# Patient Record
Sex: Female | Born: 1940
Health system: Southern US, Community
[De-identification: ages and names within clinical notes are randomized; demographics above are authoritative.]

## PROBLEM LIST (undated history)

## (undated) DIAGNOSIS — E7800 Pure hypercholesterolemia, unspecified: Secondary | ICD-10-CM

## (undated) DIAGNOSIS — IMO0002 Reserved for concepts with insufficient information to code with codable children: Secondary | ICD-10-CM

## (undated) DIAGNOSIS — C50919 Malignant neoplasm of unspecified site of unspecified female breast: Secondary | ICD-10-CM

## (undated) DIAGNOSIS — R011 Cardiac murmur, unspecified: Secondary | ICD-10-CM

## (undated) DIAGNOSIS — C50419 Malignant neoplasm of upper-outer quadrant of unspecified female breast: Secondary | ICD-10-CM

## (undated) DIAGNOSIS — N63 Unspecified lump in unspecified breast: Secondary | ICD-10-CM

## (undated) DIAGNOSIS — M810 Age-related osteoporosis without current pathological fracture: Secondary | ICD-10-CM

## (undated) HISTORY — DX: Malignant neoplasm of upper-outer quadrant of unspecified female breast: C50.419

## (undated) HISTORY — DX: Unspecified lump in unspecified breast: N63.0

## (undated) HISTORY — DX: Reserved for concepts with insufficient information to code with codable children: IMO0002

## (undated) HISTORY — DX: Cardiac murmur, unspecified: R01.1

---

## 1972-10-27 DIAGNOSIS — IMO0002 Reserved for concepts with insufficient information to code with codable children: Secondary | ICD-10-CM

## 1972-10-27 HISTORY — DX: Reserved for concepts with insufficient information to code with codable children: IMO0002

## 1988-10-27 DIAGNOSIS — R011 Cardiac murmur, unspecified: Secondary | ICD-10-CM

## 1988-10-27 HISTORY — DX: Cardiac murmur, unspecified: R01.1

## 2005-12-12 ENCOUNTER — Emergency Department: Payer: Self-pay | Admitting: Emergency Medicine

## 2006-02-10 ENCOUNTER — Ambulatory Visit: Payer: Self-pay | Admitting: Family Medicine

## 2006-02-17 ENCOUNTER — Ambulatory Visit: Payer: Self-pay | Admitting: Family Medicine

## 2008-03-29 ENCOUNTER — Ambulatory Visit: Payer: Self-pay | Admitting: Family Medicine

## 2008-04-04 ENCOUNTER — Ambulatory Visit: Payer: Self-pay | Admitting: Family Medicine

## 2009-10-27 HISTORY — PX: FRACTURE SURGERY: SHX138

## 2010-07-14 ENCOUNTER — Emergency Department: Payer: Self-pay | Admitting: Emergency Medicine

## 2010-07-15 ENCOUNTER — Ambulatory Visit: Payer: Self-pay | Admitting: Unknown Physician Specialty

## 2010-07-16 ENCOUNTER — Ambulatory Visit: Payer: Self-pay | Admitting: Unknown Physician Specialty

## 2010-10-02 ENCOUNTER — Ambulatory Visit: Payer: Self-pay | Admitting: Family Medicine

## 2010-12-20 ENCOUNTER — Emergency Department: Payer: Self-pay | Admitting: Emergency Medicine

## 2011-10-28 DIAGNOSIS — C50919 Malignant neoplasm of unspecified site of unspecified female breast: Secondary | ICD-10-CM

## 2011-10-28 DIAGNOSIS — C50419 Malignant neoplasm of upper-outer quadrant of unspecified female breast: Secondary | ICD-10-CM

## 2011-10-28 HISTORY — DX: Malignant neoplasm of unspecified site of unspecified female breast: C50.919

## 2011-10-28 HISTORY — PX: BREAST SURGERY: SHX581

## 2011-10-28 HISTORY — DX: Malignant neoplasm of upper-outer quadrant of unspecified female breast: C50.419

## 2011-10-28 HISTORY — PX: MASTECTOMY: SHX3

## 2011-10-28 HISTORY — PX: OTHER SURGICAL HISTORY: SHX169

## 2012-04-15 ENCOUNTER — Ambulatory Visit: Payer: Self-pay | Admitting: Family Medicine

## 2012-04-27 ENCOUNTER — Ambulatory Visit: Payer: Self-pay | Admitting: Oncology

## 2012-05-14 DIAGNOSIS — Z170 Estrogen receptor positive status [ER+]: Secondary | ICD-10-CM | POA: Insufficient documentation

## 2012-05-27 ENCOUNTER — Ambulatory Visit: Payer: Self-pay | Admitting: General Surgery

## 2012-05-27 DIAGNOSIS — C50911 Malignant neoplasm of unspecified site of right female breast: Secondary | ICD-10-CM | POA: Insufficient documentation

## 2012-05-31 LAB — PATHOLOGY REPORT

## 2012-07-07 ENCOUNTER — Ambulatory Visit: Payer: Self-pay | Admitting: Oncology

## 2012-07-27 ENCOUNTER — Ambulatory Visit: Payer: Self-pay | Admitting: Oncology

## 2012-08-27 ENCOUNTER — Ambulatory Visit: Payer: Self-pay | Admitting: Oncology

## 2013-01-25 ENCOUNTER — Ambulatory Visit: Payer: Self-pay | Admitting: Oncology

## 2013-02-02 LAB — COMPREHENSIVE METABOLIC PANEL
Albumin: 4 g/dL (ref 3.4–5.0)
BUN: 14 mg/dL (ref 7–18)
Calcium, Total: 8.6 mg/dL (ref 8.5–10.1)
Chloride: 102 mmol/L (ref 98–107)
Creatinine: 0.9 mg/dL (ref 0.60–1.30)
EGFR (African American): >60
EGFR (Non-African Amer.): >60
Glucose: 95 mg/dL (ref 65–99)
Osmolality: 278 (ref 275–301)
SGOT(AST): 15 U/L (ref 15–37)
Total Protein: 7.3 g/dL (ref 6.4–8.2)

## 2013-02-02 LAB — CBC CANCER CENTER
Basophil %: 0.8 %
Eosinophil %: 1 %
HCT: 40.3 % (ref 35.0–47.0)
Lymphocyte %: 17.7 %
MCHC: 33.7 g/dL (ref 32.0–36.0)
MCV: 92 fL (ref 80–100)
Monocyte #: 0.6 x10 3/mm (ref 0.2–0.9)
Monocyte %: 8.8 %
Neutrophil #: 4.9 x10 3/mm (ref 1.4–6.5)
Neutrophil %: 71.7 %
Platelet: 220 x10 3/mm (ref 150–440)
RBC: 4.37 10*6/uL (ref 3.80–5.20)
RDW: 13.1 % (ref 11.5–14.5)
WBC: 6.9 x10 3/mm (ref 3.6–11.0)

## 2013-02-03 LAB — CANCER ANTIGEN 27.29: CA 27.29: 14.3 U/mL (ref 0.0–38.6)

## 2013-02-24 ENCOUNTER — Ambulatory Visit: Payer: Self-pay | Admitting: Oncology

## 2013-04-15 ENCOUNTER — Encounter: Payer: Self-pay | Admitting: General Surgery

## 2013-04-18 ENCOUNTER — Ambulatory Visit: Payer: Self-pay | Admitting: General Surgery

## 2013-04-19 ENCOUNTER — Encounter: Payer: Self-pay | Admitting: General Surgery

## 2013-04-26 ENCOUNTER — Ambulatory Visit: Payer: Self-pay | Admitting: General Surgery

## 2013-05-12 ENCOUNTER — Ambulatory Visit: Payer: Self-pay | Admitting: General Surgery

## 2013-05-18 ENCOUNTER — Encounter: Payer: Self-pay | Admitting: General Surgery

## 2013-05-18 ENCOUNTER — Ambulatory Visit (INDEPENDENT_AMBULATORY_CARE_PROVIDER_SITE_OTHER): Payer: Medicare Other | Admitting: General Surgery

## 2013-05-18 VITALS — BP 110/70 | HR 64 | Resp 12 | Ht 62.0 in | Wt 127.0 lb

## 2013-05-18 DIAGNOSIS — Z853 Personal history of malignant neoplasm of breast: Secondary | ICD-10-CM

## 2013-05-18 NOTE — Progress Notes (Addendum)
Patient ID: Rebecca Faulkner, female   DOB: 09-28-1941, 72 y.o.   MRN: [id number]  Chief Complaint  Patient presents with  . Other    mammogram    HPI Rebecca Faulkner is a 72 y.o. female. who presents for a breast evaluation. She is 25yr post right mastectomy, currently on Letrazole. The most recent mammogram was done on 04/19/13 cat 1 Patient does perform regular self breast checks and gets regular mammograms done.    HPI  Past Medical History  Diagnosis Date  . Ulcer 1974  . Heart murmur 1990  . Lump or mass in breast   . Malignant neoplasm of upper-outer quadrant of female breast 2013    R-breast    Past Surgical History  Procedure Laterality Date  . Right inguinal hernia repair  2013  . Mastectomy Right 2013  . Fracture surgery Left 2011    arm, plate and 12 screws  . Cesarean section  1969  . Breast surgery Right 2013    History reviewed. No pertinent family history.  Social History History  Substance Use Topics  . Smoking status: Never Smoker   . Smokeless tobacco: Never Used  . Alcohol Use: 25.2 oz/week    42 Cans of beer per week    Allergies  Allergen Reactions  . Codeine Nausea Only    Current Outpatient Prescriptions  Medication Sig Dispense Refill  . calcium carbonate (OS-CAL) 600 MG TABS Take 600 mg by mouth 2 (two) times daily with a meal.      . letrozole (FEMARA) 2.5 MG tablet Take 1 tablet by mouth daily.      Marland Kitchen lidocaine (LIDODERM) 5 % Place 1 patch onto the skin daily. Remove & Discard patch within 12 hours or as directed by MD      . timolol (BETIMOL) 0.5 % ophthalmic solution Place 1 drop into both eyes 2 (two) times daily.       No current facility-administered medications for this visit.    Review of Systems Review of Systems  Constitutional: Negative.   Respiratory: Negative.   Cardiovascular: Negative.     Blood pressure 110/70, pulse 64, resp. rate 12, height 5\' 2"  (1.575 m), weight 127 lb (57.607 kg).  Physical Exam Physical Exam   Constitutional: She is oriented to person, place, and time. She appears well-developed and well-nourished.  Eyes: No scleral icterus.  Neck: No tracheal deviation present. No mass and no thyromegaly present.  Cardiovascular: Normal rate, regular rhythm, intact distal pulses and normal pulses.   Pulses:      Dorsalis pedis pulses are 2+ on the right side, and 2+ on the left side.       Posterior tibial pulses are 2+ on the right side, and 2+ on the left side.  No edema   Pulmonary/Chest: Breath sounds normal. Left breast exhibits no inverted nipple, no mass, no nipple discharge, no skin change and no tenderness.  Right mastectomy site well healed, no sign of recurrent.  Lymphadenopathy:    She has no cervical adenopathy.       Right axillary: No pectoral and no lateral adenopathy present.       Left axillary: No pectoral and no lateral adenopathy present. Neurological: She is alert and oriented to person, place, and time.  Skin: Skin is dry.    Data Reviewed Mammogram reviewed  Assessment    Stable exam     Plan   Patient to return in one year with left breast mammogram.  SANKAR,SEEPLAPUTHUR G 05/18/2013, 6:59 PM

## 2013-05-18 NOTE — Patient Instructions (Addendum)
Patient to return in one year. Continue with monthly self breast exam.

## 2013-08-11 ENCOUNTER — Ambulatory Visit: Payer: Self-pay | Admitting: Oncology

## 2013-08-11 LAB — CBC CANCER CENTER
Basophil #: 0.1 x10 3/mm (ref 0.0–0.1)
Basophil %: 1.2 %
Eosinophil #: 0.1 x10 3/mm (ref 0.0–0.7)
Eosinophil %: 2.1 %
HCT: 41 % (ref 35.0–47.0)
HGB: 13.9 g/dL (ref 12.0–16.0)
Lymphocyte #: 1.2 x10 3/mm (ref 1.0–3.6)
Lymphocyte %: 24.2 %
MCHC: 34 g/dL (ref 32.0–36.0)
Monocyte #: 0.6 x10 3/mm (ref 0.2–0.9)
Neutrophil #: 3 x10 3/mm (ref 1.4–6.5)
Platelet: 228 x10 3/mm (ref 150–440)

## 2013-08-11 LAB — COMPREHENSIVE METABOLIC PANEL
Albumin: 3.9 g/dL (ref 3.4–5.0)
Alkaline Phosphatase: 75 U/L (ref 50–136)
BUN: 14 mg/dL (ref 7–18)
Bilirubin,Total: 0.5 mg/dL (ref 0.2–1.0)
Calcium, Total: 8.6 mg/dL (ref 8.5–10.1)
Chloride: 104 mmol/L (ref 98–107)
Co2: 28 mmol/L (ref 21–32)
Creatinine: 0.72 mg/dL (ref 0.60–1.30)
EGFR (African American): >60
EGFR (Non-African Amer.): >60
Glucose: 105 mg/dL — ABNORMAL HIGH (ref 65–99)
Osmolality: 282 (ref 275–301)
Potassium: 4.7 mmol/L (ref 3.5–5.1)
SGOT(AST): 16 U/L (ref 15–37)
Sodium: 141 mmol/L (ref 136–145)
Total Protein: 7.2 g/dL (ref 6.4–8.2)

## 2013-08-12 LAB — CANCER ANTIGEN 27.29: CA 27.29: 13.9 U/mL (ref 0.0–38.6)

## 2013-08-27 ENCOUNTER — Ambulatory Visit: Payer: Self-pay | Admitting: Oncology

## 2013-10-27 HISTORY — PX: VEIN SURGERY: SHX48

## 2013-11-10 ENCOUNTER — Emergency Department: Payer: Self-pay | Admitting: Emergency Medicine

## 2013-11-10 LAB — URINALYSIS, COMPLETE
BACTERIA: NONE SEEN
BILIRUBIN, UR: NEGATIVE
Blood: NEGATIVE
Glucose,UR: NEGATIVE mg/dL (ref 0–75)
Leukocyte Esterase: NEGATIVE
Nitrite: NEGATIVE
PH: 7 (ref 4.5–8.0)
Protein: NEGATIVE
RBC,UR: 4 /HPF (ref 0–5)
SPECIFIC GRAVITY: 1.014 (ref 1.003–1.030)

## 2013-11-10 LAB — CBC WITH DIFFERENTIAL/PLATELET
Basophil #: 0 10*3/uL (ref 0.0–0.1)
Basophil %: 0.9 %
Eosinophil #: 0 10*3/uL (ref 0.0–0.7)
Eosinophil %: 0.1 %
HCT: 39.6 % (ref 35.0–47.0)
HGB: 13.8 g/dL (ref 12.0–16.0)
LYMPHS ABS: 0.2 10*3/uL — AB (ref 1.0–3.6)
LYMPHS PCT: 4.1 %
MCH: 31.6 pg (ref 26.0–34.0)
MCHC: 34.9 g/dL (ref 32.0–36.0)
MCV: 91 fL (ref 80–100)
MONOS PCT: 10.1 %
Monocyte #: 0.5 x10 3/mm (ref 0.2–0.9)
Neutrophil #: 3.8 10*3/uL (ref 1.4–6.5)
Neutrophil %: 84.8 %
PLATELETS: 178 10*3/uL (ref 150–440)
RBC: 4.38 10*6/uL (ref 3.80–5.20)
RDW: 13.2 % (ref 11.5–14.5)
WBC: 4.4 10*3/uL (ref 3.6–11.0)

## 2013-11-10 LAB — COMPREHENSIVE METABOLIC PANEL
ALBUMIN: 4 g/dL (ref 3.4–5.0)
Alkaline Phosphatase: 61 U/L
Anion Gap: 8 (ref 7–16)
BUN: 9 mg/dL (ref 7–18)
Bilirubin,Total: 0.4 mg/dL (ref 0.2–1.0)
CALCIUM: 9.1 mg/dL (ref 8.5–10.1)
CHLORIDE: 105 mmol/L (ref 98–107)
CREATININE: 0.55 mg/dL — AB (ref 0.60–1.30)
Co2: 24 mmol/L (ref 21–32)
EGFR (African American): >60
Glucose: 115 mg/dL — ABNORMAL HIGH (ref 65–99)
Osmolality: 273 (ref 275–301)
POTASSIUM: 3.8 mmol/L (ref 3.5–5.1)
SGOT(AST): 13 U/L — ABNORMAL LOW (ref 15–37)
SGPT (ALT): 16 U/L (ref 12–78)
Sodium: 137 mmol/L (ref 136–145)
Total Protein: 7.3 g/dL (ref 6.4–8.2)

## 2013-11-10 LAB — LIPASE, BLOOD: Lipase: 149 U/L (ref 73–393)

## 2013-11-10 LAB — TROPONIN I

## 2014-02-08 ENCOUNTER — Ambulatory Visit: Payer: Self-pay | Admitting: Oncology

## 2014-02-20 LAB — COMPREHENSIVE METABOLIC PANEL
ALK PHOS: 74 U/L
ALT: 16 U/L (ref 12–78)
ANION GAP: 8 (ref 7–16)
Albumin: 4.1 g/dL (ref 3.4–5.0)
BILIRUBIN TOTAL: 0.3 mg/dL (ref 0.2–1.0)
BUN: 15 mg/dL (ref 7–18)
CHLORIDE: 102 mmol/L (ref 98–107)
CREATININE: 0.75 mg/dL (ref 0.60–1.30)
Calcium, Total: 10.2 mg/dL — ABNORMAL HIGH (ref 8.5–10.1)
Co2: 29 mmol/L (ref 21–32)
EGFR (African American): >60
GLUCOSE: 97 mg/dL (ref 65–99)
OSMOLALITY: 278 (ref 275–301)
POTASSIUM: 4.5 mmol/L (ref 3.5–5.1)
SGOT(AST): 13 U/L — ABNORMAL LOW (ref 15–37)
Sodium: 139 mmol/L (ref 136–145)
Total Protein: 7.5 g/dL (ref 6.4–8.2)

## 2014-02-20 LAB — CBC CANCER CENTER
Basophil #: 0.1 x10 3/mm (ref 0.0–0.1)
Basophil %: 0.9 %
Eosinophil #: 0.1 x10 3/mm (ref 0.0–0.7)
Eosinophil %: 0.9 %
HCT: 40.5 % (ref 35.0–47.0)
HGB: 13.5 g/dL (ref 12.0–16.0)
Lymphocyte #: 1.2 x10 3/mm (ref 1.0–3.6)
Lymphocyte %: 20.7 %
MCH: 30.7 pg (ref 26.0–34.0)
MCHC: 33.4 g/dL (ref 32.0–36.0)
MCV: 92 fL (ref 80–100)
MONO ABS: 0.5 x10 3/mm (ref 0.2–0.9)
Monocyte %: 8.4 %
Neutrophil #: 3.9 x10 3/mm (ref 1.4–6.5)
Neutrophil %: 69.1 %
PLATELETS: 257 x10 3/mm (ref 150–440)
RBC: 4.41 10*6/uL (ref 3.80–5.20)
RDW: 13.7 % (ref 11.5–14.5)
WBC: 5.7 x10 3/mm (ref 3.6–11.0)

## 2014-02-21 LAB — CANCER ANTIGEN 27.29: CA 27.29: 10.1 U/mL (ref 0.0–38.6)

## 2014-02-24 ENCOUNTER — Ambulatory Visit: Payer: Self-pay | Admitting: Oncology

## 2014-03-27 ENCOUNTER — Ambulatory Visit: Payer: Self-pay | Admitting: Family Medicine

## 2014-03-27 DIAGNOSIS — I517 Cardiomegaly: Secondary | ICD-10-CM

## 2014-03-28 ENCOUNTER — Encounter: Payer: Self-pay | Admitting: Cardiovascular Disease

## 2014-04-19 ENCOUNTER — Ambulatory Visit: Payer: Self-pay | Admitting: Oncology

## 2014-05-02 ENCOUNTER — Encounter: Payer: Self-pay | Admitting: General Surgery

## 2014-05-22 ENCOUNTER — Ambulatory Visit: Payer: Medicare Other | Admitting: General Surgery

## 2014-05-30 ENCOUNTER — Encounter: Payer: Self-pay | Admitting: General Surgery

## 2014-05-30 ENCOUNTER — Other Ambulatory Visit: Payer: Medicare Other

## 2014-05-30 ENCOUNTER — Ambulatory Visit (INDEPENDENT_AMBULATORY_CARE_PROVIDER_SITE_OTHER): Payer: Medicare Other | Admitting: General Surgery

## 2014-05-30 VITALS — BP 130/72 | HR 66 | Resp 12 | Ht 62.5 in | Wt 118.0 lb

## 2014-05-30 DIAGNOSIS — Z853 Personal history of malignant neoplasm of breast: Secondary | ICD-10-CM

## 2014-05-30 NOTE — Patient Instructions (Signed)
Patient to return in 3 months for follow up. Continue self breast exams. Call office for any new breast issues or concerns.  

## 2014-05-30 NOTE — Progress Notes (Signed)
Patient ID: Rebecca Faulkner, female   DOB: 1941/09/26, 73 y.o.   MRN: [id number]  Chief Complaint  Patient presents with  . Follow-up    1 year left mammogram    HPI Rebecca Faulkner is a 73 y.o. female who presents for breast cancer follow up. The most recent mammogram was done on 04/19/14. Patient does perform regular self breast checks and gets regular mammograms done. No complaints at this time.    HPI  Past Medical History  Diagnosis Date  . Ulcer 1974  . Heart murmur 1990  . Lump or mass in breast   . Malignant neoplasm of upper-outer quadrant of female breast 2013    R-breast T1, NO, MO, ER/PR pos., Her 2 neg.    Past Surgical History  Procedure Laterality Date  . Right inguinal hernia repair  2013  . Mastectomy Right 2013  . Fracture surgery Left 2011    arm, plate and 12 screws  . Cesarean section  1969  . Breast surgery Right 2013  . Vein surgery Left 2015    History reviewed. No pertinent family history.  Social History History  Substance Use Topics  . Smoking status: Never Smoker   . Smokeless tobacco: Never Used  . Alcohol Use: 25.2 oz/week    42 Cans of beer per week    Allergies  Allergen Reactions  . Codeine Nausea Only    Current Outpatient Prescriptions  Medication Sig Dispense Refill  . calcium carbonate (OS-CAL) 600 MG TABS Take 600 mg by mouth 2 (two) times daily with a meal.      . letrozole (FEMARA) 2.5 MG tablet Take 1 tablet by mouth daily.      Marland Kitchen lidocaine (LIDODERM) 5 % Place 1 patch onto the skin daily. Remove & Discard patch within 12 hours or as directed by MD      . timolol (BETIMOL) 0.5 % ophthalmic solution Place 1 drop into both eyes 2 (two) times daily.       No current facility-administered medications for this visit.    Review of Systems Review of Systems  Blood pressure 130/72, pulse 66, resp. rate 12, height 5' 2.5" (1.588 m), weight 118 lb (53.524 kg).  Physical Exam Physical Exam  Constitutional: She is oriented  to person, place, and time. She appears well-developed and well-nourished.  Neck: Neck supple. No thyromegaly present.  Cardiovascular: Normal rate, regular rhythm and normal heart sounds.   No murmur heard. Pulmonary/Chest: Effort normal and breath sounds normal. Left breast exhibits no inverted nipple, no mass, no nipple discharge, no skin change and no tenderness.  Right mastectomy site well healed no signs of reoccurrence.   Lymphadenopathy:    She has no cervical adenopathy.    She has axillary adenopathy.       Right axillary: Pectoral adenopathy present. No lateral adenopathy present.       Left axillary: No pectoral and no lateral adenopathy present. 1.5 cm mobile node in the right axillary.   Neurological: She is alert and oriented to person, place, and time.  Skin: Skin is warm and dry.    Data Reviewed Mammogram report-stable left mammogram  Assessment    2 yrs post right mastectomy/SN biopsy for T1,N0 CA, Er/PR pos, Her 2 neg. Now on Letrazole. New small palpable node in right axila. The ultrasound of the axilla shows a benign appearing lymph node.     Plan    Likely a benign finding in the right axilla. Given the  previous history of right breast cancer recommend 3 month follow up. Discussed fully with pt.       SANKAR,SEEPLAPUTHUR G 05/31/2014, 2:07 PM

## 2014-05-31 ENCOUNTER — Encounter: Payer: Self-pay | Admitting: General Surgery

## 2014-07-18 ENCOUNTER — Ambulatory Visit: Payer: Medicare Other | Admitting: General Surgery

## 2014-08-08 ENCOUNTER — Ambulatory Visit: Payer: Self-pay | Admitting: Family Medicine

## 2014-08-22 ENCOUNTER — Ambulatory Visit: Payer: Self-pay | Admitting: Oncology

## 2014-08-22 LAB — COMPREHENSIVE METABOLIC PANEL
ALBUMIN: 3.9 g/dL (ref 3.4–5.0)
AST: 24 U/L (ref 15–37)
Alkaline Phosphatase: 79 U/L
Anion Gap: 7 (ref 7–16)
BUN: 11 mg/dL (ref 7–18)
Bilirubin,Total: 0.3 mg/dL (ref 0.2–1.0)
CALCIUM: 9.1 mg/dL (ref 8.5–10.1)
Chloride: 102 mmol/L (ref 98–107)
Co2: 28 mmol/L (ref 21–32)
Creatinine: 0.57 mg/dL — ABNORMAL LOW (ref 0.60–1.30)
EGFR (Non-African Amer.): >60
Glucose: 99 mg/dL (ref 65–99)
OSMOLALITY: 273 (ref 275–301)
Potassium: 4 mmol/L (ref 3.5–5.1)
SGPT (ALT): 18 U/L
SODIUM: 137 mmol/L (ref 136–145)
TOTAL PROTEIN: 7.4 g/dL (ref 6.4–8.2)

## 2014-08-22 LAB — CBC CANCER CENTER
BASOS ABS: 0.1 x10 3/mm (ref 0.0–0.1)
Basophil %: 1 %
Eosinophil #: 0.1 x10 3/mm (ref 0.0–0.7)
Eosinophil %: 1.1 %
HCT: 41 % (ref 35.0–47.0)
HGB: 13.6 g/dL (ref 12.0–16.0)
LYMPHS PCT: 28.6 %
Lymphocyte #: 1.7 x10 3/mm (ref 1.0–3.6)
MCH: 30.9 pg (ref 26.0–34.0)
MCHC: 33.2 g/dL (ref 32.0–36.0)
MCV: 93 fL (ref 80–100)
MONO ABS: 0.7 x10 3/mm (ref 0.2–0.9)
MONOS PCT: 11.6 %
Neutrophil #: 3.4 x10 3/mm (ref 1.4–6.5)
Neutrophil %: 57.7 %
Platelet: 254 x10 3/mm (ref 150–440)
RBC: 4.4 10*6/uL (ref 3.80–5.20)
RDW: 13.3 % (ref 11.5–14.5)
WBC: 5.8 x10 3/mm (ref 3.6–11.0)

## 2014-08-23 LAB — CANCER ANTIGEN 27.29: CA 27.29: 14.3 U/mL (ref 0.0–38.6)

## 2014-08-27 ENCOUNTER — Ambulatory Visit: Payer: Self-pay | Admitting: Oncology

## 2014-08-28 ENCOUNTER — Encounter: Payer: Self-pay | Admitting: General Surgery

## 2014-08-29 ENCOUNTER — Ambulatory Visit (INDEPENDENT_AMBULATORY_CARE_PROVIDER_SITE_OTHER): Payer: Medicare Other | Admitting: General Surgery

## 2014-08-29 ENCOUNTER — Other Ambulatory Visit: Payer: Medicare Other

## 2014-08-29 ENCOUNTER — Encounter: Payer: Self-pay | Admitting: General Surgery

## 2014-08-29 VITALS — BP 124/82 | HR 70 | Resp 14 | Ht 61.0 in | Wt 122.0 lb

## 2014-08-29 DIAGNOSIS — R590 Localized enlarged lymph nodes: Secondary | ICD-10-CM

## 2014-08-29 DIAGNOSIS — C50411 Malignant neoplasm of upper-outer quadrant of right female breast: Secondary | ICD-10-CM

## 2014-08-29 NOTE — Patient Instructions (Signed)
Colonoscopy  A colonoscopy is an exam to look at the entire large intestine (colon). This exam can help find problems such as tumors, polyps, inflammation, and areas of bleeding. The exam takes about 1 hour.   LET YOUR HEALTH CARE PROVIDER KNOW ABOUT:   · Any allergies you have.  · All medicines you are taking, including vitamins, herbs, eye drops, creams, and over-the-counter medicines.  · Previous problems you or members of your family have had with the use of anesthetics.  · Any blood disorders you have.  · Previous surgeries you have had.  · Medical conditions you have.  RISKS AND COMPLICATIONS   Generally, this is a safe procedure. However, as with any procedure, complications can occur. Possible complications include:  · Bleeding.  · Tearing or rupture of the colon wall.  · Reaction to medicines given during the exam.  · Infection (rare).  BEFORE THE PROCEDURE   · Ask your health care provider about changing or stopping your regular medicines.  · You may be prescribed an oral bowel prep. This involves drinking a large amount of medicated liquid, starting the day before your procedure. The liquid will cause you to have multiple loose stools until your stool is almost clear or light green. This cleans out your colon in preparation for the procedure.  · Do not eat or drink anything else once you have started the bowel prep, unless your health care provider tells you it is safe to do so.  · Arrange for someone to drive you home after the procedure.  PROCEDURE   · You will be given medicine to help you relax (sedative).  · You will lie on your side with your knees bent.  · A long, flexible tube with a light and camera on the end (colonoscope) will be inserted through the rectum and into the colon. The camera sends video back to a computer screen as it moves through the colon. The colonoscope also releases carbon dioxide gas to inflate the colon. This helps your health care provider see the area better.  · During  the exam, your health care provider may take a small tissue sample (biopsy) to be examined under a microscope if any abnormalities are found.  · The exam is finished when the entire colon has been viewed.  AFTER THE PROCEDURE   · Do not drive for 24 hours after the exam.  · You may have a small amount of blood in your stool.  · You may pass moderate amounts of gas and have mild abdominal cramping or bloating. This is caused by the gas used to inflate your colon during the exam.  · Ask when your test results will be ready and how you will get your results. Make sure you get your test results.  Document Released: 10/10/2000 Document Revised: 08/03/2013 Document Reviewed: 06/20/2013  [employer]® Patient Information ©2015 [employer]. This information is not intended to replace advice given to you by your health care provider. Make sure you discuss any questions you have with your health care provider.

## 2014-08-29 NOTE — Progress Notes (Signed)
Patient ID: Rebecca Faulkner, female   DOB: [birth year], 73 y.o.   MRN: [id number]  Chief Complaint  Patient presents with  . Follow-up    HPI Rebecca Faulkner is a 73 y.o. female.  Follow up right axillary node and discuss having a colonoscopy. No new breast issues and she wants to wait and do the colonoscopy next year. No colonoscopy prior. Denies family history of colon cancer.   History of right breast mastectomy 2013.  HPI  Past Medical History  Diagnosis Date  . Ulcer 1974  . Heart murmur 1990  . Lump or mass in breast   . Malignant neoplasm of upper-outer quadrant of female breast 2013    R-breast T1, NO, MO, ER/PR pos., Her 2 neg.    Past Surgical History  Procedure Laterality Date  . Right inguinal hernia repair  2013  . Mastectomy Right 2013  . Fracture surgery Left 2011    arm, plate and 12 screws  . Cesarean section  1969  . Breast surgery Right 2013  . Vein surgery Left 2015    No family history on file.  Social History History  Substance Use Topics  . Smoking status: Never Smoker   . Smokeless tobacco: Never Used  . Alcohol Use: 25.2 oz/week    42 Cans of beer per week    Allergies  Allergen Reactions  . Codeine Nausea Only    Current Outpatient Prescriptions  Medication Sig Dispense Refill  . alendronate (FOSAMAX) 70 MG tablet Take 70 mg by mouth once a week.   0  . calcium carbonate (OS-CAL) 600 MG TABS Take 600 mg by mouth 2 (two) times daily with a meal.    . latanoprost (XALATAN) 0.005 % ophthalmic solution Place 1 drop into both eyes at bedtime.   2  . letrozole (FEMARA) 2.5 MG tablet Take 1 tablet by mouth daily.    Marland Kitchen lidocaine (LIDODERM) 5 % Place 1 patch onto the skin daily. Remove & Discard patch within 12 hours or as directed by MD    . timolol (BETIMOL) 0.5 % ophthalmic solution Place 1 drop into both eyes daily.      No current facility-administered medications for this visit.    Review of Systems Review of Systems  Constitutional:  Negative.   Respiratory: Negative.   Cardiovascular: Negative.     Blood pressure 124/82, pulse 70, resp. rate 14, height 5\' 1"  (1.549 m), weight 122 lb (55.339 kg).  Physical Exam Physical Exam  Constitutional: She is oriented to person, place, and time. She appears well-developed and well-nourished.  Eyes: Conjunctivae are normal. No scleral icterus.  Neck: Neck supple.  Pulmonary/Chest: Left breast exhibits no inverted nipple, no mass, no nipple discharge, no skin change and no tenderness.  Well healed right mastectomy site  Lymphadenopathy:    She has no cervical adenopathy.  1.5 cm size sub pectoral node on right, unchanged.  Neurological: She is alert and oriented to person, place, and time.  Skin: Skin is warm and dry.    Data Reviewed Prior US . Repeated today. The lymph node appears basically unchanged to slightly smaller  Assessment    Right  Breat cancer, now with a small right axillary node-appears benign and stable in size.     Plan     Follow up in August next year left diagnostic mammogram and office visit.        Merlyn Bollen G 08/29/2014, 7:58 PM

## 2015-01-02 ENCOUNTER — Inpatient Hospital Stay: Payer: Self-pay | Admitting: Internal Medicine

## 2015-01-02 HISTORY — PX: LEG SURGERY: SHX1003

## 2015-01-04 ENCOUNTER — Encounter
Admit: 2015-01-04 | Disposition: A | Discharge status: Home / Self Care | Payer: Self-pay | Attending: Internal Medicine | Admitting: Internal Medicine

## 2015-01-26 ENCOUNTER — Encounter
Admit: 2015-01-26 | Disposition: A | Discharge status: Home / Self Care | Payer: Self-pay | Attending: Internal Medicine | Admitting: Internal Medicine

## 2015-02-13 NOTE — Op Note (Signed)
PATIENT NAME:  Rebecca Faulkner, DAS MR#:  [id number] DATE OF BIRTH:  [birth year]  DATE OF PROCEDURE:  05/27/2012  PREOPERATIVE DIAGNOSIS: Carcinoma, right breast.   POSTOPERATIVE DIAGNOSIS: Carcinoma, right breast.   OPERATION: Right total mastectomy with sentinel node biopsy.   SURGEON: Mckinley Jewel, MD    ANESTHESIA: General.   COMPLICATIONS: None.   ESTIMATED BLOOD LOSS: Approximately 50 to 75 mL.   DRAINS: Jackson-Pratt drain.   PROCEDURE: The patient was put to sleep in the supine position on the operating table. The right breast and axilla were prepped and draped out as a sterile field. Preoperatively the patient had nuclear contrast injected for sentinel node identification. The gamma finder was used to locate a focal area of signal activity in the inferior portion of the axilla near the axillary tail of the right breast. An elliptical skin incision was then mapped out and the superior incision was then made and the skin elevated upward to expose the axillary contents first. Using the gamma finder, a single less than 1 cm size node was identified with intense signal activity. This was removed and sent for frozen section which did not reveal any gross metastatic disease. There were no other visible nodes and no signal activity in the axillary fat pad was identified after removal of the sentinel node. A superior flap was then elevated completed all the way to the infraclavicular region. Inferiorly incision was made and the inferior flap created to the upper rectus fascia and breast tissue along with the pectoral fascia was removed from the medial to the lateral aspect overlying the pectoral muscle. The lateral edges were then freed and removed. The lateral end of the breast was tagged for identification and orientation. The bleeding was controlled with cautery and ligatures of 3-0 Vicryl. Jackson-Pratt drain was positioned across the wound and brought out through a stab incision inferiorly and  fastened to the skin with a nylon stitch. The skin and subcutaneous tissue was approximated with two running sutures of 2-0 Vicryl. The skin was covered with Dermabond. Fluffs were then placed and held in position with the use of a surgical bra. The procedure was well tolerated. The patient subsequently was returned to the recovery room in stable condition.   ____________________________ S.Robinette Haines, MD sgs:drc D: 05/28/2012 12:12:34 ET T: 05/28/2012 12:45:44 ET JOB#: [id number]  cc: Synthia Innocent. Jamal Collin, MD, <Dictator> [facility] Robinette Haines MD ELECTRONICALLY SIGNED 05/28/2012 14:47

## 2015-02-25 NOTE — H&P (Signed)
PATIENT NAME:  Rebecca Faulkner, AGRESTI MR#:  [id number] DATE OF BIRTH:  13-Oct-1941  DATE OF ADMISSION:  01/02/2015  PRIMARY CARE PHYSICIAN:   Steele Sizer, MD   REFERRING PHYSICIAN:  Hinda Kehr, MD   CHIEF COMPLAINT: Fall today and right hip pain.   HISTORY OF PRESENT ILLNESS:  A 74 year old Caucasian female with a history of breast cancer who presented to the ED with the above chief complaint. The patient fell at the workplace and had right hip pain, but the patient denies any syncope or loss of consciousness, denies any headache or dizziness, or any other part injury. The patient's x-ray showed right hip fracture. The patient denies any other symptoms.   PAST MEDICAL HISTORY: Left-sided breast cancer, status post left mastectomy.   SOCIAL HISTORY: No smoking or drinking or illicit drugs.   PAST SURGICAL HISTORY: Hernia repair, left mastectomy, surgery of a left proximal humerus, C-section.   FAMILY HISTORY: Brother has diabetes, no hypertension, or heart attack in her family, no cancer in her family.   ALLERGIES:  CODEINE.   HOME MEDICATIONS:  Xalatan 0.005% ophthalmic solution 1 drop to each effected eye once a day in the evening, Femara 2.5 mg 1 tablet at bedtime, calcium 600+ D 1 tablet once a day at bedtime, alendronate 70 mg p.o. once a week.   REVIEW OF SYSTEMS:  CONSTITUTIONAL: The patient denies any fever or chills. No headache or dizziness weakness.  EYES: No double vision or blurred vision.  EARS, NOSE, AND THROAT: No postnasal drip, slurred speech or dysphagia.  CARDIOVASCULAR: No chest pain, palpitation. No orthopnea or nocturnal dyspnea. No leg edema.  PULMONARY: No cough, sputum, shortness of breath, or hemoptysis.  GASTROINTESTINAL: No abdominal pain, nausea, vomiting, or diarrhea. No melena or bloody stool.  GENITOURINARY: No dysuria, hematuria, or incontinence.   SKIN: No rash or jaundice.  NEUROLOGY: No syncope, loss of consciousness, or seizure.  ENDOCRINE: No  polyuria, polydipsia, heat or cold intolerance.  HEMATOLOGY:  No easy bleeding or bleeding.   PHYSICAL EXAMINATION:  VITAL SIGNS: Temperature 98.7, blood pressure 142/76, pulse 65, oxygen saturation 100% on room air.  GENERAL: The patient is alert, awake, oriented, in no acute distress.  HEENT: Pupils round, equal, reactive to light and accommodation. Moist oral mucosa. Clear oropharynx.  NECK: Supple. No JVD or carotid bruit. No lymphadenopathy. No thyromegaly.  CARDIOVASCULAR: S1, S2 regular rate and rhythm. No murmurs or gallops.  PULMONARY: Bilateral air entry. No wheezing or rales. No use of accessory muscle to breathe.  ABDOMEN: Soft. No distention or tenderness. No organomegaly. Bowel sounds present.  EXTREMITIES: No edema, clubbing or cyanosis. No calf tenderness. Bilateral pedal pulses present, right lower extremity, left side rotator deformity, unable to move at all.  SKIN: No rash or jaundice.  NEUROLOGIC: A and O x 3. No focal intracranial deficit. No focal deficit.   LABORATORY DATA: Glucose 133, BUN 14, creatinine 0.51, sodium 131, potassium 3.6, chloride 98, bicarbonate 23. CBC normal.  Right femur x-ray shows comminuted right intertrochanteric femur fracture.  There is varus impaction and a displaced butterfly fragment of lesser trochanter.    Chest x-ray:  No active disease.  PTT 24.9.  Lipase is 31, magnesium 1.8. INR 0.9, troponin less than 0.03.   IMPRESSION:  1.  Right hip fracture.  2.  Hyponatremia.   3.  History of left breast cancer status post mastectomy.   PLAN OF TREATMENT:   1.  The patient will be admitted to orthopedic surgical floor  and we will request Dr. Mack Guise consult for possible surgery.  2.  The patient has low to moderate risk for hip fracture surgery.  The patient needs deep vein thrombosis prophylaxis after surgery.    3.  I advised the patient to take deep breaths after surgery. 4.  For hyponatremia, I will start normal saline and follow up a  BMP.  5.  I discussed the patient's condition and plan of treatment with the patient.  6.  The patient wants FULL CODE.    TIME SPENT: About 53 minutes.     ____________________________ Demetrios Loll, MD qc:DT D: 01/02/2015 16:11:20 ET T: 01/02/2015 16:56:23 ET JOB#: [id number]  cc: Demetrios Loll, MD, <Dictator> Demetrios Loll MD ELECTRONICALLY SIGNED 01/03/2015 16:18

## 2015-02-25 NOTE — Discharge Summary (Signed)
PATIENT NAME:  Rebecca Faulkner, Rebecca Faulkner MR#:  [id number] DATE OF BIRTH:  02/10/1941  DATE OF ADMISSION:  01/02/2015 DATE OF DISCHARGE:  01/06/2015  DISCHARGE DIAGNOSES: Right hip fracture.   CONDITION: Satisfactory.  CODE STATUS: Full code.   HOME MEDICATIONS: Please refer to the medication reconciliation list.   DIET: Low-sodium diet.   ACTIVITY: As tolerated.   FOLLOWUP CARE: Follow with PCP also within 1-2 weeks.   REASON FOR ADMISSION: Fall and right hip pain.   HOSPITAL COURSE: The patient is a 74 year old Caucasian female with a history of breast cancer, presented in the ED with a fall and right hip pain. For detailed history and physical examination, please refer to the admission note dictated by me. Chest x-ray was negative for active disease. The patient's right hip x-ray showed right hip fracture. After admission, Dr. Mack Guise did an internal fixation of her right hip fracture. The patient started physical therapy after surgery. In addition, the patient had Lovenox for deep vein thrombosis prophylaxis. The patient has no complaints. Her vital signs are stable. She is being discharged to a skilled nursing facility today. I discussed the patient's discharge plan with the patient, nurse, and social worker.   TIME SPENT: About 36 minutes     ____________________________ Demetrios Loll, MD qc:mw D: 01/06/2015 10:35:30 ET T: 01/06/2015 10:42:30 ET JOB#: [id number]  cc: Demetrios Loll, MD, <Dictator> Demetrios Loll MD ELECTRONICALLY SIGNED 01/06/2015 16:50

## 2015-02-25 NOTE — Op Note (Signed)
PATIENT NAME:  Rebecca Faulkner, Rebecca Faulkner MR#:  [id number] DATE OF BIRTH:  [birth year]  DATE OF PROCEDURE:  01/03/2015  PREOPERATIVE DIAGNOSIS: Comminuted right intertrochanteric hip fracture.   POSTOPERATIVE DIAGNOSIS: Comminuted right intertrochanteric hip fracture.   PROCEDURE: Intramedullary fixation of right intertrochanteric trochanteric hip fracture.   SURGEON: Timoteo Gaul, MD.  ANESTHESIA: Spinal.  SPECIMENS: None.   ESTIMATED BLOOD LOSS: 50 mL.   COMPLICATIONS: None.  IMPLANTS: Short Biomet Affixus 11 x 180 mm, 130-degree intramedullary rod with a 90-mm lag screw and 36-mm distal interlocking screw.   INDICATION FOR THE PROCEDURE: The patient is a 74 year old female who sustained a mechanical fall. She had a comminuted fracture of the right hip by x-ray upon presentation to the [facility] Emergency Department. The patient is an independent community ambulator at baseline and has a high level of functioning. For this reason, I had recommended intramedullary fixation for a right hip fracture. I reviewed the risks and benefits of surgery with the patient, which included infection, bleeding, nerve or blood vessel injury, malunion, nonunion, change in lower extremity rotation or leg length discrepancy. The patient may also have persistent hip pain, may develop osteoarthritis, and may require further surgery. Medical risks include, but are not limited to DVT and pulmonary embolism, myocardial infarction, stroke, pneumonia, respiratory failure and death. The patient understood these risks and signed the consent form. She also signed consent for blood transfusion if necessary. She was cleared by the hospitalist service for surgery.   PROCEDURE: The patient had her right hip marked with the word "yes" and my initials, according to the hospital's correct site of surgery protocol. She brought to the operating room where she underwent a spinal anesthetic. She was placed supine on the fracture  table. Her right leg was placed in a leg holder where gentle traction and internal rotation was applied. The left leg was placed in a hemi-lithotomy position. All bony prominences were adequately padded. Her right arm was placed over her chest and adequately padded as well. The patient was prepped and draped in a sterile fashion. A timeout was performed to verify the patient's name, date of birth, medical record number, correct site of surgery and correct procedure to be performed. It was also used to verify the patient had received the antibiotics and all appropriate instruments, implants, and radiographic studies were available in the room. Once all in attendance were in agreement, the case began.   C-arm images in the AP and lateral planes were used to confirm fracture reduction. Once adequate reduction was achieved, an incision was made in line with the femur superior to the tip of the greater trochanter. The deep fascia was incised in line with the femur with a deep #10 blade. The abductor muscle was split in line with its fibers, which allowed for palpation of the tip of the greater trochanter. A threaded drill pin was then inserted to into the tip of the greater trochanter and advanced into the proximal femur to the level of the lesser trochanter. The drill pin position was confirmed on AP and lateral C-arm images. This was then over-drilled with a proximal femoral drill. A long ball-tip guidewire was then inserted into the femoral canal. Its position was confirmed on AP and lateral C-arm images. Sequential reamers were then sent down the femur until the femur was dilated to 13 mm. This allowed for placement of an 11-mm short Affixus nail. This was slid into position over the ball-tip guidewire. The guidewire was then removed.  Through the Affixus guide arm, the drill sleeve for the lag screw was inserted. A small stab incision was made along the lateral femur to allow for the drill guide to be placed along  the lateral cortex of the femur. A drill pin was then advanced through the lateral cortex of the femur, across the fracture site and into the femoral head. Its position again was confirmed on AP and lateral C-arm images. This was measured for a depth of 90 mm. The drill pin was then over-drilled with the lag screw drilled to a depth of 90 mm and then a 90-mm lag screw was advanced by hand into position. Its final position was confirmed on AP and lateral C-arm images. A tip-apex distance of less than 25 mm was achieved. The set screw was then tightened at the top of the intramedullary rod and loosened a quarter turn. A compression wheel had been used to compress the fracture just prior to setting the set screw as traction was gently removed from the fracture site. The fracture remained well reduced. Attention was then turned to the placement of the distal interlocking screw.   A drill guide for the distal interlocking screw was then placed alongside the lateral femur through a small stab incision. The drill sleeve was placed through the Affixus guide arm. The drill was then advanced bicortically. Its depth was measured off the drill bit. A 36-mm distal interlocking screw was then advanced into position by hand. The Affixus guide arm was then removed, and final images of the intramedullary construct were taken in both the AP and lateral planes. The hardware was in good position and the fracture remained well reduced. The wounds were then copiously irrigated. The proximal incision had the deep fascia closed with an interrupted 0 Vicryl. The subcutaneous tissue was closed with 2-0 Vicryl and the skin approximated with staples. A dry sterile dressing was applied, and the patient was then transferred to a hospital bed and brought to the PACU in stable condition.  I was scrubbed and present for the entire case, and all sharp and instrument counts were correct at the conclusion of the case.  I spoke with the patient's  boss at her request postoperatively to let her know the case had gone without complication. The patient was stable in the recovery room. The patient requested I not talk to anybody else, even family members, regarding her surgery.    ____________________________ Timoteo Gaul, MD klk:jh D: 01/08/2015 12:22:51 ET T: 01/08/2015 12:49:16 ET JOB#: [id number]  cc: Timoteo Gaul, MD, <Dictator> Timoteo Gaul MD ELECTRONICALLY SIGNED 01/16/2015 9:31

## 2015-02-25 NOTE — Consult Note (Signed)
PATIENT NAME:  Rebecca Faulkner, GILLINGHAM MR#:  [id number] DATE OF BIRTH:  1941-08-26  DATE OF CONSULTATION:  01/02/2015  CONSULTING PHYSICIAN:  Timoteo Gaul, MD  REASON FOR CONSULTATION: Right intertrochanteric hip fracture.   HISTORY OF PRESENT ILLNESS: Rebecca Faulkner is a 74 year old female who sustained a fall earlier today. She had pain in the right hip after the fall and was unable to stand or ambulate. The patient denies syncope or loss of consciousness or other injuries. She states that she hit the right side of her jaw during the fall, but did not lose consciousness.   PAST MEDICAL HISTORY: Includes left-sided breast cancer and she is status post left mastectomy.   SOCIAL HISTORY: The patient denies smoking, drinking, or drug use.   PAST SURGICAL HISTORY: Includes a left mastectomy, hernia repair, surgery for a fracture of the left proximal humerus and a C-section.   ALLERGIES: CODEINE.   HOME MEDICATIONS: Include Xalatan 0.005% ophthalmic solution 1 drop to each affected eye once daily in the evening, Femara 2.5 mg once daily at bedtime, calcium 600 +D 1 tablet daily at bedtime, and alendronate 70 mg p.o. weekly.   PHYSICAL EXAMINATION:  GENERAL: The patient is lying supine in her hospital bed.  EXTREMITIES: Her right leg is in the Buck's traction. She has shortening and external rotation of the right lower extremity. The skin overlying her right hip is intact and there is no erythema, ecchymosis, or swelling. Her thigh and lower leg compartments are soft and compressible. She has no calf tenderness or lower extremity edema. She has palpable pedal pulses, intact sensation throughout the right lower extremity, and she can flex and extend her toes and dorsiflex and plantarflex her right ankle.   RADIOLOGY: X-ray films of the right hip were reviewed by me today. This shows a comminuted right intertrochanteric hip fracture. The fracture has assumed a varus position and has shortened. The greater  trochanter is also fractured and the lesser trochanter has fractured and displaced.   ASSESSMENT: Right intertrochanteric hip fracture.   PLAN: I recommended to [name] that we proceed with surgical fixation of her right hip given her high level of functioning preoperatively. I recommended intramedullary fixation for her fracture given the comminution. I explained the details of the surgery, including the postoperative course. I reviewed the risks and benefits of surgery. The risks include, but are not limited to, infection, bleeding, nerve or blood vessel injury, leg length discrepancy, change in lower extremity rotation, hardware failure, malunion, nonunion, persistent hip pain, and the need for further surgery. Medical risks include, but are not limited to,. DVT and pulmonary embolism, myocardial infarction, stroke, pneumonia, respiratory failure, and death. The patient understood these risks and wished to proceed. I marked the right hip with the word "yes" and my initials, according to the hospital's correct site of surgery protocol. The nurse will have the patient sign the consent forms for surgery, and blood transfusions if necessary. I have reviewed all radiographic studies and laboratories in preparation for the surgery. She will be n.p.o. after midnight. She has been cleared by the hospitalist service for surgery.    ____________________________ Timoteo Gaul, MD klk:LT D: 01/02/2015 18:53:00 ET T: 01/02/2015 19:38:26 ET JOB#: [id number]  cc: Timoteo Gaul, MD, <Dictator> Timoteo Gaul MD ELECTRONICALLY SIGNED 01/08/2015 12:26

## 2015-04-12 ENCOUNTER — Ambulatory Visit: Payer: Self-pay | Admitting: Family Medicine

## 2015-04-12 ENCOUNTER — Other Ambulatory Visit: Payer: Self-pay

## 2015-04-12 DIAGNOSIS — Z1231 Encounter for screening mammogram for malignant neoplasm of breast: Secondary | ICD-10-CM

## 2015-04-19 ENCOUNTER — Other Ambulatory Visit: Payer: Self-pay

## 2015-04-19 ENCOUNTER — Ambulatory Visit: Payer: Self-pay | Admitting: Oncology

## 2015-05-03 ENCOUNTER — Ambulatory Visit: Payer: Self-pay

## 2015-05-08 ENCOUNTER — Ambulatory Visit
Admission: RE | Admit: 2015-05-08 | Discharge: 2015-05-08 | Disposition: A | Discharge status: Home / Self Care | Payer: Medicare Other | Source: Ambulatory Visit | Attending: General Surgery | Admitting: General Surgery

## 2015-05-08 DIAGNOSIS — Z1231 Encounter for screening mammogram for malignant neoplasm of breast: Secondary | ICD-10-CM

## 2015-05-08 HISTORY — DX: Malignant neoplasm of unspecified site of unspecified female breast: C50.919

## 2015-05-10 ENCOUNTER — Encounter: Payer: Self-pay | Admitting: Family Medicine

## 2015-05-10 ENCOUNTER — Ambulatory Visit: Payer: Self-pay | Admitting: General Surgery

## 2015-05-10 DIAGNOSIS — Z8781 Personal history of (healed) traumatic fracture: Secondary | ICD-10-CM | POA: Insufficient documentation

## 2015-05-14 ENCOUNTER — Ambulatory Visit (INDEPENDENT_AMBULATORY_CARE_PROVIDER_SITE_OTHER): Payer: Medicare Other | Admitting: General Surgery

## 2015-05-14 VITALS — BP 122/66 | HR 70 | Resp 12 | Ht 61.0 in | Wt 124.0 lb

## 2015-05-14 DIAGNOSIS — Z853 Personal history of malignant neoplasm of breast: Secondary | ICD-10-CM | POA: Diagnosis not present

## 2015-05-14 NOTE — Progress Notes (Signed)
Patient ID: Rebecca Faulkner, female   DOB: Nov 11, 1940, 74 y.o.   MRN: [id number]  Chief Complaint  Patient presents with  . Follow-up    Mammogram    HPI Rebecca Faulkner is a 74 y.o. female who presents for a breast cancer following upThe most recent left  mammogram was done on 05/08/15.  Patient does perform regular self breast checks and gets regular mammograms done.    HPI  Past Medical History  Diagnosis Date  . Ulcer 1974  . Heart murmur 1990  . Lump or mass in breast   . Malignant neoplasm of upper-outer quadrant of female breast 2013    R-breast T1, NO, MO, ER/PR pos., Her 2 neg.  . Breast cancer 2013    right breast    Past Surgical History  Procedure Laterality Date  . Right inguinal hernia repair  2013  . Fracture surgery Left 2011    arm, plate and 12 screws  . Cesarean section  1969  . Breast surgery Right 2013  . Vein surgery Left 2015  . Mastectomy Right 2013    No family history on file.  Social History History  Substance Use Topics  . Smoking status: Never Smoker   . Smokeless tobacco: Never Used  . Alcohol Use: 25.2 oz/week    42 Cans of beer per week    Allergies  Allergen Reactions  . Codeine Nausea Only    Current Outpatient Prescriptions  Medication Sig Dispense Refill  . alendronate (FOSAMAX) 70 MG tablet Take 70 mg by mouth once a week.   0  . calcium carbonate (OS-CAL) 600 MG TABS Take 600 mg by mouth 2 (two) times daily with a meal.    . latanoprost (XALATAN) 0.005 % ophthalmic solution Place 1 drop into both eyes at bedtime.   2  . letrozole (FEMARA) 2.5 MG tablet Take 1 tablet by mouth daily.    Marland Kitchen lidocaine (LIDODERM) 5 % Place 1 patch onto the skin daily. Remove & Discard patch within 12 hours or as directed by MD    . timolol (BETIMOL) 0.5 % ophthalmic solution Place 1 drop into both eyes daily.      No current facility-administered medications for this visit.    Review of Systems Review of Systems  Blood pressure 122/66,  pulse 70, resp. rate 12, height 5\' 1"  (1.549 m), weight 124 lb (56.246 kg).  Physical Exam Physical Exam  Constitutional: She is oriented to person, place, and time. She appears well-developed and well-nourished.  Eyes: Conjunctivae are normal. No scleral icterus.  Neck: Neck supple.  Cardiovascular: Normal rate, regular rhythm and normal heart sounds.   Pulmonary/Chest: Effort normal and breath sounds normal. Left breast exhibits no inverted nipple, no mass, no nipple discharge, no skin change and no tenderness.  Right mastectomy site well healed.   Abdominal: Soft. Bowel sounds are normal. There is no hepatomegaly. There is no tenderness.  Lymphadenopathy:    She has no cervical adenopathy.    She has no axillary adenopathy.  Previous  right subpectoral node is no longer palpated.   Neurological: She is alert and oriented to person, place, and time.  Skin: Skin is warm and dry.    Data Reviewed R Mammogram reviewed and stable   Assessment    3 years post right mastectomy/SN biopsy, Stable exam. Patient is on Letrozole and doing well.     Plan    The patient has been asked to return to the office in  one year with a right diagnostic mammogram.     PCP:  Terance Hart 05/14/2015, 10:34 AM

## 2015-05-14 NOTE — Patient Instructions (Addendum)
The patient has been asked to return to the office in one year with a right diagnostic mammogram.  Continue self breast exams. Call office for any new breast issues or concerns.

## 2015-05-18 ENCOUNTER — Other Ambulatory Visit: Payer: Self-pay | Admitting: *Deleted

## 2015-05-18 DIAGNOSIS — Z853 Personal history of malignant neoplasm of breast: Secondary | ICD-10-CM

## 2015-05-24 ENCOUNTER — Inpatient Hospital Stay (HOSPITAL_BASED_OUTPATIENT_CLINIC_OR_DEPARTMENT_OTHER): Payer: Medicare Other | Admitting: Oncology

## 2015-05-24 ENCOUNTER — Inpatient Hospital Stay: Payer: Medicare Other | Attending: Oncology

## 2015-05-24 VITALS — BP 133/83 | HR 73 | Temp 97.4°F | Wt 130.3 lb

## 2015-05-24 DIAGNOSIS — Z79811 Long term (current) use of aromatase inhibitors: Secondary | ICD-10-CM | POA: Insufficient documentation

## 2015-05-24 DIAGNOSIS — Z853 Personal history of malignant neoplasm of breast: Secondary | ICD-10-CM

## 2015-05-24 DIAGNOSIS — Z9011 Acquired absence of right breast and nipple: Secondary | ICD-10-CM | POA: Insufficient documentation

## 2015-05-24 DIAGNOSIS — Z79899 Other long term (current) drug therapy: Secondary | ICD-10-CM | POA: Diagnosis not present

## 2015-05-24 DIAGNOSIS — Z170 Estrogen receptor positive status [ER+]: Secondary | ICD-10-CM

## 2015-05-24 DIAGNOSIS — C50911 Malignant neoplasm of unspecified site of right female breast: Secondary | ICD-10-CM | POA: Insufficient documentation

## 2015-05-24 LAB — CBC WITH DIFFERENTIAL/PLATELET
BASOS ABS: 0.1 10*3/uL (ref 0–0.1)
Basophils Relative: 1 %
Eosinophils Absolute: 0 10*3/uL (ref 0–0.7)
Eosinophils Relative: 1 %
HEMATOCRIT: 38.4 % (ref 35.0–47.0)
HEMOGLOBIN: 12.7 g/dL (ref 12.0–16.0)
LYMPHS ABS: 1.2 10*3/uL (ref 1.0–3.6)
LYMPHS PCT: 22 %
MCH: 30.3 pg (ref 26.0–34.0)
MCHC: 33.1 g/dL (ref 32.0–36.0)
MCV: 91.5 fL (ref 80.0–100.0)
Monocytes Absolute: 0.6 10*3/uL (ref 0.2–0.9)
Monocytes Relative: 10 %
NEUTROS ABS: 3.8 10*3/uL (ref 1.4–6.5)
Neutrophils Relative %: 66 %
PLATELETS: 247 10*3/uL (ref 150–440)
RBC: 4.19 MIL/uL (ref 3.80–5.20)
RDW: 13.8 % (ref 11.5–14.5)
WBC: 5.7 10*3/uL (ref 3.6–11.0)

## 2015-05-24 LAB — COMPREHENSIVE METABOLIC PANEL
ALT: 11 U/L — ABNORMAL LOW (ref 14–54)
AST: 18 U/L (ref 15–41)
Albumin: 4.4 g/dL (ref 3.5–5.0)
Alkaline Phosphatase: 51 U/L (ref 38–126)
Anion gap: 5 (ref 5–15)
BILIRUBIN TOTAL: 0.6 mg/dL (ref 0.3–1.2)
BUN: 12 mg/dL (ref 6–20)
CHLORIDE: 105 mmol/L (ref 101–111)
CO2: 25 mmol/L (ref 22–32)
Calcium: 8.7 mg/dL — ABNORMAL LOW (ref 8.9–10.3)
Creatinine, Ser: 0.68 mg/dL (ref 0.44–1.00)
GFR calc Af Amer: >60 mL/min (ref >60)
GFR calc non Af Amer: >60 mL/min (ref >60)
Glucose, Bld: 105 mg/dL — ABNORMAL HIGH (ref 65–99)
Potassium: 4.8 mmol/L (ref 3.5–5.1)
SODIUM: 135 mmol/L (ref 135–145)
Total Protein: 7.2 g/dL (ref 6.5–8.1)

## 2015-05-24 NOTE — Progress Notes (Signed)
Patient does not have living will.  Never smoked. 

## 2015-05-27 ENCOUNTER — Encounter: Payer: Self-pay | Admitting: Oncology

## 2015-05-27 NOTE — Progress Notes (Signed)
[place] @ [facility] [phone]  Fax:([phone]) [place]: 1941/01/24  MR#: [id number]  ZSW#:[id number]  Patient Care Team: Steele Sizer, MD as PCP - General (Family Medicine) Seeplaputhur Robinette Haines, MD (General Surgery) Steele Sizer, MD as Attending Physician (Family Medicine)  CHIEF COMPLAINT:  Chief Complaint  Patient presents with  . Follow-up      1.  Carcinoma of breast status post right modified radical mastectomy with sentinel lymph node evaluation. T1cN0M0, Stage IC. ER positive.  PR positive.  HER-2 receptor negative by FISH. Diagnoses in August of 2013. 2.  Oncotype DX score is 10 (7% chance of recurrent disease or if I years with anti-hormonal treatment ) 3.  Femara started in October of 2013  INTERVAL HISTORY:  74 year old lady with history of carcinoma of breast came today further follow-up.  Taking Cl[place]ara calcium and vitamin D recently was admitted in the hospital with hip fracture.  No other bony pain.  Appetite has been stable.  No nausea.  No vomiting.  No diarrhea.  REVIEW OF SYSTEMS:   GENERAL:  Feels good.  Active.  No fevers, sweats or weight loss. PERFORMANCE STATUS (ECOG): 01 HEENT:  No visual changes, runny nose, sore throat, mouth sores or tenderness. Lungs: No shortness of breath or cough.  No hemoptysis. Cardiac:  No chest pain, palpitations, orthopnea, or PND. GI:  No nausea, vomiting, diarrhea, constipation, melena or hematochezia. GU:  No urgency, frequency, dysuria, or hematuria. Musculoskeletal:  No back pain.  No joint pain.  No muscle tenderness. Extremities:  No pain or swelling. Skin:  No rashes or skin changes. Neuro:  No headache, numbness or weakness, balance or coordination issues. Endocrine:  No diabetes, thyroid issues, hot flashes or night sweats. Psych:  No mood changes, depression or anxiety. Pain:  No focal pain. Review of systems:  All other systems reviewed and found to be negative. As per  HPI. Otherwise, a complete review of systems is negatve.  PAST MEDICAL HISTORY: Past Medical History  Diagnosis Date  . Ulcer 1974  . Heart murmur 1990  . Lump or mass in breast   . Malignant neoplasm of upper-outer quadrant of female breast 2013    R-breast T1, NO, MO, ER/PR pos., Her 2 neg.  . Breast cancer 2013    right breast    PAST SURGICAL HISTORY: Past Surgical History  Procedure Laterality Date  . Right inguinal hernia repair  2013  . Fracture surgery Left 2011    arm, plate and 12 screws  . Cesarean section  1969  . Breast surgery Right 2013  . Vein surgery Left 2015  . Mastectomy Right 2013    FAMILY HISTORY Family History: noncontributory  Social History: noncontributory, negative alcohol, negative tobacco  Comments: does not smoke.  Does not drink  Additional Past Medical and Surgical History: no significant past medical history   ADVANCED DIRECTIVES:  Patient does not have any living will or healthcare power of attorney.  Information was given .  Available resources had been discussed.  We will follow-up on subsequent appointments regarding this issue HEALTH MAINTENANCE: History  Substance Use Topics  . Smoking status: Never Smoker   . Smokeless tobacco: Never Used  . Alcohol Use: 25.2 oz/week    42 Cans of beer per week      Allergies  Allergen Reactions  . Codeine Nausea Only    Current Outpatient Prescriptions  Medication Sig Dispense Refill  . alendronate (FOSAMAX) 70 MG  tablet Take 70 mg by mouth once a week.   0  . calcium carbonate (OS-CAL) 600 MG TABS Take 600 mg by mouth 2 (two) times daily with a meal.    . latanoprost (XALATAN) 0.005 % ophthalmic solution Place 1 drop into both eyes at bedtime.   2  . letrozole (FEMARA) 2.5 MG tablet Take 1 tablet by mouth daily.    Marland Kitchen lidocaine (LIDODERM) 5 % Place 1 patch onto the skin daily. Remove & Discard patch within 12 hours or as directed by MD    . timolol (BETIMOL) 0.5 % ophthalmic solution  Place 1 drop into both eyes daily.      No current facility-administered medications for this visit.    OBJECTIVE:  Filed Vitals:   05/24/15 1532  BP: 133/83  Pulse: 73  Temp: 97.4 F (36.3 C)     Body mass index is 24.63 kg/(m^2).    ECOG FS:1 - Symptomatic but completely ambulatory  PHYSICAL EXAM: Goal status: Performance status is good.  Patient has not lost significant weight HEENT: No evidence of stomatitis. Sclera and conjunctivae :: No jaundice.   pale looking. Lungs: Air  entry equal on both sides.  No rhonchi.  No rales.  Cardiac: Heart sounds are normal.  No pericardial rub.  No murmur. Lymphatic system: Cervical, axillary, inguinal, lymph nodes not palpable GI: Abdomen is soft.  No ascites.  Liver spleen not palpable.  No tenderness.  Bowel sounds are within normal limit Lower extremity: No edema Neurological system: Higher functions, cranial nerves intact no evidence of peripheral neuropathy. Skin: No rash.  No ecchymosis.. Right chest wall area there is no evidence of recurrent disease left breast free of masses  LAB RESULTS:  Appointment on 05/24/2015  Component Date Value Ref Range Status  . WBC 05/24/2015 5.7  3.6 - 11.0 K/uL Final  . RBC 05/24/2015 4.19  3.80 - 5.20 MIL/uL Final  . Hemoglobin 05/24/2015 12.7  12.0 - 16.0 g/dL Final  . HCT 05/24/2015 38.4  35.0 - 47.0 % Final  . MCV 05/24/2015 91.5  80.0 - 100.0 fL Final  . MCH 05/24/2015 30.3  26.0 - 34.0 pg Final  . MCHC 05/24/2015 33.1  32.0 - 36.0 g/dL Final  . RDW 05/24/2015 13.8  11.5 - 14.5 % Final  . Platelets 05/24/2015 247  150 - 440 K/uL Final  . Neutrophils Relative % 05/24/2015 66   Final  . Neutro Abs 05/24/2015 3.8  1.4 - 6.5 K/uL Final  . Lymphocytes Relative 05/24/2015 22   Final  . Lymphs Abs 05/24/2015 1.2  1.0 - 3.6 K/uL Final  . Monocytes Relative 05/24/2015 10   Final  . Monocytes Absolute 05/24/2015 0.6  0.2 - 0.9 K/uL Final  . Eosinophils Relative 05/24/2015 1   Final  .  Eosinophils Absolute 05/24/2015 0.0  0 - 0.7 K/uL Final  . Basophils Relative 05/24/2015 1   Final  . Basophils Absolute 05/24/2015 0.1  0 - 0.1 K/uL Final  . Sodium 05/24/2015 135  135 - 145 mmol/L Final  . Potassium 05/24/2015 4.8  3.5 - 5.1 mmol/L Final  . Chloride 05/24/2015 105  101 - 111 mmol/L Final  . CO2 05/24/2015 25  22 - 32 mmol/L Final  . Glucose, Bld 05/24/2015 105* 65 - 99 mg/dL Final  . BUN 05/24/2015 12  6 - 20 mg/dL Final  . Creatinine, Ser 05/24/2015 0.68  0.44 - 1.00 mg/dL Final  . Calcium 05/24/2015 8.7* 8.9 - 10.3 mg/dL  Final  . Total Protein 05/24/2015 7.2  6.5 - 8.1 g/dL Final  . Albumin 05/24/2015 4.4  3.5 - 5.0 g/dL Final  . AST 05/24/2015 18  15 - 41 U/L Final  . ALT 05/24/2015 11* 14 - 54 U/L Final  . Alkaline Phosphatase 05/24/2015 51  38 - 126 U/L Final  . Total Bilirubin 05/24/2015 0.6  0.3 - 1.2 mg/dL Final  . GFR calc non Af Amer 05/24/2015 >60  >60 mL/min Final  . GFR calc Af Amer 05/24/2015 >60  >60 mL/min Final   Comment: (NOTE) The eGFR has been calculated using the CKD EPI equation. This calculation has not been validated in all clinical situations. eGFR's persistently <60 mL/min signify possible Chronic Kidney Disease.   . Anion gap 05/24/2015 5  5 - 15 Final      STUDIES: Mm Digital Screening Unilat L  05/08/2015   CLINICAL DATA:  Screening.  EXAM: DIGITAL SCREENING UNILATERAL LEFT MAMMOGRAM WITH CAD  COMPARISON:  Previous exam(s).  ACR Breast Density Category c: The breast tissue is heterogeneously dense, which may obscure small masses.  FINDINGS: There are no findings suspicious for malignancy. Images were processed with CAD.  IMPRESSION: No mammographic evidence of malignancy. A result letter of this screening mammogram will be mailed directly to the patient.  RECOMMENDATION: Screening mammogram in one year. (Code:SM-B-01Y)  BI-RADS CATEGORY  1: Negative.   Electronically Signed   By: Everlean Alstrom M.D.   On: 05/08/2015 09:19     ASSESSMENT: Carcinoma of right breast status post modified radical mastectomy there is no evidence of recurrent disease mammogram on July 22 has been reported to be negative. Continue Femara.  MEDICAL DECISION MAKING:  Lab data and mammogram results have been reviewed  Patient expressed understanding and was in agreement with this plan. She also understands that She can call clinic at any time with any questions, concerns, or complaints.    No matching staging information was found for the patient.  Forest Gleason, MD   05/27/2015 4:01 PM

## 2015-08-24 ENCOUNTER — Telehealth: Payer: Self-pay | Admitting: *Deleted

## 2015-08-24 MED ORDER — ALENDRONATE SODIUM 70 MG PO TABS: 70.0000 mg | ORAL_TABLET | ORAL | Status: DC

## 2015-08-24 NOTE — Telephone Encounter (Signed)
Escribed

## 2015-11-20 ENCOUNTER — Telehealth: Payer: Self-pay | Admitting: *Deleted

## 2015-11-20 MED ORDER — ALENDRONATE SODIUM 70 MG PO TABS: 70.0000 mg | ORAL_TABLET | ORAL | Status: DC

## 2015-11-20 NOTE — Telephone Encounter (Signed)
escribed

## 2015-11-22 ENCOUNTER — Inpatient Hospital Stay: Payer: Medicare Other

## 2015-11-22 ENCOUNTER — Inpatient Hospital Stay: Payer: Medicare Other | Admitting: Oncology

## 2015-12-04 ENCOUNTER — Inpatient Hospital Stay: Payer: Medicare Other | Admitting: Oncology

## 2015-12-04 ENCOUNTER — Inpatient Hospital Stay: Payer: Medicare Other

## 2015-12-25 ENCOUNTER — Inpatient Hospital Stay: Payer: Medicare Other

## 2015-12-25 ENCOUNTER — Inpatient Hospital Stay: Payer: Medicare Other | Attending: Oncology | Admitting: Oncology

## 2015-12-25 ENCOUNTER — Encounter: Payer: Self-pay | Admitting: Oncology

## 2015-12-25 VITALS — BP 150/87 | HR 74 | Temp 97.7°F | Resp 18 | Wt 130.7 lb

## 2015-12-25 DIAGNOSIS — M8580 Other specified disorders of bone density and structure, unspecified site: Secondary | ICD-10-CM | POA: Diagnosis not present

## 2015-12-25 DIAGNOSIS — Z853 Personal history of malignant neoplasm of breast: Secondary | ICD-10-CM

## 2015-12-25 DIAGNOSIS — Z170 Estrogen receptor positive status [ER+]: Secondary | ICD-10-CM | POA: Diagnosis not present

## 2015-12-25 DIAGNOSIS — Z79811 Long term (current) use of aromatase inhibitors: Secondary | ICD-10-CM | POA: Diagnosis not present

## 2015-12-25 DIAGNOSIS — Z9011 Acquired absence of right breast and nipple: Secondary | ICD-10-CM

## 2015-12-25 DIAGNOSIS — Z79899 Other long term (current) drug therapy: Secondary | ICD-10-CM | POA: Diagnosis not present

## 2015-12-25 DIAGNOSIS — Z8781 Personal history of (healed) traumatic fracture: Secondary | ICD-10-CM | POA: Diagnosis not present

## 2015-12-25 DIAGNOSIS — C50411 Malignant neoplasm of upper-outer quadrant of right female breast: Secondary | ICD-10-CM | POA: Diagnosis not present

## 2015-12-25 LAB — COMPREHENSIVE METABOLIC PANEL
ALK PHOS: 45 U/L (ref 38–126)
ALT: 14 U/L (ref 14–54)
AST: 19 U/L (ref 15–41)
Albumin: 4.6 g/dL (ref 3.5–5.0)
Anion gap: 6 (ref 5–15)
BILIRUBIN TOTAL: 0.7 mg/dL (ref 0.3–1.2)
BUN: 11 mg/dL (ref 6–20)
CALCIUM: 9.2 mg/dL (ref 8.9–10.3)
CHLORIDE: 104 mmol/L (ref 101–111)
CO2: 23 mmol/L (ref 22–32)
CREATININE: 0.61 mg/dL (ref 0.44–1.00)
GFR calc Af Amer: >60 mL/min (ref >60)
Glucose, Bld: 100 mg/dL — ABNORMAL HIGH (ref 65–99)
Potassium: 4 mmol/L (ref 3.5–5.1)
Sodium: 133 mmol/L — ABNORMAL LOW (ref 135–145)
Total Protein: 7.8 g/dL (ref 6.5–8.1)

## 2015-12-25 LAB — CBC WITH DIFFERENTIAL/PLATELET
BASOS ABS: 0.1 10*3/uL (ref 0–0.1)
Basophils Relative: 1 %
Eosinophils Absolute: 0 10*3/uL (ref 0–0.7)
Eosinophils Relative: 1 %
HEMATOCRIT: 41.6 % (ref 35.0–47.0)
HEMOGLOBIN: 14.3 g/dL (ref 12.0–16.0)
LYMPHS ABS: 1.6 10*3/uL (ref 1.0–3.6)
LYMPHS PCT: 24 %
MCH: 31.6 pg (ref 26.0–34.0)
MCHC: 34.4 g/dL (ref 32.0–36.0)
MCV: 91.8 fL (ref 80.0–100.0)
Monocytes Absolute: 0.7 10*3/uL (ref 0.2–0.9)
Monocytes Relative: 10 %
NEUTROS ABS: 4.1 10*3/uL (ref 1.4–6.5)
NEUTROS PCT: 64 %
PLATELETS: 248 10*3/uL (ref 150–440)
RBC: 4.52 MIL/uL (ref 3.80–5.20)
RDW: 12.9 % (ref 11.5–14.5)
WBC: 6.4 10*3/uL (ref 3.6–11.0)

## 2015-12-25 NOTE — Progress Notes (Signed)
[place] [facility] Telephone:(336) [phone]  Fax:(336) [place]: 1941/10/02  MR#: [id number]  UTM#:[id number]  Patient Care Team: Steele Sizer, MD as PCP - General (Family Medicine) Seeplaputhur Robinette Haines, MD (General Surgery) Steele Sizer, MD as Attending Physician (Family Medicine)  CHIEF COMPLAINT:  Chief Complaint  Patient presents with  . Breast Cancer      1.  Carcinoma of breast status post right modified radical mastectomy with sentinel lymph node evaluation. T1cN0M0, Stage IC. ER positive.  PR positive.  HER-2 receptor negative by FISH. Diagnoses in August of 2013. 2.  Oncotype DX score is 10 (7% chance of recurrent disease or if I years with anti-hormonal treatment ) 3.  Femara started in October of 2013  INTERVAL HISTORY:  75 year old lady with history of carcinoma of breast came today further follow-up.  Taking Cl[place]ara calcium and vitamin D recently was admitted in the hospital with hip fracture.  No other bony pain.  Appetite has been stable.  No nausea.  No vomiting.  No diarrhea. Patient is here for ongoing evaluation and treatment consideration.  Tolerating letrozole very well without any significant bony pain or side effect. And osteopenia getting regular bone density which will be repeated in July.  Patient is taking Fosamax once a week. Patient had a mammogram in July of 2016 reported to be negative  REVIEW OF SYSTEMS:   GENERAL:  Feels good.  Active.  No fevers, sweats or weight loss. PERFORMANCE STATUS (ECOG): 01 HEENT:  No visual changes, runny nose, sore throat, mouth sores or tenderness. Lungs: No shortness of breath or cough.  No hemoptysis. Cardiac:  No chest pain, palpitations, orthopnea, or PND. GI:  No nausea, vomiting, diarrhea, constipation, melena or hematochezia. GU:  No urgency, frequency, dysuria, or hematuria. Musculoskeletal:  No back pain.  No joint pain.  No muscle tenderness. Extremities:  No pain or  swelling. Skin:  No rashes or skin changes. Neuro:  No headache, numbness or weakness, balance or coordination issues. Endocrine:  No diabetes, thyroid issues, hot flashes or night sweats. Psych:  No mood changes, depression or anxiety. Pain:  No focal pain. Review of systems:  All other systems reviewed and found to be negative. As per HPI. Otherwise, a complete review of systems is negatve.  PAST MEDICAL HISTORY: Past Medical History  Diagnosis Date  . Ulcer 1974  . Heart murmur 1990  . Lump or mass in breast   . Malignant neoplasm of upper-outer quadrant of female breast ([place]) 2013    R-breast T1, NO, MO, ER/PR pos., Her 2 neg.  . Breast cancer [facility]) 2013    right breast    PAST SURGICAL HISTORY: Past Surgical History  Procedure Laterality Date  . Right inguinal hernia repair  2013  . Fracture surgery Left 2011    arm, plate and 12 screws  . Cesarean section  1969  . Breast surgery Right 2013  . Vein surgery Left 2015  . Mastectomy Right 2013    FAMILY HISTORY Family History: noncontributory  Social History: noncontributory, negative alcohol, negative tobacco  Comments: does not smoke.  Does not drink  Additional Past Medical and Surgical History: no significant past medical history   ADVANCED DIRECTIVES:  Patient does not have any living will or healthcare power of attorney.  Information was given .  Available resources had been discussed.  We will follow-up on subsequent appointments regarding this issue HEALTH MAINTENANCE: Social History  Substance Use Topics  .  Smoking status: Never Smoker   . Smokeless tobacco: Never Used  . Alcohol Use: 25.2 oz/week    42 Cans of beer per week      Allergies  Allergen Reactions  . Codeine Nausea Only    Current Outpatient Prescriptions  Medication Sig Dispense Refill  . alendronate (FOSAMAX) 70 MG tablet Take 1 tablet (70 mg total) by mouth once a week. 12 tablet 2  . calcium carbonate (OS-CAL) 600 MG TABS Take 600  mg by mouth 2 (two) times daily with a meal.    . latanoprost (XALATAN) 0.005 % ophthalmic solution Place 1 drop into both eyes at bedtime.   2  . letrozole (FEMARA) 2.5 MG tablet Take 1 tablet by mouth daily.    Marland Kitchen lidocaine (LIDODERM) 5 % Place 1 patch onto the skin daily. Remove & Discard patch within 12 hours or as directed by MD    . timolol (BETIMOL) 0.5 % ophthalmic solution Place 1 drop into both eyes daily.      No current facility-administered medications for this visit.    OBJECTIVE:  Filed Vitals:   12/25/15 1426  BP: 150/87  Pulse: 74  Temp: 97.7 F (36.5 C)  Resp: 18     Body mass index is 24.71 kg/(m^2).    ECOG FS:1 - Symptomatic but completely ambulatory  PHYSICAL EXAM: Goal status: Performance status is good.  Patient has not lost significant weight HEENT: No evidence of stomatitis. Sclera and conjunctivae :: No jaundice.   pale looking. Lungs: Air  entry equal on both sides.  No rhonchi.  No rales.  Cardiac: Heart sounds are normal.  No pericardial rub.  No murmur. Lymphatic system: Cervical, axillary, inguinal, lymph nodes not palpable GI: Abdomen is soft.  No ascites.  Liver spleen not palpable.  No tenderness.  Bowel sounds are within normal limit Lower extremity: No edema Neurological system: Higher functions, cranial nerves intact no evidence of peripheral neuropathy. Skin: No rash.  No ecchymosis.. Right chest wall area there is no evidence of recurrent disease left breast free of masses  LAB RESULTS:  Appointment on 12/25/2015  Component Date Value Ref Range Status  . WBC 12/25/2015 6.4  3.6 - 11.0 K/uL Final  . RBC 12/25/2015 4.52  3.80 - 5.20 MIL/uL Final  . Hemoglobin 12/25/2015 14.3  12.0 - 16.0 g/dL Final  . HCT 12/25/2015 41.6  35.0 - 47.0 % Final  . MCV 12/25/2015 91.8  80.0 - 100.0 fL Final  . MCH 12/25/2015 31.6  26.0 - 34.0 pg Final  . MCHC 12/25/2015 34.4  32.0 - 36.0 g/dL Final  . RDW 12/25/2015 12.9  11.5 - 14.5 % Final  . Platelets  12/25/2015 248  150 - 440 K/uL Final  . Neutrophils Relative % 12/25/2015 64   Final  . Neutro Abs 12/25/2015 4.1  1.4 - 6.5 K/uL Final  . Lymphocytes Relative 12/25/2015 24   Final  . Lymphs Abs 12/25/2015 1.6  1.0 - 3.6 K/uL Final  . Monocytes Relative 12/25/2015 10   Final  . Monocytes Absolute 12/25/2015 0.7  0.2 - 0.9 K/uL Final  . Eosinophils Relative 12/25/2015 1   Final  . Eosinophils Absolute 12/25/2015 0.0  0 - 0.7 K/uL Final  . Basophils Relative 12/25/2015 1   Final  . Basophils Absolute 12/25/2015 0.1  0 - 0.1 K/uL Final  . Sodium 12/25/2015 133* 135 - 145 mmol/L Final  . Potassium 12/25/2015 4.0  3.5 - 5.1 mmol/L Final  . Chloride  12/25/2015 104  101 - 111 mmol/L Final  . CO2 12/25/2015 23  22 - 32 mmol/L Final  . Glucose, Bld 12/25/2015 100* 65 - 99 mg/dL Final  . BUN 12/25/2015 11  6 - 20 mg/dL Final  . Creatinine, Ser 12/25/2015 0.61  0.44 - 1.00 mg/dL Final  . Calcium 12/25/2015 9.2  8.9 - 10.3 mg/dL Final  . Total Protein 12/25/2015 7.8  6.5 - 8.1 g/dL Final  . Albumin 12/25/2015 4.6  3.5 - 5.0 g/dL Final  . AST 12/25/2015 19  15 - 41 U/L Final  . ALT 12/25/2015 14  14 - 54 U/L Final  . Alkaline Phosphatase 12/25/2015 45  38 - 126 U/L Final  . Total Bilirubin 12/25/2015 0.7  0.3 - 1.2 mg/dL Final  . GFR calc non Af Amer 12/25/2015 >60  >60 mL/min Final  . GFR calc Af Amer 12/25/2015 >60  >60 mL/min Final   Comment: (NOTE) The eGFR has been calculated using the CKD EPI equation. This calculation has not been validated in all clinical situations. eGFR's persistently <60 mL/min signify possible Chronic Kidney Disease.   . Anion gap 12/25/2015 6  5 - 15 Final      STUDIES: No results found.  ASSESSMENT: Carcinoma of right breast status post modified radical mastectomy there is no evidence of recurrent disease mammogram on July 22 has been reported to be negative. Continue Femara. No clinical evidence of recurrent disease. Repeat mammogram in  July.2017 Discussed possibility of extended adjuvant therapy after checking breast cancer index but patient is supposed to take Femara until 2018 I discussed that possibility of mean not being here in next 6 month because of my planned retirement and patient will be seen by other doctors offered choice.  She will receive a letter regarding that.  On density also would be repeated in July of 2017 MEDICAL DECISION MAKING:  Lab data and mammogram results have been reviewed  Patient expressed understanding and was in agreement with this plan. She also understands that She can call clinic at any time with any questions, concerns, or complaints.    No matching staging information was found for the patient.  Forest Gleason, MD   12/25/2015 2:37 PM

## 2016-03-24 ENCOUNTER — Emergency Department
Admission: EM | Admit: 2016-03-24 | Discharge: 2016-03-24 | Disposition: A | Discharge status: Home / Self Care | Payer: Medicare Other | Attending: Emergency Medicine | Admitting: Emergency Medicine

## 2016-03-24 DIAGNOSIS — R05 Cough: Secondary | ICD-10-CM | POA: Diagnosis present

## 2016-03-24 DIAGNOSIS — S80862A Insect bite (nonvenomous), left lower leg, initial encounter: Secondary | ICD-10-CM | POA: Diagnosis not present

## 2016-03-24 DIAGNOSIS — Y999 Unspecified external cause status: Secondary | ICD-10-CM | POA: Diagnosis not present

## 2016-03-24 DIAGNOSIS — W57XXXA Bitten or stung by nonvenomous insect and other nonvenomous arthropods, initial encounter: Secondary | ICD-10-CM | POA: Diagnosis not present

## 2016-03-24 DIAGNOSIS — Y929 Unspecified place or not applicable: Secondary | ICD-10-CM | POA: Diagnosis not present

## 2016-03-24 DIAGNOSIS — Z79899 Other long term (current) drug therapy: Secondary | ICD-10-CM | POA: Diagnosis not present

## 2016-03-24 DIAGNOSIS — Y939 Activity, unspecified: Secondary | ICD-10-CM | POA: Diagnosis not present

## 2016-03-24 DIAGNOSIS — J069 Acute upper respiratory infection, unspecified: Secondary | ICD-10-CM | POA: Diagnosis not present

## 2016-03-24 DIAGNOSIS — Z853 Personal history of malignant neoplasm of breast: Secondary | ICD-10-CM | POA: Insufficient documentation

## 2016-03-24 MED ORDER — PSEUDOEPH-BROMPHEN-DM 30-2-10 MG/5ML PO SYRP: 5.0000 mL | ORAL_SOLUTION | Freq: Four times a day (QID) | ORAL | Status: DC | PRN

## 2016-03-24 MED ORDER — DOXYCYCLINE MONOHYDRATE 100 MG PO CAPS: 100.0000 mg | ORAL_CAPSULE | Freq: Two times a day (BID) | ORAL | Status: DC

## 2016-03-24 NOTE — Discharge Instructions (Signed)

## 2016-03-24 NOTE — ED Notes (Signed)
Pt alert and oriented X4, active, cooperative, pt in NAD. RR even and unlabored, color WNL.  Pt informed to return if any life threatening symptoms occur.   

## 2016-03-24 NOTE — ED Notes (Addendum)
Pt to ED via POV c/o "dry hacking cough" X 3 days and nasal congestion. Pt states she had same sx X 2 weeks ago, got better and now back "from the recent rainstorms". Pt alert and oriented X4, active, cooperative, pt in NAD. RR even and unlabored, color WNL. Pt ambulatory, drove herself here.

## 2016-03-24 NOTE — ED Notes (Signed)
See triage note  Dry cough and sinus pressure for about 2 weeks  Positive fever and chills

## 2016-03-24 NOTE — ED Provider Notes (Signed)
[facility] Emergency Department Provider Note   ____________________________________________  Time seen: Approximately 9:53 AM  I have reviewed the triage vital signs and the nursing notes.   HISTORY  Chief Complaint Nasal Congestion and Cough    HPI Rebecca Faulkner is a 75 y.o. female patient complaining of a dry hacking cough for 3 days. Patient also stated this new congestion intermittently rhinorrhea. Patient states she had same symptoms 2 weeks ago which resolved file in the medical interventions. Patient states she was called and the recent rain stomach associated with it when he went to a restaurant and became chills secondary to the air conditioning. He states since then she's been having the nonproductive cough. Patient also concern about a tick bite to the left lower leg which occurred one week ago today. He states she removed a tick that had a white spot on it. Patient denies any abnormal joint pain or fever. Patient's myalgias is secondary to her age.   Past Medical History  Diagnosis Date  . Ulcer 1974  . Heart murmur 1990  . Lump or mass in breast   . Malignant neoplasm of upper-outer quadrant of female breast ([place]) 2013    R-breast T1, NO, MO, ER/PR pos., Her 2 neg.  . Breast cancer [facility]) 2013    right breast    Patient Active Problem List   Diagnosis Date Noted  . History of hip fracture 05/10/2015  . History of breast cancer 05/18/2013    Past Surgical History  Procedure Laterality Date  . Right inguinal hernia repair  2013  . Fracture surgery Left 2011    arm, plate and 12 screws  . Cesarean section  1969  . Breast surgery Right 2013  . Vein surgery Left 2015  . Mastectomy Right 2013    Current Outpatient Rx  Name  Route  Sig  Dispense  Refill  . alendronate (FOSAMAX) 70 MG tablet   Oral   Take 1 tablet (70 mg total) by mouth once a week.   12 tablet   2   . brompheniramine-pseudoephedrine-DM 30-2-10 MG/5ML syrup   Oral   Take 5 mLs by mouth 4 (four) times daily as needed.   120 mL   0   . calcium carbonate (OS-CAL) 600 MG TABS   Oral   Take 600 mg by mouth 2 (two) times daily with a meal.         . doxycycline (MONODOX) 100 MG capsule   Oral   Take 1 capsule (100 mg total) by mouth 2 (two) times daily.   20 capsule   0   . latanoprost (XALATAN) 0.005 % ophthalmic solution   Both Eyes   Place 1 drop into both eyes at bedtime.       2   . letrozole (FEMARA) 2.5 MG tablet   Oral   Take 1 tablet by mouth daily.         Marland Kitchen lidocaine (LIDODERM) 5 %   Transdermal   Place 1 patch onto the skin daily. Remove & Discard patch within 12 hours or as directed by MD         . timolol (BETIMOL) 0.5 % ophthalmic solution   Both Eyes   Place 1 drop into both eyes daily.            Allergies Codeine  No family history on file.  Social History Social History  Substance Use Topics  . Smoking status: Never Smoker   .  Smokeless tobacco: Never Used  . Alcohol Use: 25.2 oz/week    42 Cans of beer per week    Review of Systems Constitutional: No fever/chills Eyes: No visual changes. ENT: No sore throat.Nasal congestion and runny nose Cardiovascular: Denies chest pain. Respiratory: Denies shortness of breath.Nonproductive cough Gastrointestinal: No abdominal pain.  No nausea, no vomiting.  No diarrhea.  No constipation. Genitourinary: Negative for dysuria. Musculoskeletal: Negative for back pain. Skin: Negative for rash. Tick bite left lower leg Neurological: Negative for headaches, focal weakness or numbness. 10-point ROS otherwise negative.  ____________________________________________   PHYSICAL EXAM:  VITAL SIGNS: ED Triage Vitals  Enc Vitals Group     BP 03/24/16 0914 154/90 mmHg     Pulse Rate 03/24/16 0914 102     Resp 03/24/16 0914 18     Temp 03/24/16 0914 98.1 F (36.7 C)     Temp Source 03/24/16 0914 Oral     SpO2 03/24/16 0914 98 %     Weight 03/24/16 0914  130 lb (58.968 kg)     Height 03/24/16 0914 5\' 2"  (1.575 m)     Head Cir --      Peak Flow --      Pain Score --      Pain Loc --      Pain Edu? --      Excl. in [place]? --     Constitutional: Alert and oriented. Well appearing and in no acute distress. Eyes: Conjunctivae are normal. PERRL. EOMI. Head: Atraumatic. Nose: No maxillary guarding. Edematous nasal turbinates. Clear rhinorrhea.  Mouth/Throat: Mucous membranes are moist.  Oropharynx non-erythematous. Neck: No stridor.  No cervical spine tenderness to palpation. Hematological/Lymphatic/Immunilogical: No cervical lymphadenopathy. Cardiovascular: Normal rate, regular rhythm. Grossly normal heart sounds.  Good peripheral circulation. Respiratory: Normal respiratory effort.  No retractions. Lungs CTAB. Nonproductive cough Gastrointestinal: Soft and nontender. No distention. No abdominal bruits. No CVA tenderness. Musculoskeletal: No lower extremity tenderness nor edema.  No joint effusions. Neurologic:  Normal speech and language. No gross focal neurologic deficits are appreciated. No gait instability. Skin:  Skin is warm, dry and intact. No rash noted. Psychiatric: Mood and affect are normal. Speech and behavior are normal.  ____________________________________________   LABS (all labs ordered are listed, but only abnormal results are displayed)  Labs Reviewed - No data to display ____________________________________________  EKG   ____________________________________________  RADIOLOGY   ____________________________________________   PROCEDURES  Procedure(s) performed: None  Critical Care performed: No  ____________________________________________   INITIAL IMPRESSION / ASSESSMENT AND PLAN / ED COURSE  Pertinent labs & imaging results that were available during my care of the patient were reviewed by me and considered in my medical decision making (see chart for details).  URI. Tick bite left  leg. ____________________________________________   FINAL CLINICAL IMPRESSION(S) / ED DIAGNOSES  Final diagnoses:  URI (upper respiratory infection)  Tick bite of left lower leg, initial encounter      NEW MEDICATIONS STARTED DURING THIS VISIT:  New Prescriptions   BROMPHENIRAMINE-PSEUDOEPHEDRINE-DM 30-2-10 MG/5ML SYRUP    Take 5 mLs by mouth 4 (four) times daily as needed.   DOXYCYCLINE (MONODOX) 100 MG CAPSULE    Take 1 capsule (100 mg total) by mouth 2 (two) times daily.     Note:  This document was prepared using Dragon voice recognition software and may include unintentional dictation errors.     Sable Feil, PA-C 03/24/16 1007  Daymon Larsen, MD 03/24/16 1010

## 2016-04-04 ENCOUNTER — Other Ambulatory Visit: Payer: Self-pay | Admitting: *Deleted

## 2016-04-04 MED ORDER — LETROZOLE 2.5 MG PO TABS: 2.5000 mg | ORAL_TABLET | Freq: Every day | ORAL | Status: DC

## 2016-04-07 DIAGNOSIS — H401131 Primary open-angle glaucoma, bilateral, mild stage: Secondary | ICD-10-CM | POA: Diagnosis not present

## 2016-04-16 DIAGNOSIS — H2513 Age-related nuclear cataract, bilateral: Secondary | ICD-10-CM | POA: Diagnosis not present

## 2016-04-21 NOTE — Discharge Instructions (Signed)

## 2016-04-23 ENCOUNTER — Ambulatory Visit: Payer: Medicare Other | Admitting: Anesthesiology

## 2016-04-23 ENCOUNTER — Ambulatory Visit
Admission: RE | Admit: 2016-04-23 | Discharge: 2016-04-23 | Disposition: A | Discharge status: Home / Self Care | Payer: Medicare Other | Source: Ambulatory Visit | Attending: Ophthalmology | Admitting: Ophthalmology

## 2016-04-23 ENCOUNTER — Encounter
Admission: RE | Disposition: A | Discharge status: Home / Self Care | Payer: Self-pay | Source: Ambulatory Visit | Attending: Ophthalmology

## 2016-04-23 DIAGNOSIS — Z885 Allergy status to narcotic agent status: Secondary | ICD-10-CM | POA: Diagnosis not present

## 2016-04-23 DIAGNOSIS — Z9010 Acquired absence of unspecified breast and nipple: Secondary | ICD-10-CM | POA: Insufficient documentation

## 2016-04-23 DIAGNOSIS — H2511 Age-related nuclear cataract, right eye: Secondary | ICD-10-CM | POA: Insufficient documentation

## 2016-04-23 DIAGNOSIS — Z853 Personal history of malignant neoplasm of breast: Secondary | ICD-10-CM | POA: Diagnosis not present

## 2016-04-23 DIAGNOSIS — E7800 Pure hypercholesterolemia, unspecified: Secondary | ICD-10-CM | POA: Insufficient documentation

## 2016-04-23 DIAGNOSIS — M810 Age-related osteoporosis without current pathological fracture: Secondary | ICD-10-CM | POA: Insufficient documentation

## 2016-04-23 DIAGNOSIS — H2513 Age-related nuclear cataract, bilateral: Secondary | ICD-10-CM | POA: Diagnosis not present

## 2016-04-23 HISTORY — PX: CATARACT EXTRACTION W/PHACO: SHX586

## 2016-04-23 HISTORY — DX: Age-related osteoporosis without current pathological fracture: M81.0

## 2016-04-23 HISTORY — DX: Pure hypercholesterolemia, unspecified: E78.00

## 2016-04-23 SURGERY — PHACOEMULSIFICATION, CATARACT, WITH IOL INSERTION
Anesthesia: Monitor Anesthesia Care | Site: Eye | Laterality: Right | Wound class: Clean

## 2016-04-23 MED ORDER — BRIMONIDINE TARTRATE 0.2 % OP SOLN
OPHTHALMIC | Status: DC | PRN
  Administered 2016-04-23: 1 [drp] via OPHTHALMIC

## 2016-04-23 MED ORDER — LACTATED RINGERS IV SOLN: INTRAVENOUS | Status: DC

## 2016-04-23 MED ORDER — CEFUROXIME OPHTHALMIC INJECTION 1 MG/0.1 ML
INJECTION | OPHTHALMIC | Status: DC | PRN
  Administered 2016-04-23: 0.1 mL via INTRACAMERAL

## 2016-04-23 MED ORDER — FENTANYL CITRATE (PF) 100 MCG/2ML IJ SOLN
INTRAMUSCULAR | Status: DC | PRN
  Administered 2016-04-23: 50 ug via INTRAVENOUS

## 2016-04-23 MED ORDER — NA HYALUR & NA CHOND-NA HYALUR 0.4-0.35 ML IO KIT
PACK | INTRAOCULAR | Status: DC | PRN
  Administered 2016-04-23: 1 mL via INTRAOCULAR

## 2016-04-23 MED ORDER — BALANCED SALT IO SOLN
INTRAOCULAR | Status: DC | PRN
  Administered 2016-04-23: 1 mL via OPHTHALMIC

## 2016-04-23 MED ORDER — EPINEPHRINE HCL 1 MG/ML IJ SOLN
INTRAOCULAR | Status: DC | PRN
  Administered 2016-04-23: 11:00:00 via OPHTHALMIC
  Administered 2016-04-23: 61 mL via OPHTHALMIC

## 2016-04-23 MED ORDER — ACETAMINOPHEN 325 MG PO TABS: 325.0000 mg | ORAL_TABLET | ORAL | Status: DC | PRN

## 2016-04-23 MED ORDER — TIMOLOL MALEATE 0.5 % OP SOLN
OPHTHALMIC | Status: DC | PRN
  Administered 2016-04-23: 1 [drp] via OPHTHALMIC

## 2016-04-23 MED ORDER — POVIDONE-IODINE 5 % OP SOLN
1.0000 "application " | OPHTHALMIC | Status: DC | PRN
  Administered 2016-04-23: 1 via OPHTHALMIC

## 2016-04-23 MED ORDER — TETRACAINE HCL 0.5 % OP SOLN
1.0000 [drp] | OPHTHALMIC | Status: DC | PRN
  Administered 2016-04-23: 1 [drp] via OPHTHALMIC

## 2016-04-23 MED ORDER — ACETAMINOPHEN 160 MG/5ML PO SOLN: 325.0000 mg | ORAL | Status: DC | PRN

## 2016-04-23 MED ORDER — ARMC OPHTHALMIC DILATING GEL
1.0000 "application " | OPHTHALMIC | Status: DC | PRN
  Administered 2016-04-23 (×2): 1 via OPHTHALMIC

## 2016-04-23 SURGICAL SUPPLY — 25 items
CANNULA ANT/CHMB 27GA (MISCELLANEOUS) ×2 IMPLANT
CARTRIDGE ABBOTT (MISCELLANEOUS) IMPLANT
GLOVE SURG LX 7.5 STRW (GLOVE) ×1
GLOVE SURG LX STRL 7.5 STRW (GLOVE) ×1 IMPLANT
GLOVE SURG TRIUMPH 8.0 PF LTX (GLOVE) ×2 IMPLANT
GOWN STRL REUS W/ TWL LRG LVL3 (GOWN DISPOSABLE) ×2 IMPLANT
GOWN STRL REUS W/TWL LRG LVL3 (GOWN DISPOSABLE) ×2
LENS IOL TECNIS ITEC 13.5 (Intraocular Lens) ×2 IMPLANT
MARKER SKIN DUAL TIP RULER LAB (MISCELLANEOUS) ×2 IMPLANT
NDL RETROBULBAR .5 NSTRL (NEEDLE) IMPLANT
NEEDLE FILTER BLUNT 18X 1/2SAF (NEEDLE) ×1
NEEDLE FILTER BLUNT 18X1 1/2 (NEEDLE) ×1 IMPLANT
PACK CATARACT BRASINGTON (MISCELLANEOUS) ×2 IMPLANT
PACK EYE AFTER SURG (MISCELLANEOUS) ×2 IMPLANT
PACK OPTHALMIC (MISCELLANEOUS) ×2 IMPLANT
RING MALYGIN 7.0 (MISCELLANEOUS) IMPLANT
SUT ETHILON 10-0 CS-B-6CS-B-6 (SUTURE)
SUT VICRYL  9 0 (SUTURE)
SUT VICRYL 9 0 (SUTURE) IMPLANT
SUTURE EHLN 10-0 CS-B-6CS-B-6 (SUTURE) IMPLANT
SYR 3ML LL SCALE MARK (SYRINGE) ×2 IMPLANT
SYR 5ML LL (SYRINGE) ×2 IMPLANT
SYR TB 1ML LUER SLIP (SYRINGE) ×2 IMPLANT
WATER STERILE IRR 250ML POUR (IV SOLUTION) ×2 IMPLANT
WIPE NON LINTING 3.25X3.25 (MISCELLANEOUS) ×2 IMPLANT

## 2016-04-23 NOTE — Anesthesia Postprocedure Evaluation (Signed)
Anesthesia Post Note  Patient: Rebecca Faulkner  Procedure(s) Performed: Procedure(s) (LRB): CATARACT EXTRACTION PHACO AND INTRAOCULAR LENS PLACEMENT (IOC) RIGHT EYE (Right)  Patient location during evaluation: PACU Anesthesia Type: MAC Level of consciousness: awake and alert and oriented Pain management: satisfactory to patient Vital Signs Assessment: post-procedure vital signs reviewed and stable Respiratory status: spontaneous breathing, nonlabored ventilation and respiratory function stable Cardiovascular status: blood pressure returned to baseline and stable Postop Assessment: Adequate PO intake and No signs of nausea or vomiting Anesthetic complications: no    Raliegh Ip

## 2016-04-23 NOTE — Anesthesia Procedure Notes (Signed)
Procedure Name: MAC Performed by: Matraca Hunkins Pre-anesthesia Checklist: Patient identified, Emergency Drugs available, Suction available, Timeout performed and Patient being monitored Patient Re-evaluated:Patient Re-evaluated prior to inductionOxygen Delivery Method: Nasal cannula Placement Confirmation: positive ETCO2     

## 2016-04-23 NOTE — Op Note (Signed)
LOCATION:  [place]   PREOPERATIVE DIAGNOSIS:    Nuclear sclerotic cataract right eye. H25.11   POSTOPERATIVE DIAGNOSIS:  Nuclear sclerotic cataract right eye.     PROCEDURE:  Phacoemusification with posterior chamber intraocular lens placement of the right eye   LENS:   Implant Name Type Inv. Item Serial No. Manufacturer Lot No. LRB No. Used  LENS IOL DIOP 13.5 - [id number] Intraocular Lens LENS IOL DIOP 13.5 [id number]   Right 1        ULTRASOUND TIME: 21 % of 1 minutes, 17 seconds.  CDE 16.6   SURGEON:  Wyonia Hough, MD   ANESTHESIA:  Topical with tetracaine drops and 2% Xylocaine jelly, augmented with 1% preservative-free intracameral lidocaine.    COMPLICATIONS:  None.   DESCRIPTION OF PROCEDURE:  The patient was identified in the holding room and transported to the operating room and placed in the supine position under the operating microscope.  The right eye was identified as the operative eye and it was prepped and draped in the usual sterile ophthalmic fashion.   A 1 millimeter clear-corneal paracentesis was made at the 12:00 position.  0.5 ml of preservative-free 1% lidocaine was injected into the anterior chamber. The anterior chamber was filled with Viscoat viscoelastic.  A 2.4 millimeter keratome was used to make a near-clear corneal incision at the 9:00 position.  A curvilinear capsulorrhexis was made with a cystotome and capsulorrhexis forceps.  Balanced salt solution was used to hydrodissect and hydrodelineate the nucleus.   Phacoemulsification was then used in stop and chop fashion to remove the lens nucleus and epinucleus.  The remaining cortex was then removed using the irrigation and aspiration handpiece. Provisc was then placed into the capsular bag to distend it for lens placement.  A lens was then injected into the capsular bag.  The remaining viscoelastic was aspirated.   Wounds were hydrated with balanced salt solution.  The anterior  chamber was inflated to a physiologic pressure with balanced salt solution.  No wound leaks were noted. Cefuroxime 0.1 ml of a 10mg /ml solution was injected into the anterior chamber for a dose of 1 mg of intracameral antibiotic at the completion of the case.   Timolol and Brimonidine drops were applied to the eye.  The patient was taken to the recovery room in stable condition without complications of anesthesia or surgery.   Savalas Monje 04/23/2016, 11:11 AM

## 2016-04-23 NOTE — Transfer of Care (Signed)
Immediate Anesthesia Transfer of Care Note  Patient: Rebecca Faulkner  Procedure(s) Performed: Procedure(s): CATARACT EXTRACTION PHACO AND INTRAOCULAR LENS PLACEMENT (IOC) RIGHT EYE (Right)  Patient Location: PACU  Anesthesia Type: MAC  Level of Consciousness: awake, alert  and patient cooperative  Airway and Oxygen Therapy: Patient Spontanous Breathing and Patient connected to supplemental oxygen  Post-op Assessment: Post-op Vital signs reviewed, Patient's Cardiovascular Status Stable, Respiratory Function Stable, Patent Airway and No signs of Nausea or vomiting  Post-op Vital Signs: Reviewed and stable  Complications: No apparent anesthesia complications

## 2016-04-23 NOTE — H&P (Signed)
  The History and Physical notes are on paper, have been signed, and are to be scanned. The patient remains stable and unchanged from the H&P.   Previous H&P reviewed, patient examined, and there are no changes.  Rebecca Faulkner 04/23/2016 9:40 AM

## 2016-04-23 NOTE — Anesthesia Preprocedure Evaluation (Signed)
Anesthesia Evaluation  Patient identified by MRN, date of birth, ID band  Reviewed: Allergy & Precautions, H&P , NPO status , Patient's Chart, lab work & pertinent test results  Airway Mallampati: III  TM Distance: >3 FB Neck ROM: full    Dental  (+) Poor Dentition   Pulmonary    Pulmonary exam normal        Cardiovascular  Rhythm:regular Rate:Normal     Neuro/Psych    GI/Hepatic   Endo/Other    Renal/GU      Musculoskeletal   Abdominal   Peds  Hematology   Anesthesia Other Findings   Reproductive/Obstetrics                             Anesthesia Physical Anesthesia Plan  ASA: II  Anesthesia Plan: MAC   Post-op Pain Management:    Induction:   Airway Management Planned:   Additional Equipment:   Intra-op Plan:   Post-operative Plan:   Informed Consent: I have reviewed the patients History and Physical, chart, labs and discussed the procedure including the risks, benefits and alternatives for the proposed anesthesia with the patient or authorized representative who has indicated his/her understanding and acceptance.     Plan Discussed with: CRNA  Anesthesia Plan Comments:         Anesthesia Quick Evaluation

## 2016-04-24 ENCOUNTER — Encounter: Payer: Self-pay | Admitting: Ophthalmology

## 2016-05-12 ENCOUNTER — Ambulatory Visit
Admission: RE | Admit: 2016-05-12 | Discharge: 2016-05-12 | Disposition: A | Discharge status: Home / Self Care | Payer: Medicare Other | Source: Ambulatory Visit | Attending: Oncology | Admitting: Oncology

## 2016-05-12 ENCOUNTER — Other Ambulatory Visit: Payer: Self-pay | Admitting: *Deleted

## 2016-05-12 ENCOUNTER — Other Ambulatory Visit: Payer: Self-pay | Admitting: Oncology

## 2016-05-12 ENCOUNTER — Encounter: Payer: Self-pay | Admitting: *Deleted

## 2016-05-12 DIAGNOSIS — Z1231 Encounter for screening mammogram for malignant neoplasm of breast: Secondary | ICD-10-CM | POA: Diagnosis not present

## 2016-05-12 DIAGNOSIS — Z780 Asymptomatic menopausal state: Secondary | ICD-10-CM | POA: Diagnosis not present

## 2016-05-12 DIAGNOSIS — Z853 Personal history of malignant neoplasm of breast: Secondary | ICD-10-CM

## 2016-05-12 DIAGNOSIS — M810 Age-related osteoporosis without current pathological fracture: Secondary | ICD-10-CM | POA: Diagnosis not present

## 2016-05-12 MED ORDER — ALENDRONATE SODIUM 70 MG PO TABS: 70.0000 mg | ORAL_TABLET | ORAL | Status: DC

## 2016-05-19 ENCOUNTER — Encounter: Payer: Self-pay | Admitting: General Surgery

## 2016-05-19 ENCOUNTER — Ambulatory Visit: Payer: Medicare Other | Admitting: General Surgery

## 2016-05-19 ENCOUNTER — Ambulatory Visit (INDEPENDENT_AMBULATORY_CARE_PROVIDER_SITE_OTHER): Payer: Medicare Other | Admitting: General Surgery

## 2016-05-19 VITALS — BP 136/78 | HR 72 | Resp 14 | Ht 60.0 in | Wt 138.0 lb

## 2016-05-19 DIAGNOSIS — Z853 Personal history of malignant neoplasm of breast: Secondary | ICD-10-CM

## 2016-05-19 NOTE — Patient Instructions (Signed)
Patient will be asked to return to the office in one year with a left breast diagnotic mammogram.

## 2016-05-19 NOTE — Progress Notes (Signed)
Patient ID: Rebecca Faulkner, female   DOB: 1940/11/07, 75 y.o.   MRN: [id number]  Chief Complaint  Patient presents with  . Follow-up    left breast mammogram     HPI Rebecca Faulkner is a 75 y.o. female who presents for a breast cancer follow up The most recent mammogram was done on 05/12/16.  Patient does perform regular self breast checks and gets regular mammograms done.   I have reviewed the history of present illness with the patient.   HPI  Past Medical History:  Diagnosis Date  . Breast cancer ([place]) 2013   right breast  . Heart murmur 1990  . Hypercholesteremia   . Lump or mass in breast   . Malignant neoplasm of upper-outer quadrant of female breast ([place]) 2013   R-breast T1, NO, MO, ER/PR pos., Her 2 neg.  . Osteoporosis   . Ulcer 1974    Past Surgical History:  Procedure Laterality Date  . BREAST SURGERY Right 2013  . CATARACT EXTRACTION W/PHACO Right 04/23/2016   Procedure: CATARACT EXTRACTION PHACO AND INTRAOCULAR LENS PLACEMENT ([place]) RIGHT EYE;  Surgeon: Leandrew Koyanagi, MD;  Location: [place];  Service: Ophthalmology;  Laterality: Right;  . [place]  . FRACTURE SURGERY Left 2011   arm, plate and 12 screws  . LEG SURGERY Right 01/02/2015  . MASTECTOMY Right 2013  . Right inguinal hernia repair  2013  . VEIN SURGERY Left 2015    Family History  Problem Relation Age of Onset  . Breast cancer Neg Hx     Social History Social History  Substance Use Topics  . Smoking status: Never Smoker  . Smokeless tobacco: Never Used  . Alcohol use 25.2 oz/week    42 Cans of beer per week    Allergies  Allergen Reactions  . Codeine Nausea Only    Current Outpatient Prescriptions  Medication Sig Dispense Refill  . alendronate (FOSAMAX) 70 MG tablet Take 1 tablet (70 mg total) by mouth once a week. 12 tablet 2  . aspirin EC 81 MG tablet Take 81 mg by mouth daily.    . brimonidine (ALPHAGAN) 0.2 % ophthalmic solution 2 (two) times  daily.    . brompheniramine-pseudoephedrine-DM 30-2-10 MG/5ML syrup Take 5 mLs by mouth 4 (four) times daily as needed. 120 mL 0  . calcium carbonate (OS-CAL) 600 MG TABS Take 600 mg by mouth 2 (two) times daily with a meal.    . doxycycline (MONODOX) 100 MG capsule Take 1 capsule (100 mg total) by mouth 2 (two) times daily. 20 capsule 0  . latanoprost (XALATAN) 0.005 % ophthalmic solution Place 1 drop into both eyes at bedtime. Reported on 04/17/2016  2  . letrozole (FEMARA) 2.5 MG tablet Take 1 tablet (2.5 mg total) by mouth daily. 30 tablet 2  . lidocaine (LIDODERM) 5 % Place 1 patch onto the skin daily. Reported on 04/17/2016    . timolol (BETIMOL) 0.5 % ophthalmic solution Place 1 drop into both eyes daily. Reported on 04/17/2016     No current facility-administered medications for this visit.     Review of Systems Review of Systems  Constitutional: Negative.   Respiratory: Negative.   Cardiovascular: Negative.     Blood pressure 136/78, pulse 72, resp. rate 14, height 5' (1.524 m), weight 138 lb (62.6 kg).  Physical Exam Physical Exam  Constitutional: She is oriented to person, place, and time. She appears well-developed and well-nourished.  Eyes: Conjunctivae are normal. No  scleral icterus.  Neck: Neck supple.  Cardiovascular: Normal rate, regular rhythm and normal heart sounds.   Pulses:      Dorsalis pedis pulses are 2+ on the right side, and 2+ on the left side.       Posterior tibial pulses are 2+ on the right side, and 2+ on the left side.  Pulmonary/Chest: Effort normal and breath sounds normal. Left breast exhibits no inverted nipple, no mass, no nipple discharge, no skin change and no tenderness.  Right mastectomy site well healed, no sign of local recurrence  Abdominal: Soft. Normal appearance and bowel sounds are normal. There is no hepatomegaly. There is no tenderness. No hernia.  Lymphadenopathy:    She has no cervical adenopathy.    She has no axillary adenopathy.   Neurological: She is alert and oriented to person, place, and time.  Skin: Skin is warm and dry.    Data Reviewed Mammogram reviewed   Assessment    4 years post right mastectomy/SN biopsy, Stable exam. Patient is on Letrozole and doing well    Plan    Patient will be asked to return to the office in one year with a left breast diagnotic mammogram.    This information has been scribed by Gaspar Cola CMA. PCP:  Steele Sizer, MD  Christene Lye 05/19/2016, 11:46 AM

## 2016-05-28 DIAGNOSIS — H2512 Age-related nuclear cataract, left eye: Secondary | ICD-10-CM | POA: Diagnosis not present

## 2016-05-29 ENCOUNTER — Encounter: Payer: Self-pay | Admitting: *Deleted

## 2016-05-30 NOTE — Discharge Instructions (Signed)

## 2016-06-04 ENCOUNTER — Ambulatory Visit
Admission: RE | Admit: 2016-06-04 | Discharge: 2016-06-04 | Disposition: A | Discharge status: Home / Self Care | Payer: Medicare Other | Source: Ambulatory Visit | Attending: Ophthalmology | Admitting: Ophthalmology

## 2016-06-04 ENCOUNTER — Ambulatory Visit: Payer: Medicare Other | Admitting: Student in an Organized Health Care Education/Training Program

## 2016-06-04 ENCOUNTER — Encounter
Admission: RE | Disposition: A | Discharge status: Home / Self Care | Payer: Self-pay | Source: Ambulatory Visit | Attending: Ophthalmology

## 2016-06-04 ENCOUNTER — Encounter: Payer: Self-pay | Admitting: *Deleted

## 2016-06-04 DIAGNOSIS — H2512 Age-related nuclear cataract, left eye: Secondary | ICD-10-CM | POA: Diagnosis not present

## 2016-06-04 DIAGNOSIS — E7800 Pure hypercholesterolemia, unspecified: Secondary | ICD-10-CM | POA: Diagnosis not present

## 2016-06-04 DIAGNOSIS — Z7982 Long term (current) use of aspirin: Secondary | ICD-10-CM | POA: Insufficient documentation

## 2016-06-04 DIAGNOSIS — R011 Cardiac murmur, unspecified: Secondary | ICD-10-CM | POA: Diagnosis not present

## 2016-06-04 DIAGNOSIS — M810 Age-related osteoporosis without current pathological fracture: Secondary | ICD-10-CM | POA: Insufficient documentation

## 2016-06-04 DIAGNOSIS — Z853 Personal history of malignant neoplasm of breast: Secondary | ICD-10-CM | POA: Insufficient documentation

## 2016-06-04 HISTORY — PX: CATARACT EXTRACTION W/PHACO: SHX586

## 2016-06-04 SURGERY — PHACOEMULSIFICATION, CATARACT, WITH IOL INSERTION
Anesthesia: Monitor Anesthesia Care | Laterality: Left | Wound class: Clean

## 2016-06-04 MED ORDER — TETRACAINE HCL 0.5 % OP SOLN
1.0000 [drp] | OPHTHALMIC | Status: DC | PRN
  Administered 2016-06-04: 1 [drp] via OPHTHALMIC

## 2016-06-04 MED ORDER — NA HYALUR & NA CHOND-NA HYALUR 0.4-0.35 ML IO KIT
PACK | INTRAOCULAR | Status: DC | PRN
  Administered 2016-06-04: 1 mL via INTRAOCULAR

## 2016-06-04 MED ORDER — BALANCED SALT IO SOLN
INTRAOCULAR | Status: DC | PRN
  Administered 2016-06-04: 1 mL via OPHTHALMIC

## 2016-06-04 MED ORDER — MIDAZOLAM HCL 2 MG/2ML IJ SOLN
INTRAMUSCULAR | Status: DC | PRN
  Administered 2016-06-04 (×2): 1 mg via INTRAVENOUS

## 2016-06-04 MED ORDER — FENTANYL CITRATE (PF) 100 MCG/2ML IJ SOLN
INTRAMUSCULAR | Status: DC | PRN
  Administered 2016-06-04: 50 ug via INTRAVENOUS

## 2016-06-04 MED ORDER — ARMC OPHTHALMIC DILATING GEL
1.0000 "application " | OPHTHALMIC | Status: DC | PRN
  Administered 2016-06-04 (×2): 1 via OPHTHALMIC

## 2016-06-04 MED ORDER — CEFUROXIME OPHTHALMIC INJECTION 1 MG/0.1 ML
INJECTION | OPHTHALMIC | Status: DC | PRN
  Administered 2016-06-04: 0.1 mL via INTRACAMERAL

## 2016-06-04 MED ORDER — EPINEPHRINE HCL 1 MG/ML IJ SOLN
INTRAOCULAR | Status: DC | PRN
  Administered 2016-06-04: 75 mL via OPHTHALMIC

## 2016-06-04 MED ORDER — TIMOLOL MALEATE 0.5 % OP SOLN
OPHTHALMIC | Status: DC | PRN
  Administered 2016-06-04: 1 [drp] via OPHTHALMIC

## 2016-06-04 MED ORDER — BRIMONIDINE TARTRATE 0.2 % OP SOLN
OPHTHALMIC | Status: DC | PRN
  Administered 2016-06-04: 1 [drp] via OPHTHALMIC

## 2016-06-04 MED ORDER — POVIDONE-IODINE 5 % OP SOLN
1.0000 "application " | OPHTHALMIC | Status: DC | PRN
  Administered 2016-06-04: 1 via OPHTHALMIC

## 2016-06-04 SURGICAL SUPPLY — 25 items
CANNULA ANT/CHMB 27GA (MISCELLANEOUS) ×2 IMPLANT
CARTRIDGE ABBOTT (MISCELLANEOUS) IMPLANT
GLOVE SURG LX 7.5 STRW (GLOVE) ×1
GLOVE SURG LX STRL 7.5 STRW (GLOVE) ×1 IMPLANT
GLOVE SURG TRIUMPH 8.0 PF LTX (GLOVE) ×2 IMPLANT
GOWN STRL REUS W/ TWL LRG LVL3 (GOWN DISPOSABLE) ×2 IMPLANT
GOWN STRL REUS W/TWL LRG LVL3 (GOWN DISPOSABLE) ×2
LENS IOL TECNIS ITEC 14.5 (Intraocular Lens) ×2 IMPLANT
MARKER SKIN DUAL TIP RULER LAB (MISCELLANEOUS) ×2 IMPLANT
NDL RETROBULBAR .5 NSTRL (NEEDLE) IMPLANT
NEEDLE FILTER BLUNT 18X 1/2SAF (NEEDLE) ×1
NEEDLE FILTER BLUNT 18X1 1/2 (NEEDLE) ×1 IMPLANT
PACK CATARACT BRASINGTON (MISCELLANEOUS) ×2 IMPLANT
PACK EYE AFTER SURG (MISCELLANEOUS) ×2 IMPLANT
PACK OPTHALMIC (MISCELLANEOUS) ×2 IMPLANT
RING MALYGIN 7.0 (MISCELLANEOUS) IMPLANT
SUT ETHILON 10-0 CS-B-6CS-B-6 (SUTURE)
SUT VICRYL  9 0 (SUTURE)
SUT VICRYL 9 0 (SUTURE) IMPLANT
SUTURE EHLN 10-0 CS-B-6CS-B-6 (SUTURE) IMPLANT
SYR 3ML LL SCALE MARK (SYRINGE) ×2 IMPLANT
SYR 5ML LL (SYRINGE) ×2 IMPLANT
SYR TB 1ML LUER SLIP (SYRINGE) ×2 IMPLANT
WATER STERILE IRR 250ML POUR (IV SOLUTION) ×2 IMPLANT
WIPE NON LINTING 3.25X3.25 (MISCELLANEOUS) ×2 IMPLANT

## 2016-06-04 NOTE — Anesthesia Preprocedure Evaluation (Signed)
Anesthesia Evaluation  Patient identified by MRN, date of birth, ID band Patient awake    Reviewed: Allergy & Precautions, NPO status , Patient's Chart, lab work & pertinent test results  Airway Mallampati: II  TM Distance: >3 FB Neck ROM: Full    Dental no notable dental hx.    Pulmonary neg pulmonary ROS,    Pulmonary exam normal        Cardiovascular negative cardio ROS Normal cardiovascular exam     Neuro/Psych negative neurological ROS  negative psych ROS   GI/Hepatic negative GI ROS, Neg liver ROS,   Endo/Other  negative endocrine ROS  Renal/GU negative Renal ROS     Musculoskeletal negative musculoskeletal ROS (+)   Abdominal   Peds  Hematology H/O breast Ca.   Anesthesia Other Findings   Reproductive/Obstetrics                             Anesthesia Physical Anesthesia Plan  ASA: II  Anesthesia Plan: MAC   Post-op Pain Management:    Induction: Intravenous  Airway Management Planned:   Additional Equipment:   Intra-op Plan:   Post-operative Plan:   Informed Consent: I have reviewed the patients History and Physical, chart, labs and discussed the procedure including the risks, benefits and alternatives for the proposed anesthesia with the patient or authorized representative who has indicated his/her understanding and acceptance.     Plan Discussed with: CRNA  Anesthesia Plan Comments:         Anesthesia Quick Evaluation

## 2016-06-04 NOTE — Op Note (Signed)
OPERATIVE NOTE  Rebecca Faulkner [id number] 06/04/2016   PREOPERATIVE DIAGNOSIS:  Nuclear sclerotic cataract left eye. H25.12   POSTOPERATIVE DIAGNOSIS:    Nuclear sclerotic cataract left eye.     PROCEDURE:  Phacoemusification with posterior chamber intraocular lens placement of the left eye   LENS:   Implant Name Type Inv. Item Serial No. Manufacturer Lot No. LRB No. Used  LENS IOL DIOP 14.5 - [id number] Intraocular Lens LENS IOL DIOP 14.5 [id number]   Left 1        ULTRASOUND TIME: 14  % of 1 minutes 31 seconds, CDE 12.9  SURGEON:  Wyonia Hough, MD   ANESTHESIA:  Topical with tetracaine drops and 2% Xylocaine jelly, augmented with 1% preservative-free intracameral lidocaine.    COMPLICATIONS:  None.   DESCRIPTION OF PROCEDURE:  The patient was identified in the holding room and transported to the operating room and placed in the supine position under the operating microscope.  The left eye was identified as the operative eye and it was prepped and draped in the usual sterile ophthalmic fashion.   A 1 millimeter clear-corneal paracentesis was made at the 1:30 position.  0.5 ml of preservative-free 1% lidocaine was injected into the anterior chamber.  The anterior chamber was filled with Viscoat viscoelastic.  A 2.4 millimeter keratome was used to make a near-clear corneal incision at the 10:30 position.  .  A curvilinear capsulorrhexis was made with a cystotome and capsulorrhexis forceps.  Balanced salt solution was used to hydrodissect and hydrodelineate the nucleus.   Phacoemulsification was then used in stop and chop fashion to remove the lens nucleus and epinucleus.  The remaining cortex was then removed using the irrigation and aspiration handpiece. Provisc was then placed into the capsular bag to distend it for lens placement.  A lens was then injected into the capsular bag.  The remaining viscoelastic was aspirated.   Wounds were hydrated with balanced salt  solution.  The anterior chamber was inflated to a physiologic pressure with balanced salt solution.  No wound leaks were noted. Cefuroxime 0.1 ml of a 10mg /ml solution was injected into the anterior chamber for a dose of 1 mg of intracameral antibiotic at the completion of the case.   Timolol and Brimonidine drops were applied to the eye.  The patient was taken to the recovery room in stable condition without complications of anesthesia or surgery.  [name] 06/04/2016, 8:37 AM  \

## 2016-06-04 NOTE — H&P (Signed)
  The History and Physical notes are on paper, have been signed, and are to be scanned. The patient remains stable and unchanged from the H&P.   Previous H&P reviewed, patient examined, and there are no changes.  Katherine Tout 06/04/2016 8:07 AM

## 2016-06-04 NOTE — Anesthesia Postprocedure Evaluation (Signed)
Anesthesia Post Note  Patient: Rebecca Faulkner  Procedure(s) Performed: Procedure(s) (LRB): CATARACT EXTRACTION PHACO AND INTRAOCULAR LENS PLACEMENT ([facility]) (Left)  Patient location during evaluation: PACU Anesthesia Type: MAC Level of consciousness: awake and alert and oriented Pain management: pain level controlled Vital Signs Assessment: post-procedure vital signs reviewed and stable Respiratory status: spontaneous breathing and nonlabored ventilation Cardiovascular status: stable Postop Assessment: no signs of nausea or vomiting and adequate PO intake Anesthetic complications: no    Estill Batten

## 2016-06-04 NOTE — Anesthesia Procedure Notes (Signed)
Procedure Name: MAC Date/Time: 06/04/2016 8:22 AM Performed by: Janna Arch Pre-anesthesia Checklist: Patient identified, Emergency Drugs available, Suction available, Patient being monitored and Timeout performed Patient Re-evaluated:Patient Re-evaluated prior to inductionOxygen Delivery Method: Nasal cannula

## 2016-06-04 NOTE — Transfer of Care (Signed)
Immediate Anesthesia Transfer of Care Note  Patient: Rebecca Faulkner  Procedure(s) Performed: Procedure(s): CATARACT EXTRACTION PHACO AND INTRAOCULAR LENS PLACEMENT ([facility]) (Left)  Patient Location: PACU  Anesthesia Type: MAC  Level of Consciousness: awake, alert  and patient cooperative  Airway and Oxygen Therapy: Patient Spontanous Breathing and Patient connected to supplemental oxygen  Post-op Assessment: Post-op Vital signs reviewed, Patient's Cardiovascular Status Stable, Respiratory Function Stable, Patent Airway and No signs of Nausea or vomiting  Post-op Vital Signs: Reviewed and stable  Complications: No apparent anesthesia complications

## 2016-06-05 ENCOUNTER — Encounter: Payer: Self-pay | Admitting: Ophthalmology

## 2016-06-23 ENCOUNTER — Other Ambulatory Visit: Payer: Medicare Other

## 2016-06-23 ENCOUNTER — Ambulatory Visit: Payer: Medicare Other | Admitting: Hematology and Oncology

## 2016-06-27 ENCOUNTER — Other Ambulatory Visit: Payer: Self-pay

## 2016-06-27 DIAGNOSIS — Z853 Personal history of malignant neoplasm of breast: Secondary | ICD-10-CM

## 2016-06-30 ENCOUNTER — Other Ambulatory Visit: Payer: Self-pay | Admitting: Hematology and Oncology

## 2016-06-30 DIAGNOSIS — C50911 Malignant neoplasm of unspecified site of right female breast: Secondary | ICD-10-CM

## 2016-06-30 NOTE — Progress Notes (Signed)
Rebecca Faulkner Clinic day:  07/01/2016  Chief Complaint: Rebecca Faulkner is a 75 y.o. female with stage IA right breast cancer who is seen for reassessment.  HPI:   The patient presented with breast cancer in 03/2012 when Dr. Ancil Boozer found a lump in her breast.  She underwent right modified radical mastectomy with sentinel lymph node biopsy on 05/27/2012 by Dr. Jamal Collin.  Pathology revealed a 1.8 cm grade I invasive carcinoma.  There was angiolymphatic invasion.  There was low grade DCIS.  One sentinel node and 5 non-sentinel nodes were removed.  Zero of 6 nodes were positive.  Tumor was ER + (90%).  PR + (90%).  HER-2 receptor - by FISH.  Pathologic stage was T1cN0M0 breast cancer.  Oncotype DX score was 10 which translated into a 7% chance of recurrent disease in 10 years of tamoxifen.  She was started on Femara in 07/2012.  She was admitted with a right femur fracture s/p fall in 12/2014.  She was on Climara, calcium and vitamin D.  She has been taking Fosamax x 2 years.  Bone density on 08/08/2014 revealed osteoporosis with a T score of -2.9 in the left femur, -1.6 in the left forearm radius.  Bone density on 05/12/2016 revealed osteoporosis with a T score of -2.7 in the left femur, -1.4 in the left forearm radius.  The patient was last seen by Dr. Oliva Bustard on 12/25/2015.  At that time, she was doing well with no evidence of disease.  Left mammogram on 05/12/2016 revealed no evidence of malignancy.  Symptomatically, she feels good.  She states that she gets tired a little bit.  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  She is on her feet a lot.   Past Medical History:  Diagnosis Date  . Breast cancer ([place]) 2013   right breast  . Heart murmur 1990  . Hypercholesteremia   . Lump or mass in breast   . Malignant neoplasm of upper-outer quadrant of female breast ([place]) 2013   R-breast T1, NO, MO, ER/PR pos., Her 2  neg.  . Osteoporosis   . Ulcer 1974    Past Surgical History:  Procedure Laterality Date  . BREAST SURGERY Right 2013  . CATARACT EXTRACTION W/PHACO Right 04/23/2016   Procedure: CATARACT EXTRACTION PHACO AND INTRAOCULAR LENS PLACEMENT ([place]) RIGHT EYE;  Surgeon: Leandrew Koyanagi, MD;  Location: [facility];  Service: Ophthalmology;  Laterality: Right;  . CATARACT EXTRACTION W/PHACO Left 06/04/2016   Procedure: CATARACT EXTRACTION PHACO AND INTRAOCULAR LENS PLACEMENT ([facility]);  Surgeon: Leandrew Koyanagi, MD;  Location: [facility];  Service: Ophthalmology;  Laterality: Left;  . [place]  . FRACTURE SURGERY Left 2011   arm, plate and 12 screws  . LEG SURGERY Right 01/02/2015  . MASTECTOMY Right 2013  . Right inguinal hernia repair  2013  . VEIN SURGERY Left 2015    Family History  Problem Relation Age of Onset  . Breast cancer Neg Hx     Social History:  reports that she has never smoked. She has never used smokeless tobacco. She reports that she drinks about 25.2 oz of alcohol per week . She reports that she does not use drugs.  She drinks 4-5 beers/day.  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  She lives alone in [place].  The patient is alone today.  Allergies:  Allergies  Allergen Reactions  . Codeine Nausea Only    Current Medications: Current Outpatient Prescriptions  Medication Sig Dispense Refill  . alendronate (FOSAMAX) 70 MG tablet Take 1 tablet (70 mg total) by mouth once a week. 12 tablet 2  . aspirin EC 81 MG tablet Take 81 mg by mouth daily.    . brimonidine (ALPHAGAN) 0.2 % ophthalmic solution 2 (two) times daily.    . brompheniramine-pseudoephedrine-DM 30-2-10 MG/5ML syrup Take 5 mLs by mouth 4 (four) times daily as needed. 120 mL 0  . calcium carbonate (OS-CAL) 600 MG TABS Take 600 mg by mouth 2 (two) times daily with a meal.    . doxycycline (MONODOX) 100 MG capsule Take 1 capsule (100 mg total) by  mouth 2 (two) times daily. 20 capsule 0  . latanoprost (XALATAN) 0.005 % ophthalmic solution Place 1 drop into both eyes at bedtime. Reported on 04/17/2016  2  . letrozole (FEMARA) 2.5 MG tablet Take 1 tablet (2.5 mg total) by mouth daily. 30 tablet 2  . lidocaine (LIDODERM) 5 % Place 1 patch onto the skin daily. Reported on 04/17/2016    . timolol (BETIMOL) 0.5 % ophthalmic solution Place 1 drop into both eyes daily. Reported on 04/17/2016    . prednisoLONE acetate (PRED FORTE) 1 % ophthalmic suspension   0   No current facility-administered medications for this visit.     Review of Systems:  GENERAL:  Feels good.  Gets tired.  No fevers, sweats or weight loss. PERFORMANCE STATUS (ECOG): 0 HEENT:  No visual changes, runny nose, sore throat, mouth sores or tenderness. Lungs: No shortness of breath or cough.  No hemoptysis. Cardiac:  No chest pain, palpitations, orthopnea, or PND. GI:  No nausea, vomiting, diarrhea, constipation, melena or hematochezia.  Never had a colonscopy. GU:  No urgency, frequency, dysuria, or hematuria. Musculoskeletal:  No back pain.  No joint pain.  No muscle tenderness. Extremities:  No pain or swelling. Skin:  No rashes or skin changes. Neuro:  No headache, numbness or weakness, balance or coordination issues. Endocrine:  No diabetes, thyroid issues, hot flashes or night sweats. Psych:  No mood changes, depression or anxiety. Pain:  No focal pain. Review of systems:  All other systems reviewed and found to be negative.  Physical Exam: Blood pressure 127/88, pulse (!) 58, temperature (!) 95.9 F (35.5 C), temperature source Tympanic, resp. rate 18, weight 126 lb 1.7 oz (57.2 kg). GENERAL:  Well developed, well nourished, woman sitting comfortably in the exam room in no acute distress.  She has a cane at her side. MENTAL STATUS:  Alert and oriented to person, place and time. HEAD:  Curly gray hair.  Normocephalic, atraumatic, face symmetric, no Cushingoid  features. EYES:  Brown eyes.  Pupils equal round and reactive to light and accomodation.  No conjunctivitis or scleral icterus. ENT:  Oropharynx clear without lesion.  Tongue normal. Mucous membranes moist.  RESPIRATORY:  Clear to auscultation without rales, wheezes or rhonchi. CARDIOVASCULAR:  Regular rate and rhythm without murmur, rub or gallop. BREAST:  Right mastectomy without erythema or nodularity.  Left breast with medial fibrocystic changes.  No discrete masses, skin changes or nipple discharge.  ABDOMEN:  Soft, non-tender, with active bowel sounds, and no hepatosplenomegaly.  No masses. SKIN:  No rashes, ulcers or lesions. EXTREMITIES: No edema, no skin discoloration or tenderness.  No palpable cords. LYMPH NODES: No palpable cervical, supraclavicular, axillary or inguinal adenopathy  NEUROLOGICAL: Unremarkable. PSYCH:  Appropriate.  Appointment on 07/01/2016  Component Date Value Ref Range Status  . WBC 07/01/2016 5.1  3.6 - 11.0 K/uL Final  . RBC 07/01/2016 4.37  3.80 - 5.20 MIL/uL Final  . Hemoglobin 07/01/2016 13.7  12.0 - 16.0 g/dL Final  . HCT 07/01/2016 39.7  35.0 - 47.0 % Final  . MCV 07/01/2016 90.8  80.0 - 100.0 fL Final  . MCH 07/01/2016 31.3  26.0 - 34.0 pg Final  . MCHC 07/01/2016 34.5  32.0 - 36.0 g/dL Final  . RDW 07/01/2016 13.8  11.5 - 14.5 % Final  . Platelets 07/01/2016 206  150 - 440 K/uL Final  . Neutrophils Relative % 07/01/2016 62  % Final  . Neutro Abs 07/01/2016 3.2  1.4 - 6.5 K/uL Final  . Lymphocytes Relative 07/01/2016 21  % Final  . Lymphs Abs 07/01/2016 1.1  1.0 - 3.6 K/uL Final  . Monocytes Relative 07/01/2016 15  % Final  . Monocytes Absolute 07/01/2016 0.8  0.2 - 0.9 K/uL Final  . Eosinophils Relative 07/01/2016 1  % Final  . Eosinophils Absolute 07/01/2016 0.0  0 - 0.7 K/uL Final  . Basophils Relative 07/01/2016 1  % Final  . Basophils Absolute 07/01/2016 0.0  0 - 0.1 K/uL Final  . Sodium 07/01/2016 138  135 - 145 mmol/L Final  .  Potassium 07/01/2016 4.6  3.5 - 5.1 mmol/L Final  . Chloride 07/01/2016 108  101 - 111 mmol/L Final  . CO2 07/01/2016 24  22 - 32 mmol/L Final  . Glucose, Bld 07/01/2016 111* 65 - 99 mg/dL Final  . BUN 07/01/2016 10  6 - 20 mg/dL Final  . Creatinine, Ser 07/01/2016 0.51  0.44 - 1.00 mg/dL Final  . Calcium 07/01/2016 8.9  8.9 - 10.3 mg/dL Final  . Total Protein 07/01/2016 6.9  6.5 - 8.1 g/dL Final  . Albumin 07/01/2016 4.1  3.5 - 5.0 g/dL Final  . AST 07/01/2016 16  15 - 41 U/L Final  . ALT 07/01/2016 10* 14 - 54 U/L Final  . Alkaline Phosphatase 07/01/2016 39  38 - 126 U/L Final  . Total Bilirubin 07/01/2016 0.4  0.3 - 1.2 mg/dL Final  . GFR calc non Af Amer 07/01/2016 >60  >60 mL/min Final  . GFR calc Af Amer 07/01/2016 >60  >60 mL/min Final   Comment: (NOTE) The eGFR has been calculated using the CKD EPI equation. This calculation has not been validated in all clinical situations. eGFR's persistently <60 mL/min signify possible Chronic Kidney Disease.   Georgiann Hahn gap 07/01/2016 6  5 - 15 Final    Assessment:  AMBERLEY HAMLER is a 75 y.o. female with stage IA right breast cancer s/p right modified radical mastectomy with sentinel lymph node biopsy on 05/27/2012.  Pathology revealed a 1.8 cm grade I invasive carcinoma.  There was angiolymphatic invasion.  There was low grade DCIS.  One sentinel node and 5 non-sentinel nodes were removed.  Zero of 6 nodes were positive.  Tumor was ER + (90%).  PR + (90%).  HER-2 receptor - by FISH.  Pathologic stage was T1cN0M0 breast cancer.  Oncotype DX score was 10 which translated into a 7% chance of recurrent disease in 10 years of tamoxifen.  She was started on Femara in 07/2012.  She was admitted with a right femur fracture s/p fall in 12/2014.  She was on Climara, calcium and vitamin D.  She has been taking Fosamax x 2 years.  Bone density on 08/08/2014 revealed osteoporosis with a T  score of -2.9 in the left femur, -1.6 in the left forearm  radius.  Bone density on 05/12/2016 revealed osteoporosis with a T score of -2.7 in the left femur, -1.4 in the left forearm radius.  Left mammogram on 05/12/2016 revealed no evidence of malignancy.  Symptomatically, she feels good.  Exam is unremarkable.  Plan: 1.  Review entire medical history, diagaosis and management of breast cancer.  Discuss Oncotype DX score.  Discuss continuation of hormonal therapy for at least 5 years.  Discuss consideration of BCI testing at 4 1/2 years to assess the benefit of extended adjuvant hormonal therapy (years 5-10). 2.  Discuss bone density study.  Discuss importance of calcium and vitamin D.  Discuss Fosamax. 3.  Labs today:  CBC with diff, CMP, CA27.29. 4.  Continue Femara. 5.  RTC in 6 months for MD assessment and labs (CBC with diff, CMP, CA27.29).   Lequita Asal, MD  07/01/2016, 12:54 PM

## 2016-07-01 ENCOUNTER — Inpatient Hospital Stay (HOSPITAL_BASED_OUTPATIENT_CLINIC_OR_DEPARTMENT_OTHER): Payer: Medicare Other | Admitting: Hematology and Oncology

## 2016-07-01 ENCOUNTER — Inpatient Hospital Stay: Payer: Medicare Other | Attending: Hematology and Oncology

## 2016-07-01 DIAGNOSIS — Z79811 Long term (current) use of aromatase inhibitors: Secondary | ICD-10-CM

## 2016-07-01 DIAGNOSIS — Z7982 Long term (current) use of aspirin: Secondary | ICD-10-CM | POA: Diagnosis not present

## 2016-07-01 DIAGNOSIS — M810 Age-related osteoporosis without current pathological fracture: Secondary | ICD-10-CM | POA: Insufficient documentation

## 2016-07-01 DIAGNOSIS — C50411 Malignant neoplasm of upper-outer quadrant of right female breast: Secondary | ICD-10-CM

## 2016-07-01 DIAGNOSIS — Z9011 Acquired absence of right breast and nipple: Secondary | ICD-10-CM | POA: Diagnosis not present

## 2016-07-01 DIAGNOSIS — Z170 Estrogen receptor positive status [ER+]: Secondary | ICD-10-CM | POA: Insufficient documentation

## 2016-07-01 DIAGNOSIS — Z79899 Other long term (current) drug therapy: Secondary | ICD-10-CM | POA: Diagnosis not present

## 2016-07-01 DIAGNOSIS — C50911 Malignant neoplasm of unspecified site of right female breast: Secondary | ICD-10-CM

## 2016-07-01 DIAGNOSIS — R5383 Other fatigue: Secondary | ICD-10-CM | POA: Insufficient documentation

## 2016-07-01 DIAGNOSIS — E7800 Pure hypercholesterolemia, unspecified: Secondary | ICD-10-CM

## 2016-07-01 DIAGNOSIS — Z853 Personal history of malignant neoplasm of breast: Secondary | ICD-10-CM

## 2016-07-01 LAB — COMPREHENSIVE METABOLIC PANEL
ALT: 10 U/L — ABNORMAL LOW (ref 14–54)
AST: 16 U/L (ref 15–41)
Albumin: 4.1 g/dL (ref 3.5–5.0)
Alkaline Phosphatase: 39 U/L (ref 38–126)
Anion gap: 6 (ref 5–15)
BUN: 10 mg/dL (ref 6–20)
CO2: 24 mmol/L (ref 22–32)
Calcium: 8.9 mg/dL (ref 8.9–10.3)
Chloride: 108 mmol/L (ref 101–111)
Creatinine, Ser: 0.51 mg/dL (ref 0.44–1.00)
GFR calc Af Amer: >60 mL/min (ref >60)
GFR calc non Af Amer: >60 mL/min (ref >60)
Glucose, Bld: 111 mg/dL — ABNORMAL HIGH (ref 65–99)
Potassium: 4.6 mmol/L (ref 3.5–5.1)
Sodium: 138 mmol/L (ref 135–145)
Total Bilirubin: 0.4 mg/dL (ref 0.3–1.2)
Total Protein: 6.9 g/dL (ref 6.5–8.1)

## 2016-07-01 LAB — CBC WITH DIFFERENTIAL/PLATELET
Basophils Absolute: 0 10*3/uL (ref 0–0.1)
Basophils Relative: 1 %
Eosinophils Absolute: 0 10*3/uL (ref 0–0.7)
Eosinophils Relative: 1 %
HCT: 39.7 % (ref 35.0–47.0)
Hemoglobin: 13.7 g/dL (ref 12.0–16.0)
Lymphocytes Relative: 21 %
Lymphs Abs: 1.1 10*3/uL (ref 1.0–3.6)
MCH: 31.3 pg (ref 26.0–34.0)
MCHC: 34.5 g/dL (ref 32.0–36.0)
MCV: 90.8 fL (ref 80.0–100.0)
Monocytes Absolute: 0.8 10*3/uL (ref 0.2–0.9)
Monocytes Relative: 15 %
Neutro Abs: 3.2 10*3/uL (ref 1.4–6.5)
Neutrophils Relative %: 62 %
Platelets: 206 10*3/uL (ref 150–440)
RBC: 4.37 MIL/uL (ref 3.80–5.20)
RDW: 13.8 % (ref 11.5–14.5)
WBC: 5.1 10*3/uL (ref 3.6–11.0)

## 2016-07-01 NOTE — Progress Notes (Signed)
Patient here today for follow up regarding breast cancer . 

## 2016-07-02 ENCOUNTER — Other Ambulatory Visit: Payer: Self-pay | Admitting: *Deleted

## 2016-07-02 DIAGNOSIS — C50911 Malignant neoplasm of unspecified site of right female breast: Secondary | ICD-10-CM

## 2016-07-02 LAB — CANCER ANTIGEN 27.29: CA 27.29: 5.6 U/mL (ref 0.0–38.6)

## 2016-07-27 ENCOUNTER — Encounter: Payer: Self-pay | Admitting: Hematology and Oncology

## 2016-08-04 ENCOUNTER — Other Ambulatory Visit: Payer: Self-pay | Admitting: *Deleted

## 2016-08-04 MED ORDER — LETROZOLE 2.5 MG PO TABS: 2.5000 mg | ORAL_TABLET | Freq: Every day | ORAL | 2 refills | Status: DC

## 2016-12-29 ENCOUNTER — Other Ambulatory Visit: Payer: Medicare Other

## 2016-12-29 ENCOUNTER — Ambulatory Visit: Payer: Medicare Other | Admitting: Hematology and Oncology

## 2016-12-30 ENCOUNTER — Inpatient Hospital Stay: Payer: Medicare Other | Admitting: Hematology and Oncology

## 2016-12-30 ENCOUNTER — Inpatient Hospital Stay: Payer: Medicare Other

## 2017-01-06 DIAGNOSIS — H401131 Primary open-angle glaucoma, bilateral, mild stage: Secondary | ICD-10-CM | POA: Diagnosis not present

## 2017-01-07 ENCOUNTER — Other Ambulatory Visit: Payer: Self-pay | Admitting: Hematology and Oncology

## 2017-01-07 DIAGNOSIS — H1013 Acute atopic conjunctivitis, bilateral: Secondary | ICD-10-CM | POA: Diagnosis not present

## 2017-01-13 DIAGNOSIS — H401131 Primary open-angle glaucoma, bilateral, mild stage: Secondary | ICD-10-CM | POA: Diagnosis not present

## 2017-01-19 ENCOUNTER — Inpatient Hospital Stay: Payer: Medicare Other | Attending: Hematology and Oncology

## 2017-01-19 ENCOUNTER — Encounter: Payer: Self-pay | Admitting: Hematology and Oncology

## 2017-01-19 ENCOUNTER — Inpatient Hospital Stay (HOSPITAL_BASED_OUTPATIENT_CLINIC_OR_DEPARTMENT_OTHER): Payer: Medicare Other | Admitting: Hematology and Oncology

## 2017-01-19 VITALS — BP 117/77 | HR 78 | Temp 97.9°F | Resp 18 | Wt 126.2 lb

## 2017-01-19 DIAGNOSIS — Z79811 Long term (current) use of aromatase inhibitors: Secondary | ICD-10-CM | POA: Diagnosis not present

## 2017-01-19 DIAGNOSIS — Z9011 Acquired absence of right breast and nipple: Secondary | ICD-10-CM | POA: Insufficient documentation

## 2017-01-19 DIAGNOSIS — Z7982 Long term (current) use of aspirin: Secondary | ICD-10-CM | POA: Insufficient documentation

## 2017-01-19 DIAGNOSIS — F1010 Alcohol abuse, uncomplicated: Secondary | ICD-10-CM | POA: Diagnosis not present

## 2017-01-19 DIAGNOSIS — C50411 Malignant neoplasm of upper-outer quadrant of right female breast: Secondary | ICD-10-CM

## 2017-01-19 DIAGNOSIS — Z170 Estrogen receptor positive status [ER+]: Secondary | ICD-10-CM | POA: Insufficient documentation

## 2017-01-19 DIAGNOSIS — C50911 Malignant neoplasm of unspecified site of right female breast: Secondary | ICD-10-CM

## 2017-01-19 DIAGNOSIS — Z8781 Personal history of (healed) traumatic fracture: Secondary | ICD-10-CM | POA: Insufficient documentation

## 2017-01-19 DIAGNOSIS — M810 Age-related osteoporosis without current pathological fracture: Secondary | ICD-10-CM

## 2017-01-19 DIAGNOSIS — Z79899 Other long term (current) drug therapy: Secondary | ICD-10-CM

## 2017-01-19 LAB — CBC WITH DIFFERENTIAL/PLATELET
Basophils Absolute: 0.1 10*3/uL (ref 0–0.1)
Basophils Relative: 1 %
Eosinophils Absolute: 0.1 10*3/uL (ref 0–0.7)
Eosinophils Relative: 1 %
HCT: 38.7 % (ref 35.0–47.0)
Hemoglobin: 13.4 g/dL (ref 12.0–16.0)
Lymphocytes Relative: 17 %
Lymphs Abs: 1.3 10*3/uL (ref 1.0–3.6)
MCH: 30.7 pg (ref 26.0–34.0)
MCHC: 34.7 g/dL (ref 32.0–36.0)
MCV: 88.5 fL (ref 80.0–100.0)
Monocytes Absolute: 0.6 10*3/uL (ref 0.2–0.9)
Monocytes Relative: 8 %
Neutro Abs: 5.6 10*3/uL (ref 1.4–6.5)
Neutrophils Relative %: 73 %
Platelets: 261 10*3/uL (ref 150–440)
RBC: 4.37 MIL/uL (ref 3.80–5.20)
RDW: 13.2 % (ref 11.5–14.5)
WBC: 7.6 10*3/uL (ref 3.6–11.0)

## 2017-01-19 LAB — COMPREHENSIVE METABOLIC PANEL
ALT: 14 U/L (ref 14–54)
AST: 18 U/L (ref 15–41)
Albumin: 4.2 g/dL (ref 3.5–5.0)
Alkaline Phosphatase: 48 U/L (ref 38–126)
Anion gap: 5 (ref 5–15)
BUN: 13 mg/dL (ref 6–20)
CO2: 26 mmol/L (ref 22–32)
Calcium: 9.3 mg/dL (ref 8.9–10.3)
Chloride: 104 mmol/L (ref 101–111)
Creatinine, Ser: 0.61 mg/dL (ref 0.44–1.00)
GFR calc Af Amer: >60 mL/min (ref >60)
GFR calc non Af Amer: >60 mL/min (ref >60)
Glucose, Bld: 110 mg/dL — ABNORMAL HIGH (ref 65–99)
Potassium: 4.3 mmol/L (ref 3.5–5.1)
Sodium: 135 mmol/L (ref 135–145)
Total Bilirubin: 0.5 mg/dL (ref 0.3–1.2)
Total Protein: 7.2 g/dL (ref 6.5–8.1)

## 2017-01-19 NOTE — Progress Notes (Signed)
Patient here today for follow up regarding breast cancer . 

## 2017-01-19 NOTE — Progress Notes (Signed)
[facility] day:  01/19/2017  Chief Complaint: Rebecca Faulkner is a 75 y.o. female with stage IA right breast cancer who is seen for  6 month assessment.  HPI:   The patient was last seen in the medical oncology clinic on 07/01/2016.  At that time, she was seen for initial assessment by me.  Symptomatically, she felt good.  Exam was unremarkable.  Labs included a normal CBC, CMP, and CA27.29.  During the interim, she has done well. He states that she always worries.  Every now and then, she has discomfort where her bra is and under her arm.   Past Medical History:  Diagnosis Date  . Breast cancer ([place]) 2013   right breast  . Heart murmur 1990  . Hypercholesteremia   . Lump or mass in breast   . Malignant neoplasm of upper-outer quadrant of female breast ([place]) 2013   R-breast T1, NO, MO, ER/PR pos., Her 2 neg.  . Osteoporosis   . Ulcer ([place]) 1974    Past Surgical History:  Procedure Laterality Date  . BREAST SURGERY Right 2013  . CATARACT EXTRACTION W/PHACO Right 04/23/2016   Procedure: CATARACT EXTRACTION PHACO AND INTRAOCULAR LENS PLACEMENT ([place]) RIGHT EYE;  Surgeon: Leandrew Koyanagi, MD;  Location: [facility];  Service: Ophthalmology;  Laterality: Right;  . CATARACT EXTRACTION W/PHACO Left 06/04/2016   Procedure: CATARACT EXTRACTION PHACO AND INTRAOCULAR LENS PLACEMENT ([facility]);  Surgeon: Leandrew Koyanagi, MD;  Location: [facility];  Service: Ophthalmology;  Laterality: Left;  . [place]  . FRACTURE SURGERY Left 2011   arm, plate and 12 screws  . LEG SURGERY Right 01/02/2015  . MASTECTOMY Right 2013  . Right inguinal hernia repair  2013  . VEIN SURGERY Left 2015    Family History  Problem Relation Age of Onset  . Breast cancer Neg Hx     Social History:  reports that she has never smoked. She has never used smokeless tobacco. She reports that she drinks about 25.2 oz of alcohol per week . She  reports that she does not use drugs.  She drinks 4-5 beers/day.  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  She lives alone in [place].  The patient is alone today.  Allergies:  Allergies  Allergen Reactions  . Codeine Nausea Only    Current Medications: Current Outpatient Prescriptions  Medication Sig Dispense Refill  . alendronate (FOSAMAX) 70 MG tablet Take 1 tablet (70 mg total) by mouth once a week. 12 tablet 2  . latanoprost (XALATAN) 0.005 % ophthalmic solution Place 1 drop into both eyes at bedtime. Reported on 04/17/2016  2  . letrozole (FEMARA) 2.5 MG tablet TAKE ONE TABLET BY MOUTH ONCE DAILY 30 tablet 0  . lidocaine (LIDODERM) 5 % Place 1 patch onto the skin daily. Reported on 04/17/2016    . prednisoLONE acetate (PRED FORTE) 1 % ophthalmic suspension   0  . timolol (BETIMOL) 0.5 % ophthalmic solution Place 1 drop into both eyes daily. Reported on 04/17/2016    . aspirin EC 81 MG tablet Take 81 mg by mouth daily.    . brimonidine (ALPHAGAN) 0.2 % ophthalmic solution 2 (two) times daily.    . brompheniramine-pseudoephedrine-DM 30-2-10 MG/5ML syrup Take 5 mLs by mouth 4 (four) times daily as needed. (Patient not taking: Reported on 01/19/2017) 120 mL 0  . calcium carbonate (OS-CAL) 600 MG TABS Take 600 mg by  mouth 2 (two) times daily with a meal.    . doxycycline (MONODOX) 100 MG capsule Take 1 capsule (100 mg total) by mouth 2 (two) times daily. (Patient not taking: Reported on 01/19/2017) 20 capsule 0   No current facility-administered medications for this visit.     Review of Systems:  GENERAL:  Feels great.  No fevers or sweats.  Weight stable. PERFORMANCE STATUS (ECOG): 0 HEENT:  No visual changes, runny nose, sore throat, mouth sores or tenderness. Lungs: No shortness of breath or cough.  No hemoptysis. Cardiac:  No chest pain, palpitations, orthopnea, or PND. GI:  No nausea, vomiting, diarrhea, constipation, melena or hematochezia.  Never had a  colonscopy. GU:  No urgency, frequency, dysuria, or hematuria. Musculoskeletal:  No back pain.  No joint pain.  No muscle tenderness. Extremities:  No pain or swelling. Skin:  No rashes or skin changes. Neuro:  No headache, numbness or weakness, balance or coordination issues. Endocrine:  No diabetes, thyroid issues, hot flashes or night sweats. Psych:  No mood changes, depression or anxiety.  Worries. Pain:  No focal pain. Review of systems:  All other systems reviewed and found to be negative.  Physical Exam: Blood pressure 117/77, pulse 78, temperature 97.9 F (36.6 C), temperature source Tympanic, resp. rate 18, weight 126 lb 4 oz (57.3 kg). GENERAL:  Well developed, well nourished, woman sitting comfortably in the exam room in no acute distress.  She has a cane at her side. MENTAL STATUS:  Alert and oriented to person, place and time. HEAD:  Curly gray hair.  Normocephalic, atraumatic, face symmetric, no Cushingoid features. EYES:  Brown eyes.  Pupils equal round and reactive to light and accomodation.  No conjunctivitis or scleral icterus. ENT:  Oropharynx clear without lesion.  Tongue normal. Mucous membranes moist.  RESPIRATORY:  Clear to auscultation without rales, wheezes or rhonchi. CARDIOVASCULAR:  Regular rate and rhythm without murmur, rub or gallop. BREAST:  Right mastectomy without erythema or nodularity.  Left breast with medial fibrocystic changes (no change).  No discrete masses, skin changes or nipple discharge.  ABDOMEN:  Soft, non-tender, with active bowel sounds, and no hepatosplenomegaly.  No masses. SKIN:  No rashes, ulcers or lesions. EXTREMITIES: No edema, no skin discoloration or tenderness.  No palpable cords. LYMPH NODES: No palpable cervical, supraclavicular, axillary or inguinal adenopathy  NEUROLOGICAL: Unremarkable. PSYCH:  Appropriate.   Appointment on 01/19/2017  Component Date Value Ref Range Status  . WBC 01/19/2017 7.6  3.6 - 11.0 K/uL Final  .  RBC 01/19/2017 4.37  3.80 - 5.20 MIL/uL Final  . Hemoglobin 01/19/2017 13.4  12.0 - 16.0 g/dL Final  . HCT 01/19/2017 38.7  35.0 - 47.0 % Final  . MCV 01/19/2017 88.5  80.0 - 100.0 fL Final  . MCH 01/19/2017 30.7  26.0 - 34.0 pg Final  . MCHC 01/19/2017 34.7  32.0 - 36.0 g/dL Final  . RDW 01/19/2017 13.2  11.5 - 14.5 % Final  . Platelets 01/19/2017 261  150 - 440 K/uL Final  . Neutrophils Relative % 01/19/2017 73  % Final  . Neutro Abs 01/19/2017 5.6  1.4 - 6.5 K/uL Final  . Lymphocytes Relative 01/19/2017 17  % Final  . Lymphs Abs 01/19/2017 1.3  1.0 - 3.6 K/uL Final  . Monocytes Relative 01/19/2017 8  % Final  . Monocytes Absolute 01/19/2017 0.6  0.2 - 0.9 K/uL Final  . Eosinophils Relative 01/19/2017 1  % Final  . Eosinophils Absolute 01/19/2017 0.1  0 - 0.7 K/uL Final  . Basophils Relative 01/19/2017 1  % Final  . Basophils Absolute 01/19/2017 0.1  0 - 0.1 K/uL Final  . Sodium 01/19/2017 135  135 - 145 mmol/L Final  . Potassium 01/19/2017 4.3  3.5 - 5.1 mmol/L Final  . Chloride 01/19/2017 104  101 - 111 mmol/L Final  . CO2 01/19/2017 26  22 - 32 mmol/L Final  . Glucose, Bld 01/19/2017 110* 65 - 99 mg/dL Final  . BUN 01/19/2017 13  6 - 20 mg/dL Final  . Creatinine, Ser 01/19/2017 0.61  0.44 - 1.00 mg/dL Final  . Calcium 01/19/2017 9.3  8.9 - 10.3 mg/dL Final  . Total Protein 01/19/2017 7.2  6.5 - 8.1 g/dL Final  . Albumin 01/19/2017 4.2  3.5 - 5.0 g/dL Final  . AST 01/19/2017 18  15 - 41 U/L Final  . ALT 01/19/2017 14  14 - 54 U/L Final  . Alkaline Phosphatase 01/19/2017 48  38 - 126 U/L Final  . Total Bilirubin 01/19/2017 0.5  0.3 - 1.2 mg/dL Final  . GFR calc non Af Amer 01/19/2017 >60  >60 mL/min Final  . GFR calc Af Amer 01/19/2017 >60  >60 mL/min Final   Comment: (NOTE) The eGFR has been calculated using the CKD EPI equation. This calculation has not been validated in all clinical situations. eGFR's persistently <60 mL/min signify possible Chronic Kidney Disease.   Rebecca Faulkner gap 01/19/2017 5  5 - 15 Final    Assessment:  ACHAIA GARLOCK is a 76 y.o. female with stage IA right breast cancer s/p right modified radical mastectomy with sentinel lymph node biopsy on 05/27/2012.  Pathology revealed a 1.8 cm grade I invasive carcinoma.  There was angiolymphatic invasion.  There was low grade DCIS.  One sentinel node and 5 non-sentinel nodes were removed.  Zero of 6 nodes were positive.  Tumor was ER + (90%).  PR + (90%).  HER-2 receptor - by FISH.  Pathologic stage was T1cN0M0 breast cancer.  Oncotype DX score was 10 which translated into a 7% chance of recurrent disease in 10 years of tamoxifen.  She began Femara in 07/2012.  Left mammogram on 05/12/2016 revealed no evidence of malignancy.  CA27.29 has been followed:  14.3 on 02/02/2013, 13.9 on 08/11/2013, 10.1 on 02/20/2014, 14.3 on 08/22/2014, and 5.6 on 07/01/2016.  She was admitted with a right femur fracture s/p fall in 12/2014.  She was on [place], calcium and vitamin D.  She has been taking Fosamax x 2 years.  Bone density on 08/08/2014 revealed osteoporosis with a T score of -2.9 in the left femur, -1.6 in the left forearm radius.  Bone density on 05/12/2016 revealed osteoporosis with a T score of -2.7 in the left femur, -1.4 in the left forearm radius.  Symptomatically, she feels good.  Exam is unremarkable.  Plan: 1.  Labs today: CBC with diff, CMP, CA27.29. 2.  Schedule left mammogram 05/12/2017. 3.  Discuss BCI testing (5 versus 10 years of adjuvant hormonal therapy).  Patient interested. 4.  Mallie Snooks, RN to talk to patient about BCI testing and cost to her 5.  Continue Femara. 6.  RTC in 6 months for MD assessment and labs (CBC with diff, CMP, CA27.29).   Lequita Asal, MD  01/19/2017, 12:32 PM

## 2017-01-20 LAB — CANCER ANTIGEN 27.29: CA 27.29: 14 U/mL (ref 0.0–38.6)

## 2017-02-12 ENCOUNTER — Other Ambulatory Visit: Payer: Self-pay | Admitting: *Deleted

## 2017-02-12 MED ORDER — LETROZOLE 2.5 MG PO TABS: 2.5000 mg | ORAL_TABLET | Freq: Every day | ORAL | 1 refills | Status: DC

## 2017-02-12 MED ORDER — ALENDRONATE SODIUM 70 MG PO TABS: 70.0000 mg | ORAL_TABLET | ORAL | 2 refills | Status: DC

## 2017-02-16 IMAGING — MG MM DIGITAL SCREENING UNILAT*L* W/ TOMO W/ CAD
7 series · 9 of 15 positions shown · non-contrast
Comparison: Previous exam(s).

CLINICAL DATA: Screening.

EXAM:
2D DIGITAL SCREENING UNILATERAL LEFT MAMMOGRAM WITH CAD AND ADJUNCT
TOMO

[L XCCL]
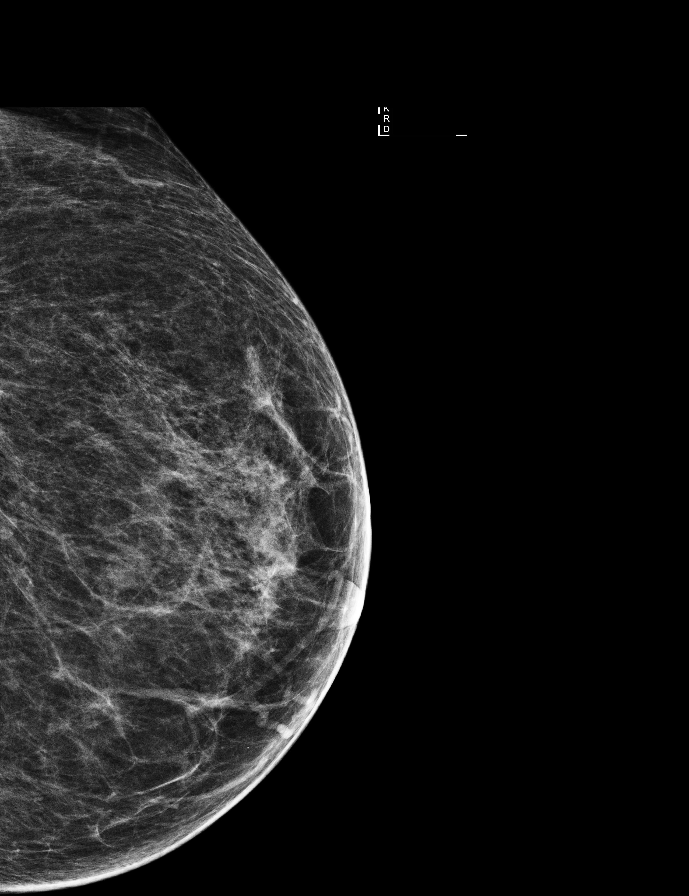

[L CC]
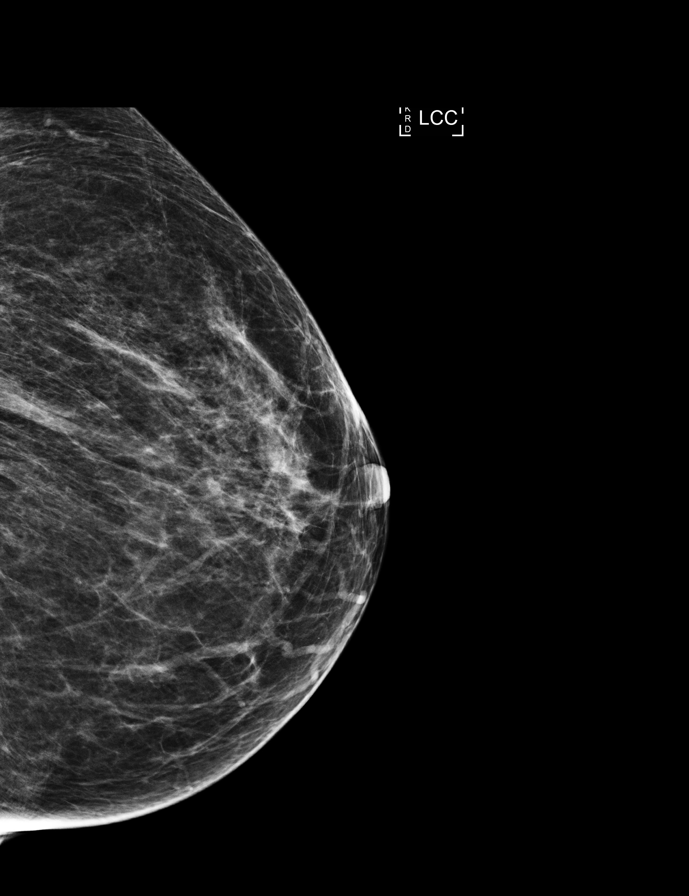

[L MLO synth-2D]
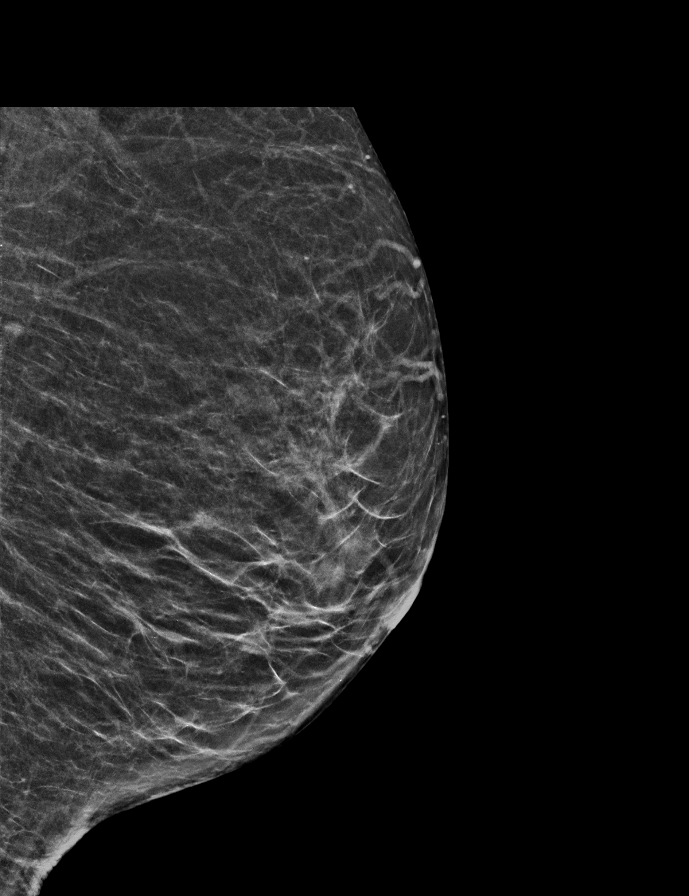

[L CC synth-2D]
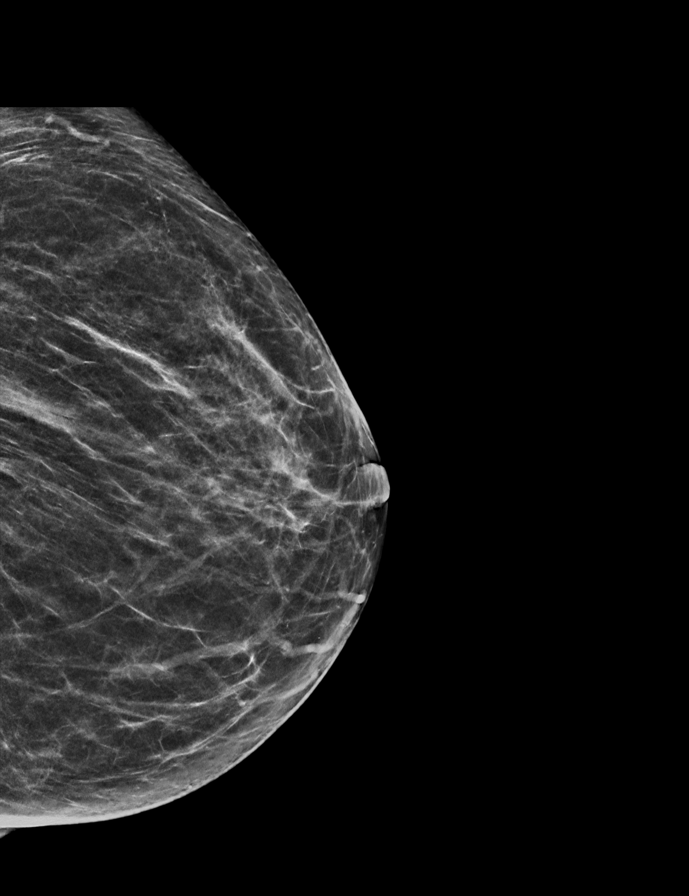

[L MLO]
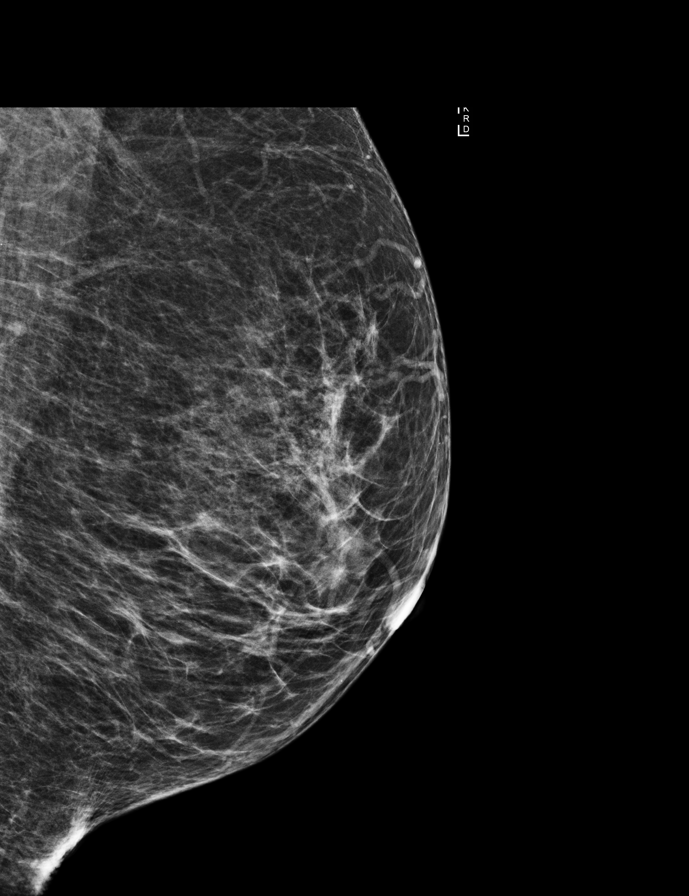

[L CC tomo · 3 of 58 frames shown]
[frame 19/58]
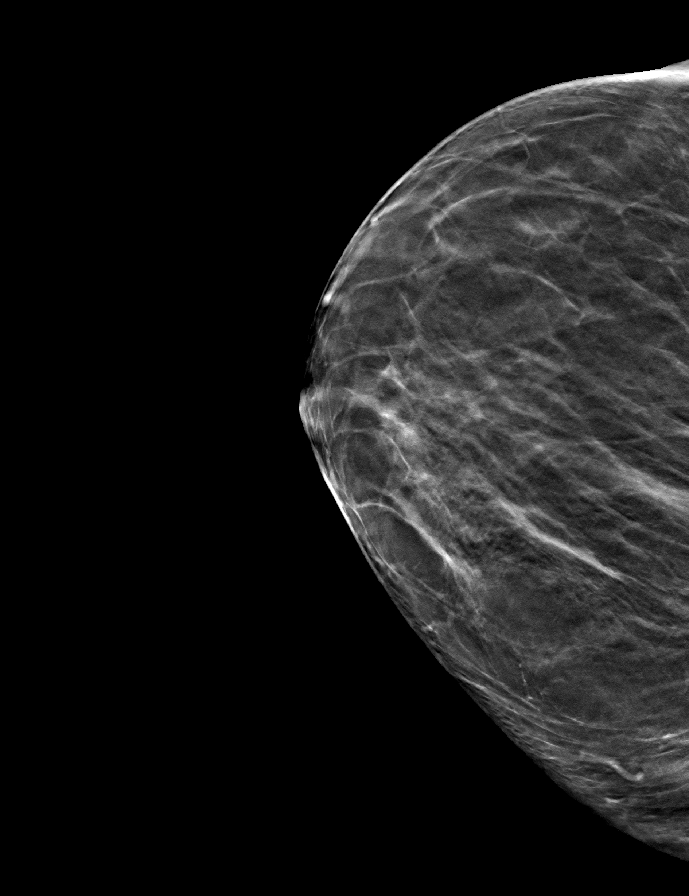
[frame 29/58]
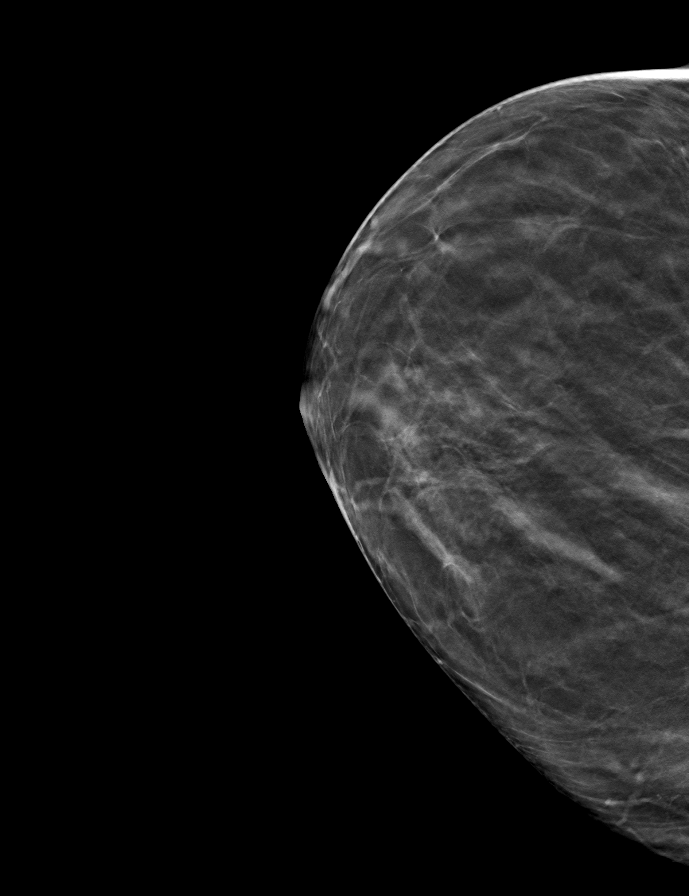
[frame 40/58]
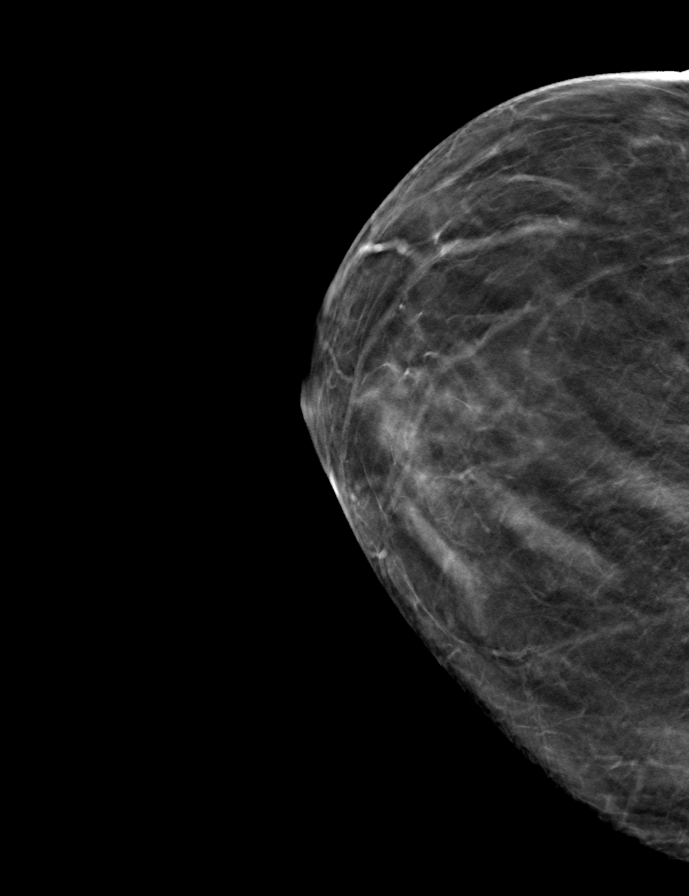

[L MLO tomo · tomo slice 26/51.0]
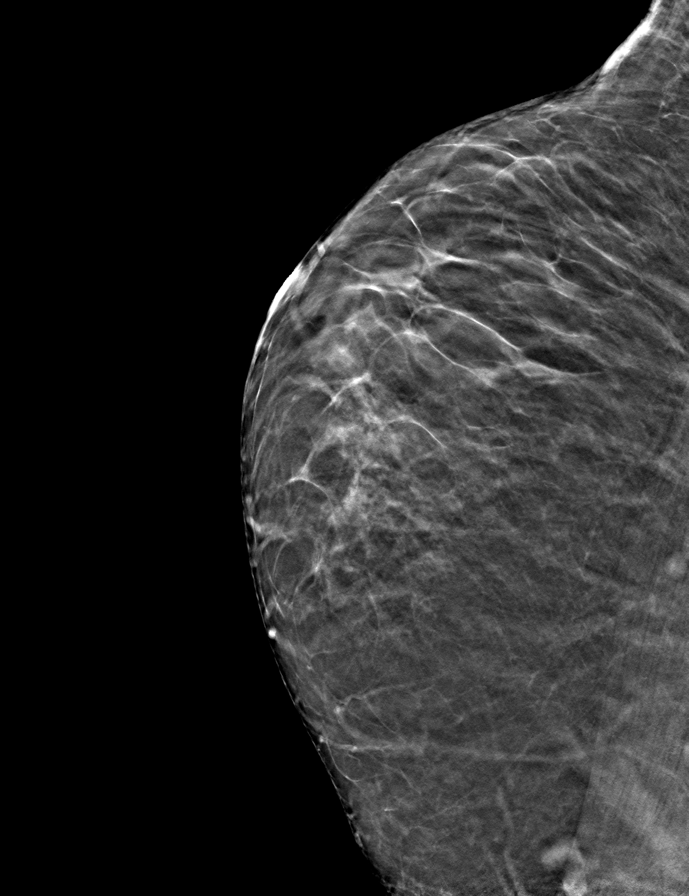

[9 of 15 positions shown; findings below may reference images not displayed]

ACR Breast Density Category b: There are scattered areas of
fibroglandular density.
FINDINGS: The patient has had a right mastectomy. There are no findings
suspicious for malignancy.

Images were processed with CAD.
IMPRESSION: No mammographic evidence of malignancy. A result letter of this
screening mammogram will be mailed directly to the patient.

RECOMMENDATION:
Screening mammogram in one year.  (Code:Y6-S-3T6)

BI-RADS CATEGORY  1: Negative.

## 2017-04-19 DIAGNOSIS — S50862A Insect bite (nonvenomous) of left forearm, initial encounter: Secondary | ICD-10-CM | POA: Diagnosis not present

## 2017-05-13 ENCOUNTER — Ambulatory Visit
Admission: RE | Admit: 2017-05-13 | Discharge: 2017-05-13 | Disposition: A | Discharge status: Home / Self Care | Payer: Medicare Other | Source: Ambulatory Visit | Attending: Hematology and Oncology | Admitting: Hematology and Oncology

## 2017-05-13 DIAGNOSIS — Z1231 Encounter for screening mammogram for malignant neoplasm of breast: Secondary | ICD-10-CM | POA: Insufficient documentation

## 2017-05-13 DIAGNOSIS — C50911 Malignant neoplasm of unspecified site of right female breast: Secondary | ICD-10-CM

## 2017-05-13 DIAGNOSIS — Z170 Estrogen receptor positive status [ER+]: Secondary | ICD-10-CM

## 2017-05-19 ENCOUNTER — Ambulatory Visit: Payer: Self-pay | Admitting: Family Medicine

## 2017-05-25 ENCOUNTER — Ambulatory Visit: Payer: Medicare Other | Admitting: General Surgery

## 2017-05-26 ENCOUNTER — Encounter: Payer: Self-pay | Admitting: Family Medicine

## 2017-05-26 ENCOUNTER — Ambulatory Visit (INDEPENDENT_AMBULATORY_CARE_PROVIDER_SITE_OTHER): Payer: Medicare Other | Admitting: Family Medicine

## 2017-05-26 VITALS — BP 126/82 | HR 100 | Temp 97.8°F | Resp 16 | Ht 60.0 in | Wt 141.5 lb

## 2017-05-26 DIAGNOSIS — R739 Hyperglycemia, unspecified: Secondary | ICD-10-CM

## 2017-05-26 DIAGNOSIS — F411 Generalized anxiety disorder: Secondary | ICD-10-CM | POA: Diagnosis not present

## 2017-05-26 DIAGNOSIS — F330 Major depressive disorder, recurrent, mild: Secondary | ICD-10-CM | POA: Diagnosis not present

## 2017-05-26 DIAGNOSIS — M810 Age-related osteoporosis without current pathological fracture: Secondary | ICD-10-CM

## 2017-05-26 DIAGNOSIS — F1021 Alcohol dependence, in remission: Secondary | ICD-10-CM

## 2017-05-26 DIAGNOSIS — R5383 Other fatigue: Secondary | ICD-10-CM | POA: Diagnosis not present

## 2017-05-26 DIAGNOSIS — Z170 Estrogen receptor positive status [ER+]: Secondary | ICD-10-CM | POA: Diagnosis not present

## 2017-05-26 DIAGNOSIS — C50911 Malignant neoplasm of unspecified site of right female breast: Secondary | ICD-10-CM

## 2017-05-26 DIAGNOSIS — H409 Unspecified glaucoma: Secondary | ICD-10-CM | POA: Insufficient documentation

## 2017-05-26 DIAGNOSIS — Z23 Encounter for immunization: Secondary | ICD-10-CM

## 2017-05-26 DIAGNOSIS — Z8781 Personal history of (healed) traumatic fracture: Secondary | ICD-10-CM | POA: Diagnosis not present

## 2017-05-26 LAB — CBC WITH DIFFERENTIAL/PLATELET
BASOS ABS: 0 {cells}/uL (ref 0–200)
Basophils Relative: 0 %
EOS PCT: 2 %
Eosinophils Absolute: 148 cells/uL (ref 15–500)
HCT: 43 % (ref 35.0–45.0)
Hemoglobin: 14.4 g/dL (ref 11.7–15.5)
LYMPHS ABS: 1480 {cells}/uL (ref 850–3900)
Lymphocytes Relative: 20 %
MCH: 30.1 pg (ref 27.0–33.0)
MCHC: 33.5 g/dL (ref 32.0–36.0)
MCV: 89.8 fL (ref 80.0–100.0)
MONO ABS: 666 {cells}/uL (ref 200–950)
MPV: 9.9 fL (ref 7.5–12.5)
Monocytes Relative: 9 %
NEUTROS ABS: 5106 {cells}/uL (ref 1500–7800)
NEUTROS PCT: 69 %
PLATELETS: 264 10*3/uL (ref 140–400)
RBC: 4.79 MIL/uL (ref 3.80–5.10)
RDW: 13.7 % (ref 11.0–15.0)
WBC: 7.4 10*3/uL (ref 3.8–10.8)

## 2017-05-26 MED ORDER — CITALOPRAM HYDROBROMIDE 20 MG PO TABS: 20.0000 mg | ORAL_TABLET | Freq: Every day | ORAL | 1 refills | Status: DC

## 2017-05-26 NOTE — Progress Notes (Signed)
Name: Rebecca Faulkner   MRN: [id number]    DOB: [birth year]   Date:05/26/2017       Progress Note  Subjective  Chief Complaint  Chief Complaint  Patient presents with  . Fatigue    HPI  Other fatigue: she has been feeling very tired lately, no energy, based on screening she is depressed and has anxiety. She quit drinking almost 7 months ago, she used to drink 7-12 beers every night. Since she quit drinking she has been more snappy, angry, and still craves the idea of drinking. She states that she quit for her health.   Breast Cancer, 5 years out cancer treatment, still sees oncologist and surgeon, up to date with mammogram. She is Femara  Osteoporosis: still taking Fosamax, no side effects, taking calcium and vitamin D also. Due for labs  Hyperglycemia and family history of diabetes: she is eating cheese care for dinner every night, but no polyuria, polydipsia or polyphagia.    Patient Active Problem List   Diagnosis Date Noted  . Glaucoma 05/26/2017  . History of alcoholism ([place]) 05/26/2017  . History of hip fracture 05/10/2015  . Breast cancer, right ([place]) 05/27/2012  . Estrogen receptor positive status (ER+) 05/14/2012    Past Surgical History:  Procedure Laterality Date  . BREAST SURGERY Right 2013  . CATARACT EXTRACTION W/PHACO Right 04/23/2016   Procedure: CATARACT EXTRACTION PHACO AND INTRAOCULAR LENS PLACEMENT ([place]) RIGHT EYE;  Surgeon: Leandrew Koyanagi, MD;  Location: [facility];  Service: Ophthalmology;  Laterality: Right;  . CATARACT EXTRACTION W/PHACO Left 06/04/2016   Procedure: CATARACT EXTRACTION PHACO AND INTRAOCULAR LENS PLACEMENT ([facility]);  Surgeon: Leandrew Koyanagi, MD;  Location: [facility];  Service: Ophthalmology;  Laterality: Left;  . [place]  . FRACTURE SURGERY Left 2011   arm, plate and 12 screws  . LEG SURGERY Right 01/02/2015  . MASTECTOMY Right 2013  . Right inguinal hernia repair  2013  . VEIN SURGERY Left 2015     Family History  Problem Relation Age of Onset  . Dementia Mother   . Stroke Father   . Diabetes Daughter   . Hypertension Daughter   . Breast cancer Neg Hx     Social History   Social History  . Marital status: Divorced    Spouse name: N/A  . Number of children: N/A  . Years of education: N/A   Occupational History  . Not on file.   Social History Main Topics  . Smoking status: Never Smoker  . Smokeless tobacco: Never Used  . Alcohol use No     Comment: Quit drinking  11/04/2016  . Drug use: No  . Sexual activity: Not Currently   Other Topics Concern  . Not on file   Social History Narrative   Works full time at [employer]. She is the Freight forwarder and to go window   She has one son  - that lives in [place]   Two dogs and one cat.      Current Outpatient Prescriptions:  .  alendronate (FOSAMAX) 70 MG tablet, Take 1 tablet (70 mg total) by mouth once a week., Disp: 12 tablet, Rfl: 2 .  aspirin EC 81 MG tablet, Take 81 mg by mouth daily., Disp: , Rfl:  .  calcium carbonate (OS-CAL) 600 MG TABS, Take 600 mg by mouth 2 (two) times daily with a meal., Disp: , Rfl:  .  letrozole (FEMARA) 2.5 MG tablet, Take 1 tablet (2.5 mg total) by  mouth daily., Disp: 90 tablet, Rfl: 1 .  citalopram (CELEXA) 20 MG tablet, Take 1 tablet (20 mg total) by mouth daily., Disp: 30 tablet, Rfl: 1  Allergies  Allergen Reactions  . Codeine Nausea Only     ROS  Constitutional: Negative for fever or weight change.  Respiratory: Negative for cough and shortness of breath.   Cardiovascular: Negative for chest pain or palpitations.  Gastrointestinal: Negative for abdominal pain, no bowel changes.  Musculoskeletal: Negative for gait problem or joint swelling.  Skin: Negative for rash.  Neurological: Negative for dizziness or headache.  No other specific complaints in a complete review of systems (except as listed in HPI above).   Objective  Vitals:   05/26/17 1359  BP: 126/82  Pulse:  100  Resp: 16  Temp: 97.8 F (36.6 C)  TempSrc: Oral  SpO2: 94%  Weight: 141 lb 8 oz (64.2 kg)  Height: 5' (1.524 m)    Body mass index is 27.63 kg/m.  Physical Exam  Constitutional: Patient appears well-developed and well-nourished.  No distress.  HEENT: head atraumatic, normocephalic, pupils equal and reactive to light,  neck supple, throat within normal limits Cardiovascular: Normal rate, regular rhythm and normal heart sounds.  No murmur heard. No BLE edema. Pulmonary/Chest: Effort normal and breath sounds normal. No respiratory distress. Abdominal: Soft.  There is no tenderness. Psychiatric: Patient has a normal mood and affect. behavior is normal. Judgment and thought content normal. Muscular Skeletal: slow gait, antalgic, using cane  PHQ2/9: Depression screen [facility] 2/9 05/26/2017 05/26/2017  Decreased Interest 1 0  Down, Depressed, Hopeless 3 1  PHQ - 2 Score 4 1  Altered sleeping 0 -  Tired, decreased energy 3 -  Change in appetite 0 -  Feeling bad or failure about yourself  3 -  Trouble concentrating 0 -  Moving slowly or fidgety/restless 0 -  Suicidal thoughts 0 -  PHQ-9 Score 10 -     Fall Risk: Fall Risk  05/26/2017  Falls in the past year? No    Functional Status Survey: Is the patient deaf or have difficulty hearing?: No Does the patient have difficulty seeing, even when wearing glasses/contacts?: No Does the patient have difficulty concentrating, remembering, or making decisions?: No Does the patient have difficulty walking or climbing stairs?: Yes Does the patient have difficulty dressing or bathing?: No Does the patient have difficulty doing errands alone such as visiting a doctor's office or shopping?: No  GAD 7 : Generalized Anxiety Score 05/26/2017  Nervous, Anxious, on Edge 3  Control/stop worrying 3  Worry too much - different things 3  Trouble relaxing 0  Restless 0  Easily annoyed or irritable 3  Afraid - awful might happen 0  Total GAD 7  Score 12      Assessment & Plan  1. Malignant neoplasm of right breast in female, estrogen receptor positive, unspecified site of breast ([place])  Continue follow up with Dr. Mike Gip and Fleet Contras  2. History of hip fracture   3. History of alcoholism ([place])  Proud that she quit, we will try anti-depressant since she seemed to be using alcohol to treat her anxiety and depression  4. Other fatigue  - COMPLETE METABOLIC PANEL WITH GFR - CBC with Differential/Platelet - TSH - Vitamin B12 - VITAMIN D 25 Hydroxy (Vit-D Deficiency, Fractures)  5. Depression, major, recurrent, mild ([facility])  - citalopram (CELEXA) 20 MG tablet; Take 1 tablet (20 mg total) by mouth daily.  Dispense: 30 tablet; Refill: 1  6. Hyperglycemia  - Hemoglobin A1c - Insulin, fasting  7. Osteoporosis without current pathological fracture, unspecified osteoporosis type  - COMPLETE METABOLIC PANEL WITH GFR - TSH - VITAMIN D 25 Hydroxy (Vit-D Deficiency, Fractures)  8. GAD (generalized anxiety disorder)  - citalopram (CELEXA) 20 MG tablet; Take 1 tablet (20 mg total) by mouth daily.  Dispense: 30 tablet; Refill: 1

## 2017-05-27 ENCOUNTER — Encounter: Payer: Self-pay | Admitting: General Surgery

## 2017-05-27 ENCOUNTER — Ambulatory Visit (INDEPENDENT_AMBULATORY_CARE_PROVIDER_SITE_OTHER): Payer: Medicare Other | Admitting: General Surgery

## 2017-05-27 VITALS — BP 128/64 | HR 68 | Resp 12 | Ht 62.0 in | Wt 141.0 lb

## 2017-05-27 DIAGNOSIS — Z170 Estrogen receptor positive status [ER+]: Secondary | ICD-10-CM

## 2017-05-27 DIAGNOSIS — C50411 Malignant neoplasm of upper-outer quadrant of right female breast: Secondary | ICD-10-CM

## 2017-05-27 DIAGNOSIS — Z1211 Encounter for screening for malignant neoplasm of colon: Secondary | ICD-10-CM | POA: Diagnosis not present

## 2017-05-27 LAB — COMPLETE METABOLIC PANEL WITH GFR
ALT: 12 U/L (ref 6–29)
AST: 16 U/L (ref 10–35)
Albumin: 4.3 g/dL (ref 3.6–5.1)
Alkaline Phosphatase: 48 U/L (ref 33–130)
BUN: 12 mg/dL (ref 7–25)
CHLORIDE: 107 mmol/L (ref 98–110)
CO2: 25 mmol/L (ref 20–31)
Calcium: 9.1 mg/dL (ref 8.6–10.4)
Creat: 0.61 mg/dL (ref 0.60–0.93)
GFR, Est African American: >89 mL/min (ref >=60)
GFR, Est Non African American: >89 mL/min (ref >=60)
GLUCOSE: 87 mg/dL (ref 65–99)
POTASSIUM: 4 mmol/L (ref 3.5–5.3)
SODIUM: 142 mmol/L (ref 135–146)
Total Bilirubin: 0.4 mg/dL (ref 0.2–1.2)
Total Protein: 6.7 g/dL (ref 6.1–8.1)

## 2017-05-27 LAB — HEMOGLOBIN A1C
HEMOGLOBIN A1C: 5.5 % (ref <5.7)
Mean Plasma Glucose: 111 mg/dL

## 2017-05-27 LAB — TSH: TSH: 1.8 m[IU]/L

## 2017-05-27 LAB — VITAMIN B12: Vitamin B-12: 289 pg/mL (ref 200–1100)

## 2017-05-27 MED ORDER — POLYETHYLENE GLYCOL 3350 17 GM/SCOOP PO POWD: ORAL | 0 refills | Status: DC

## 2017-05-27 NOTE — Patient Instructions (Addendum)
Follow up in one year after mammogram.  Colonoscopy, Adult A colonoscopy is an exam to look at the entire large intestine. During the exam, a lubricated, bendable tube is inserted into the anus and then passed into the rectum, colon, and other parts of the large intestine. A colonoscopy is often done as a part of normal colorectal screening or in response to certain symptoms, such as anemia, persistent diarrhea, abdominal pain, and blood in the stool. The exam can help screen for and diagnose medical problems, including:  Tumors.  Polyps.  Inflammation.  Areas of bleeding.  Tell a health care provider about:  Any allergies you have.  All medicines you are taking, including vitamins, herbs, eye drops, creams, and over-the-counter medicines.  Any problems you or family members have had with anesthetic medicines.  Any blood disorders you have.  Any surgeries you have had.  Any medical conditions you have.  Any problems you have had passing stool. What are the risks? Generally, this is a safe procedure. However, problems may occur, including:  Bleeding.  A tear in the intestine.  A reaction to medicines given during the exam.  Infection (rare).  What happens before the procedure? Eating and drinking restrictions Follow instructions from your health care provider about eating and drinking, which may include:  A few days before the procedure - follow a low-fiber diet. Avoid nuts, seeds, dried fruit, raw fruits, and vegetables.  1-3 days before the procedure - follow a clear liquid diet. Drink only clear liquids, such as clear broth or bouillon, black coffee or tea, clear juice, clear soft drinks or sports drinks, gelatin dessert, and popsicles. Avoid any liquids that contain red or purple dye.  On the day of the procedure - do not eat or drink anything during the 2 hours before the procedure, or within the time period that your health care provider recommends.  Bowel  prep If you were prescribed an oral bowel prep to clean out your colon:  Take it as told by your health care provider. Starting the day before your procedure, you will need to drink a large amount of medicated liquid. The liquid will cause you to have multiple loose stools until your stool is almost clear or light green.  If your skin or anus gets irritated from diarrhea, you may use these to relieve the irritation: ? Medicated wipes, such as adult wet wipes with aloe and vitamin E. ? A skin soothing-product like petroleum jelly.  If you vomit while drinking the bowel prep, take a break for up to 60 minutes and then begin the bowel prep again. If vomiting continues and you cannot take the bowel prep without vomiting, call your health care provider.  General instructions  Ask your health care provider about changing or stopping your regular medicines. This is especially important if you are taking diabetes medicines or blood thinners.  Plan to have someone take you home from the hospital or clinic. What happens during the procedure?  An IV tube may be inserted into one of your veins.  You will be given medicine to help you relax (sedative).  To reduce your risk of infection: ? Your health care team will wash or sanitize their hands. ? Your anal area will be washed with soap.  You will be asked to lie on your side with your knees bent.  Your health care provider will lubricate a long, thin, flexible tube. The tube will have a camera and a light on the end.  The tube will be inserted into your anus.  The tube will be gently eased through your rectum and colon.  Air will be delivered into your colon to keep it open. You may feel some pressure or cramping.  The camera will be used to take images during the procedure.  A small tissue sample may be removed from your body to be examined under a microscope (biopsy). If any potential problems are found, the tissue will be sent to a lab  for testing.  If small polyps are found, your health care provider may remove them and have them checked for cancer cells.  The tube that was inserted into your anus will be slowly removed. The procedure may vary among health care providers and hospitals. What happens after the procedure?  Your blood pressure, heart rate, breathing rate, and blood oxygen level will be monitored until the medicines you were given have worn off.  Do not drive for 24 hours after the exam.  You may have a small amount of blood in your stool.  You may pass gas and have mild abdominal cramping or bloating due to the air that was used to inflate your colon during the exam.  It is up to you to get the results of your procedure. Ask your health care provider, or the department performing the procedure, when your results will be ready. This information is not intended to replace advice given to you by your health care provider. Make sure you discuss any questions you have with your health care provider. Document Released: 10/10/2000 Document Revised: 08/13/2016 Document Reviewed: 12/25/2015 Elsevier Interactive Patient Education  2018 [employer].

## 2017-05-27 NOTE — Progress Notes (Addendum)
Patient ID: Rebecca Faulkner, female   DOB: 09/10/1941, 76 y.o.   MRN: [id number]  Chief Complaint  Patient presents with  . Follow-up    mammogram    HPI Rebecca Faulkner is a 76 y.o. female who presents for a breast evaluation. The most recent mammogram was done on 05/13/17 .  Patient does perform regular self breast checks and gets regular mammograms done. Patient states she is doing well, no new breast changes. She mentioned to Dr. Mike Gip she occasionally has sharp pains at her right mastectomy.  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   HPI  Past Medical History:  Diagnosis Date  . Breast cancer ([place]) 2013   right breast  . Heart murmur 1990  . Hypercholesteremia   . Lump or mass in breast   . Malignant neoplasm of upper-outer quadrant of female breast ([place]) 2013   R-breast T1, NO, MO, ER/PR pos., Her 2 neg.  . Osteoporosis   . Ulcer 1974    Past Surgical History:  Procedure Laterality Date  . BREAST SURGERY Right 2013  . CATARACT EXTRACTION W/PHACO Right 04/23/2016   Procedure: CATARACT EXTRACTION PHACO AND INTRAOCULAR LENS PLACEMENT ([place]) RIGHT EYE;  Surgeon: Leandrew Koyanagi, MD;  Location: [place];  Service: Ophthalmology;  Laterality: Right;  . CATARACT EXTRACTION W/PHACO Left 06/04/2016   Procedure: CATARACT EXTRACTION PHACO AND INTRAOCULAR LENS PLACEMENT ([facility]);  Surgeon: Leandrew Koyanagi, MD;  Location: [place];  Service: Ophthalmology;  Laterality: Left;  . [place]  . FRACTURE SURGERY Left 2011   arm, plate and 12 screws  . LEG SURGERY Right 01/02/2015  . MASTECTOMY Right 2013  . Right inguinal hernia repair  2013  . VEIN SURGERY Left 2015    Family History  Problem Relation Age of Onset  . Dementia Mother   . Stroke Father   . Diabetes Daughter   . Hypertension Daughter   . Breast cancer Neg Hx     Social History Social History  Substance Use Topics  . Smoking status: Never Smoker  .  Smokeless tobacco: Never Used  . Alcohol use No     Comment: Quit drinking  11/04/2016    Allergies  Allergen Reactions  . Codeine Nausea Only    No current facility-administered medications for this visit.    No current outpatient prescriptions on file.   Facility-Administered Medications Ordered in Other Visits  Medication Dose Route Frequency Provider Last Rate Last Dose  . 0.9 %  sodium chloride infusion   Intravenous Continuous Robert Bellow, MD 20 mL/hr at 07/22/17 [phone]      Review of Systems Review of Systems  Constitutional: Negative.   Respiratory: Negative.   Cardiovascular: Negative.     Blood pressure 128/64, pulse 68, resp. rate 12, height 5\' 2"  (1.575 m), weight 141 lb (64 kg).  Physical Exam Physical Exam  Constitutional: She is oriented to person, place, and time. She appears well-developed and well-nourished.  Eyes: Conjunctivae are normal. No scleral icterus.  Neck: Neck supple. No thyromegaly present.  Cardiovascular: Normal rate and regular rhythm.   Pulmonary/Chest: Effort normal and breath sounds normal. Left breast exhibits no inverted nipple, no mass, no nipple discharge, no skin change and no tenderness.  Right mastectomy site well healed.   Lymphadenopathy:    She has no cervical adenopathy.    She has no axillary adenopathy.  Neurological: She is alert and oriented to person, place, and time.  Skin:  Skin is warm and dry.    Data Reviewed Left breast screening mammogram dated 05/13/2017 was reviewed and compared to prior studies. No interval change. BI-RADS-1.  Bone density dated 05/12/2016 showed a T score of -2.7 of the femur, osteoporotic.  Forearm score showed osteopenia. No change from 2015.  CBC and comprehensive metabolic panel dated 82/95/6213 were reviewed. Normal renal function, normal liver function studies. Fasting blood sugar 110.  Hemoglobin 13.4 with an MCV of 89, white blood cell count of 7600 with a platelet count 2  61,000.   Assessment    No evidence of recurrent breast cancer.  Candidate for screening colonoscopy.    Plan         Patient will be asked to return to the office in one year with a left screening mammogram.  Mammogram will be ordered by Dr. Kem Parkinson office.  Prescription for prosthesis given.  Recommended getting a colonoscopy.  Colonoscopy with possible biopsy/polypectomy prn: Information regarding the procedure, including its potential risks and complications (including but not limited to perforation of the bowel, which may require emergency surgery to repair, and bleeding) was verbally given to the patient. Educational information regarding lower intestinal endoscopy was given to the patient. Written instructions for how to complete the bowel prep using Miralax were provided. The importance of drinking ample fluids to avoid dehydration as a result of the prep emphasized.    HPI, Physical Exam, Assessment and Plan have been scribed under the direction and in the presence of Hervey Ard, MD.  Verlene Mayer, CMA  I have completed the exam and reviewed the above documentation for accuracy and completeness.  I agree with the above.  Haematologist has been used and any errors in dictation or transcription are unintentional.  Hervey Ard, M.D., F.A.C.S.    Robert Bellow 07/22/2017, 9:22 AM  Patient has been scheduled for a colonoscopy on 07-22-17 at [facility]. Miralax prescription has been sent in to the patient's pharmacy today. It is okay for patient to continue an 81 mg aspirin once daily. Colonoscopy instructions have been reviewed with the patient. This patient is aware to call the office if they have further questions.   Dominga Ferry, CMA

## 2017-05-28 LAB — INSULIN, FASTING: Insulin fasting, serum: 4.7 u[IU]/mL (ref 2.0–19.6)

## 2017-05-30 LAB — VITAMIN D 25 HYDROXY (VIT D DEFICIENCY, FRACTURES): Vit D, 25-Hydroxy: 21 ng/mL — ABNORMAL LOW (ref 30–100)

## 2017-07-07 ENCOUNTER — Encounter: Payer: Self-pay | Admitting: Family Medicine

## 2017-07-07 ENCOUNTER — Ambulatory Visit (INDEPENDENT_AMBULATORY_CARE_PROVIDER_SITE_OTHER): Payer: Medicare Other | Admitting: Family Medicine

## 2017-07-07 VITALS — BP 124/82 | HR 70 | Temp 98.5°F | Resp 16 | Ht 62.0 in | Wt 142.0 lb

## 2017-07-07 DIAGNOSIS — F411 Generalized anxiety disorder: Secondary | ICD-10-CM | POA: Diagnosis not present

## 2017-07-07 DIAGNOSIS — E559 Vitamin D deficiency, unspecified: Secondary | ICD-10-CM | POA: Insufficient documentation

## 2017-07-07 DIAGNOSIS — F330 Major depressive disorder, recurrent, mild: Secondary | ICD-10-CM | POA: Diagnosis not present

## 2017-07-07 DIAGNOSIS — E538 Deficiency of other specified B group vitamins: Secondary | ICD-10-CM

## 2017-07-07 DIAGNOSIS — Z23 Encounter for immunization: Secondary | ICD-10-CM

## 2017-07-07 DIAGNOSIS — H401131 Primary open-angle glaucoma, bilateral, mild stage: Secondary | ICD-10-CM | POA: Diagnosis not present

## 2017-07-07 MED ORDER — VITAMIN D (ERGOCALCIFEROL) 1.25 MG (50000 UNIT) PO CAPS: 50000.0000 [IU] | ORAL_CAPSULE | ORAL | 0 refills | Status: DC

## 2017-07-07 MED ORDER — CYANOCOBALAMIN 1000 MCG/ML IJ SOLN
1000.0000 ug | Freq: Once | INTRAMUSCULAR | Status: AC
  Administered 2017-07-07: 1000 ug via INTRAMUSCULAR

## 2017-07-07 MED ORDER — CITALOPRAM HYDROBROMIDE 20 MG PO TABS: 20.0000 mg | ORAL_TABLET | Freq: Every day | ORAL | 0 refills | Status: DC

## 2017-07-07 NOTE — Progress Notes (Signed)
Name: Rebecca Faulkner   MRN: [id number]    DOB: 11-15-1940   Date:07/07/2017       Progress Note  Subjective  Chief Complaint  Chief Complaint  Patient presents with  . Depression    6 week follow up pt stated that she feels better but still depressed sometimes    HPI  Depression Mild / GAD: She quit drinking 11/04/2016 she used to drink 7-12 beers every night. Since she quit drinking she noticed that she got more snappy, angry, and continues to crave alcohol when very stressed.. She states that she quit for her health. PHQ9 was high at 10 on her last visit ( 05/2017), and we started her on Celexa, she denies side effects of medication, she is sleeping well, she is no longer feeling hopeless, more calm at work. PHQ9 today is down to 5. We will continue current dose of medication  Low B12 and Vitamin D: we will start replacement today, she wants B12 injections.    Patient Active Problem List   Diagnosis Date Noted  . Depression, major, recurrent, mild ([place]) 07/07/2017  . B12 deficiency 07/07/2017  . Vitamin D deficiency 07/07/2017  . Screening for colon cancer 05/27/2017  . Malignant neoplasm of upper-outer quadrant of right breast in female, estrogen receptor positive ([place]) 05/27/2017  . Glaucoma 05/26/2017  . History of alcoholism ([place]) 05/26/2017  . History of hip fracture 05/10/2015  . Breast cancer, right ([place]) 05/27/2012  . Estrogen receptor positive status (ER+) 05/14/2012    Past Surgical History:  Procedure Laterality Date  . BREAST SURGERY Right 2013  . CATARACT EXTRACTION W/PHACO Right 04/23/2016   Procedure: CATARACT EXTRACTION PHACO AND INTRAOCULAR LENS PLACEMENT ([place]) RIGHT EYE;  Surgeon: Leandrew Koyanagi, MD;  Location: [place];  Service: Ophthalmology;  Laterality: Right;  . CATARACT EXTRACTION W/PHACO Left 06/04/2016   Procedure: CATARACT EXTRACTION PHACO AND INTRAOCULAR LENS PLACEMENT ([facility]);  Surgeon: Leandrew Koyanagi, MD;  Location: [facility];  Service: Ophthalmology;  Laterality: Left;  . [place]  . FRACTURE SURGERY Left 2011   arm, plate and 12 screws  . LEG SURGERY Right 01/02/2015  . MASTECTOMY Right 2013  . Right inguinal hernia repair  2013  . VEIN SURGERY Left 2015    Family History  Problem Relation Age of Onset  . Dementia Mother   . Stroke Father   . Diabetes Daughter   . Hypertension Daughter   . Breast cancer Neg Hx     Social History   Social History  . Marital status: Divorced    Spouse name: N/A  . Number of children: N/A  . Years of education: N/A   Occupational History  . Not on file.   Social History Main Topics  . Smoking status: Never Smoker  . Smokeless tobacco: Never Used  . Alcohol use No     Comment: Quit drinking  11/04/2016  . Drug use: No  . Sexual activity: Not Currently   Other Topics Concern  . Not on file   Social History Narrative   Works full time at [employer]. She is the Freight forwarder and to go window   She has one son  - that lives in [place]   Two dogs and one cat.      Current Outpatient Prescriptions:  .  alendronate (FOSAMAX) 70 MG tablet, Take 1 tablet (70 mg total) by mouth once a week., Disp: 12 tablet, Rfl: 2 .  aspirin EC 81 MG tablet,  Take 81 mg by mouth daily., Disp: , Rfl:  .  calcium carbonate (OS-CAL) 600 MG TABS, Take 600 mg by mouth 2 (two) times daily with a meal., Disp: , Rfl:  .  citalopram (CELEXA) 20 MG tablet, Take 1 tablet (20 mg total) by mouth daily., Disp: 90 tablet, Rfl: 0 .  letrozole (FEMARA) 2.5 MG tablet, Take 1 tablet (2.5 mg total) by mouth daily., Disp: 90 tablet, Rfl: 1 .  polyethylene glycol powder (GLYCOLAX/MIRALAX) powder, 255 grams one bottle for colonoscopy prep, Disp: 255 g, Rfl: 0 .  Vitamin D, Ergocalciferol, (DRISDOL) 50000 units CAPS capsule, Take 1 capsule (50,000 Units total) by mouth every 7 (seven) days., Disp: 12 capsule, Rfl: 0  Current Facility-Administered Medications:  .  cyanocobalamin  ((VITAMIN B-12)) injection 1,000 mcg, 1,000 mcg, Intramuscular, Once, Steele Sizer, MD  Allergies  Allergen Reactions  . Codeine Nausea Only     ROS  Constitutional: Negative for fever or weight change.  Respiratory: Positive  for mild cough, no shortness of breath.   Cardiovascular: Negative for chest pain or palpitations.  Gastrointestinal: Negative for abdominal pain, no bowel changes.  Musculoskeletal: Negative for gait problem or joint swelling.  Skin: Negative for rash.  Neurological: Negative for dizziness or headache.  No other specific complaints in a complete review of systems (except as listed in HPI above).  Objective  Vitals:   07/07/17 0759  BP: 124/82  Pulse: 70  Resp: 16  Temp: 98.5 F (36.9 C)  SpO2: 97%  Weight: 142 lb (64.4 kg)  Height: '5\' 2"'  (1.575 m)    Body mass index is 25.97 kg/m.  Physical Exam  Constitutional: Patient appears well-developed and well-nourished.  No distress.  HEENT: head atraumatic, normocephalic, pupils equal and reactive to light, neck supple, throat within normal limits Cardiovascular: Normal rate, regular rhythm and normal heart sounds.  No murmur heard. No BLE edema. Pulmonary/Chest: Effort normal and breath sounds normal. No respiratory distress. Abdominal: Soft.  There is no tenderness. Psychiatric: Patient has a normal mood and affect. behavior is normal. Judgment and thought content normal.  Recent Results (from the past 2160 hour(s))  COMPLETE METABOLIC PANEL WITH GFR     Status: None   Collection Time: 05/26/17  2:51 PM  Result Value Ref Range   Sodium 142 135 - 146 mmol/L   Potassium 4.0 3.5 - 5.3 mmol/L   Chloride 107 98 - 110 mmol/L   CO2 25 20 - 31 mmol/L   Glucose, Bld 87 65 - 99 mg/dL   BUN 12 7 - 25 mg/dL   Creat 0.61 0.60 - 0.93 mg/dL    Comment:   For patients > or = 74 years of age: The upper reference limit for Creatinine is approximately 13% higher for people identified  as African-American.      Total Bilirubin 0.4 0.2 - 1.2 mg/dL   Alkaline Phosphatase 48 33 - 130 U/L   AST 16 10 - 35 U/L   ALT 12 6 - 29 U/L   Total Protein 6.7 6.1 - 8.1 g/dL   Albumin 4.3 3.6 - 5.1 g/dL   Calcium 9.1 8.6 - 10.4 mg/dL   GFR, Est African American >89 >=60 mL/min   GFR, Est Non African American >89 >=60 mL/min  CBC with Differential/Platelet     Status: None   Collection Time: 05/26/17  2:51 PM  Result Value Ref Range   WBC 7.4 3.8 - 10.8 K/uL   RBC 4.79 3.80 - 5.10 MIL/uL  Hemoglobin 14.4 11.7 - 15.5 g/dL   HCT 43.0 35.0 - 45.0 %   MCV 89.8 80.0 - 100.0 fL   MCH 30.1 27.0 - 33.0 pg   MCHC 33.5 32.0 - 36.0 g/dL   RDW 13.7 11.0 - 15.0 %   Platelets 264 140 - 400 K/uL   MPV 9.9 7.5 - 12.5 fL   Neutro Abs 5,106 1,500 - 7,800 cells/uL   Lymphs Abs 1,480 850 - 3,900 cells/uL   Monocytes Absolute 666 200 - 950 cells/uL   Eosinophils Absolute 148 15 - 500 cells/uL   Basophils Absolute 0 0 - 200 cells/uL   Neutrophils Relative % 69 %   Lymphocytes Relative 20 %   Monocytes Relative 9 %   Eosinophils Relative 2 %   Basophils Relative 0 %   Smear Review Criteria for review not met   Hemoglobin A1c     Status: None   Collection Time: 05/26/17  2:51 PM  Result Value Ref Range   Hgb A1c MFr Bld 5.5 <5.7 %    Comment:   For the purpose of screening for the presence of diabetes:   <5.7%       Consistent with the absence of diabetes 5.7-6.4 %   Consistent with increased risk for diabetes (prediabetes) >=6.5 %     Consistent with diabetes   This assay result is consistent with a decreased risk of diabetes.   Currently, no consensus exists regarding use of hemoglobin A1c for diagnosis of diabetes in children.   According to American Diabetes Association (ADA) guidelines, hemoglobin A1c <7.0% represents optimal control in non-pregnant diabetic patients. Different metrics may apply to specific patient populations. Standards of Medical Care in Diabetes (ADA).       Mean Plasma Glucose 111 mg/dL  Insulin, fasting     Status: None   Collection Time: 05/26/17  2:51 PM  Result Value Ref Range   Insulin fasting, serum 4.7 2.0 - 19.6 uIU/mL    Comment:   This insulin assay shows strong cross-reactivity for some insulin analogs (lispro, aspart, and glargine) and much lower cross-reactivity with others (detemir, glulisine).   Stimulated Insulin reference intervals were established using the Siemens Immulite assay. These values are provided for general guidance only.   TSH     Status: None   Collection Time: 05/26/17  2:51 PM  Result Value Ref Range   TSH 1.80 mIU/L    Comment:   Reference Range   > or = 20 Years  0.40-4.50   Pregnancy Range First trimester  0.26-2.66 Second trimester 0.55-2.73 Third trimester  0.43-2.91     Vitamin B12     Status: None   Collection Time: 05/26/17  2:51 PM  Result Value Ref Range   Vitamin B-12 289 200 - 1,100 pg/mL  VITAMIN D 25 Hydroxy (Vit-D Deficiency, Fractures)     Status: Abnormal   Collection Time: 05/26/17  2:51 PM  Result Value Ref Range   Vit D, 25-Hydroxy 21 (L) 30 - 100 ng/mL    Comment: Vitamin D Status           25-OH Vitamin D        Deficiency                <20 ng/mL        Insufficiency         20 - 29 ng/mL        Optimal             >  or = 30 ng/mL   For 25-OH Vitamin D testing on patients on D2-supplementation and patients for whom quantitation of D2 and D3 fractions is required, the QuestAssureD 25-OH VIT D, (D2,D3), LC/MS/MS is recommended: order code [phone] (patients > 2 yrs).       PHQ2/9: Depression screen [facility] 2/9 07/07/2017 05/26/2017 05/26/2017  Decreased Interest 0 1 0  Down, Depressed, Hopeless '2 3 1  ' PHQ - 2 Score '2 4 1  ' Altered sleeping 0 0 -  Tired, decreased energy 3 3 -  Change in appetite 0 0 -  Feeling bad or failure about yourself  0 3 -  Trouble concentrating 0 0 -  Moving slowly or fidgety/restless 0 0 -  Suicidal thoughts 0 0 -  PHQ-9 Score 5 10 -   Difficult doing work/chores Not difficult at all - -     Fall Risk: Fall Risk  05/26/2017  Falls in the past year? No     Assessment & Plan  1. Depression, major, recurrent, mild (HCC)  - citalopram (CELEXA) 20 MG tablet; Take 1 tablet (20 mg total) by mouth daily.  Dispense: 90 tablet; Refill: 0  2. GAD (generalized anxiety disorder)  - citalopram (CELEXA) 20 MG tablet; Take 1 tablet (20 mg total) by mouth daily.  Dispense: 90 tablet; Refill: 0  3. B12 deficiency  - cyanocobalamin ((VITAMIN B-12)) injection 1,000 mcg; Inject 1 mL (1,000 mcg total) into the muscle once.  4. Vitamin D deficiency  - Vitamin D, Ergocalciferol, (DRISDOL) 50000 units CAPS capsule; Take 1 capsule (50,000 Units total) by mouth every 7 (seven) days.  Dispense: 12 capsule; Refill: 0  5. Needs flu shot  - Flu vaccine HIGH DOSE PF

## 2017-07-09 ENCOUNTER — Other Ambulatory Visit: Payer: Self-pay | Admitting: Family Medicine

## 2017-07-09 DIAGNOSIS — F411 Generalized anxiety disorder: Secondary | ICD-10-CM

## 2017-07-09 DIAGNOSIS — F330 Major depressive disorder, recurrent, mild: Secondary | ICD-10-CM

## 2017-07-09 DIAGNOSIS — E559 Vitamin D deficiency, unspecified: Secondary | ICD-10-CM

## 2017-07-09 NOTE — Telephone Encounter (Signed)
PT IS NEEDING REFILL ON VIT D AND CITALOPRAM  TO [facility]

## 2017-07-10 MED ORDER — VITAMIN D (ERGOCALCIFEROL) 1.25 MG (50000 UNIT) PO CAPS: 50000.0000 [IU] | ORAL_CAPSULE | ORAL | 0 refills | Status: DC

## 2017-07-10 MED ORDER — CITALOPRAM HYDROBROMIDE 20 MG PO TABS: 20.0000 mg | ORAL_TABLET | Freq: Every day | ORAL | 0 refills | Status: DC

## 2017-07-16 ENCOUNTER — Telehealth: Payer: Self-pay | Admitting: *Deleted

## 2017-07-16 NOTE — Telephone Encounter (Signed)
Message left for patient to call the office.   We need to confirm no medication changes or changes in her health since her last office visit. Also, need to confirm that she has picked up her Miralax prescription.   Patient is currently scheduled for a colonoscopy with Dr. Bary Castilla on 07-22-17 at [facility].

## 2017-07-22 ENCOUNTER — Ambulatory Visit: Payer: Medicare Other | Admitting: Anesthesiology

## 2017-07-22 ENCOUNTER — Ambulatory Visit
Admission: RE | Admit: 2017-07-22 | Discharge: 2017-07-22 | Disposition: A | Discharge status: Home / Self Care | Payer: Medicare Other | Source: Ambulatory Visit | Attending: General Surgery | Admitting: General Surgery

## 2017-07-22 ENCOUNTER — Encounter
Admission: RE | Disposition: A | Discharge status: Home / Self Care | Payer: Self-pay | Source: Ambulatory Visit | Attending: General Surgery

## 2017-07-22 DIAGNOSIS — Z7983 Long term (current) use of bisphosphonates: Secondary | ICD-10-CM | POA: Diagnosis not present

## 2017-07-22 DIAGNOSIS — Z853 Personal history of malignant neoplasm of breast: Secondary | ICD-10-CM | POA: Diagnosis not present

## 2017-07-22 DIAGNOSIS — D128 Benign neoplasm of rectum: Secondary | ICD-10-CM | POA: Diagnosis not present

## 2017-07-22 DIAGNOSIS — M810 Age-related osteoporosis without current pathological fracture: Secondary | ICD-10-CM | POA: Insufficient documentation

## 2017-07-22 DIAGNOSIS — Z79899 Other long term (current) drug therapy: Secondary | ICD-10-CM | POA: Insufficient documentation

## 2017-07-22 DIAGNOSIS — Z1211 Encounter for screening for malignant neoplasm of colon: Secondary | ICD-10-CM | POA: Diagnosis not present

## 2017-07-22 DIAGNOSIS — K5730 Diverticulosis of large intestine without perforation or abscess without bleeding: Secondary | ICD-10-CM

## 2017-07-22 DIAGNOSIS — K621 Rectal polyp: Secondary | ICD-10-CM | POA: Diagnosis not present

## 2017-07-22 DIAGNOSIS — K5790 Diverticulosis of intestine, part unspecified, without perforation or abscess without bleeding: Secondary | ICD-10-CM | POA: Diagnosis not present

## 2017-07-22 DIAGNOSIS — Z7982 Long term (current) use of aspirin: Secondary | ICD-10-CM | POA: Diagnosis not present

## 2017-07-22 HISTORY — PX: COLONOSCOPY WITH PROPOFOL: SHX5780

## 2017-07-22 SURGERY — COLONOSCOPY WITH PROPOFOL
Anesthesia: General

## 2017-07-22 MED ORDER — PROPOFOL 10 MG/ML IV BOLUS
INTRAVENOUS | Status: AC
  Filled 2017-07-22: qty 20

## 2017-07-22 MED ORDER — LIDOCAINE HCL (PF) 2 % IJ SOLN
INTRAMUSCULAR | Status: AC
  Filled 2017-07-22: qty 2

## 2017-07-22 MED ORDER — SODIUM CHLORIDE 0.9 % IV SOLN
INTRAVENOUS | Status: DC
  Administered 2017-07-22: 09:00:00 via INTRAVENOUS

## 2017-07-22 MED ORDER — FENTANYL CITRATE (PF) 100 MCG/2ML IJ SOLN
INTRAMUSCULAR | Status: DC | PRN
  Administered 2017-07-22: 100 ug via INTRAVENOUS

## 2017-07-22 MED ORDER — PROPOFOL 10 MG/ML IV BOLUS
INTRAVENOUS | Status: DC | PRN
  Administered 2017-07-22: 30 mg via INTRAVENOUS

## 2017-07-22 MED ORDER — PROPOFOL 500 MG/50ML IV EMUL
INTRAVENOUS | Status: DC | PRN
  Administered 2017-07-22: 120 ug/kg/min via INTRAVENOUS

## 2017-07-22 MED ORDER — FENTANYL CITRATE (PF) 100 MCG/2ML IJ SOLN
INTRAMUSCULAR | Status: AC
  Filled 2017-07-22: qty 2

## 2017-07-22 MED ORDER — LIDOCAINE HCL (CARDIAC) 20 MG/ML IV SOLN
INTRAVENOUS | Status: DC | PRN
  Administered 2017-07-22: 2 mL via INTRAVENOUS

## 2017-07-22 NOTE — Anesthesia Postprocedure Evaluation (Signed)
Anesthesia Post Note  Patient: Rebecca Faulkner  Procedure(s) Performed: Procedure(s) (LRB): COLONOSCOPY WITH PROPOFOL (N/A)  Patient location during evaluation: PACU Anesthesia Type: General Level of consciousness: awake Pain management: pain level controlled Vital Signs Assessment: post-procedure vital signs reviewed and stable Cardiovascular status: stable Anesthetic complications: no     Last Vitals:  Vitals:   07/22/17 1011 07/22/17 1021  BP: (!) 148/83 (!) 155/88  Pulse: 63 62  Resp: 15 15  Temp:    SpO2: 100% 99%    Last Pain:  Vitals:   07/22/17 0951  TempSrc: Tympanic                 VAN STAVEREN,Luceal Hollibaugh

## 2017-07-22 NOTE — Transfer of Care (Signed)
Immediate Anesthesia Transfer of Care Note  Patient: Rebecca Faulkner  Procedure(s) Performed: Procedure(s): COLONOSCOPY WITH PROPOFOL (N/A)  Patient Location: PACU  Anesthesia Type:General  Level of Consciousness: awake  Airway & Oxygen Therapy: Patient Spontanous Breathing and Patient connected to nasal cannula oxygen  Post-op Assessment: Report given to RN and Post -op Vital signs reviewed and stable  Post vital signs: Reviewed  Last Vitals:  Vitals:   07/22/17 0819  BP: (!) 141/81  Pulse: 78  Resp: 18  Temp: (!) 36.3 C  SpO2: 100%    Last Pain:  Vitals:   07/22/17 0819  TempSrc: Tympanic         Complications: No apparent anesthesia complications

## 2017-07-22 NOTE — Op Note (Signed)
[facility]astroenterology Patient Name: Rebecca Faulkner Procedure Date: 07/22/2017 9:20 AM MRN: [id number] Account #: [id number] Date of Birth: 10-Feb-1941 Admit Type: Outpatient Age: 76 Room: [facility] ENDO ROOM 4 Gender: Female Note Status: Finalized Procedure:            Colonoscopy Providers:            Robert Bellow, MD Referring MD:         Bethena Roys. Sowles, MD (Referring MD) Medicines:            Monitored Anesthesia Care Complications:        No immediate complications. Procedure:            Pre-Anesthesia Assessment:                       - Prior to the procedure, a History and Physical was                        performed, and patient medications, allergies and                        sensitivities were reviewed. The patient's tolerance of                        previous anesthesia was reviewed.                       - The risks and benefits of the procedure and the                        sedation options and risks were discussed with the                        patient. All questions were answered and informed                        consent was obtained.                       After obtaining informed consent, the colonoscope was                        passed under direct vision. Throughout the procedure,                        the patient's blood pressure, pulse, and oxygen                        saturations were monitored continuously. The                        Colonoscope was introduced through the anus and                        advanced to the the cecum, identified by appendiceal                        orifice and ileocecal valve. The colonoscopy was                        performed without difficulty. The patient tolerated the  procedure well. The quality of the bowel preparation                        was good. The quality of the bowel preparation was                        excellent. Findings:      Many medium-mouthed  diverticula were found in the sigmoid colon.      A 5 mm polyp was found in the rectum. The polyp was sessile. Biopsies       were taken with a cold forceps for histology.      The retroflexed view of the distal rectum and anal verge was normal and       showed no anal or rectal abnormalities. Impression:           - Diverticulosis in the sigmoid colon.                       - One 5 mm polyp in the rectum. Biopsied.                       - The distal rectum and anal verge are normal on                        retroflexion view. Recommendation:       - Telephone endoscopist for pathology results in 1 week. Procedure Code(s):    --- Professional ---                       [phone], Colonoscopy, flexible; with biopsy, single or                        multiple Diagnosis Code(s):    --- Professional ---                       K57.30, Diverticulosis of large intestine without                        perforation or abscess without bleeding                       K62.1, Rectal polyp CPT copyright 2016 American Medical Association. All rights reserved. The codes documented in this report are preliminary and upon coder review may  be revised to meet current compliance requirements. Robert Bellow, MD 07/22/2017 9:49:39 AM This report has been signed electronically. Number of Addenda: 0 Note Initiated On: 07/22/2017 9:20 AM Scope Withdrawal Time: 0 hours 9 minutes 50 seconds  Total Procedure Duration: 0 hours 18 minutes 28 seconds       [facility]

## 2017-07-22 NOTE — H&P (Signed)
Rebecca Faulkner [id number] 09/03/1941     HPI: Healthy 76 y/o woman for a screening colonoscopy. Tolerated the prep well.   Prescriptions Prior to Admission  Medication Sig Dispense Refill Last Dose  . aspirin EC 81 MG tablet Take 81 mg by mouth daily.   Past Week at Unknown time  . calcium carbonate (OS-CAL) 600 MG TABS Take 600 mg by mouth 2 (two) times daily with a meal.   Past Week at Unknown time  . citalopram (CELEXA) 20 MG tablet Take 1 tablet (20 mg total) by mouth daily. 90 tablet 0 Past Week at Unknown time  . letrozole (FEMARA) 2.5 MG tablet Take 1 tablet (2.5 mg total) by mouth daily. 90 tablet 1 Past Week at Unknown time  . Vitamin D, Ergocalciferol, (DRISDOL) 50000 units CAPS capsule Take 1 capsule (50,000 Units total) by mouth every 7 (seven) days. 12 capsule 0 Past Month at Unknown time  . alendronate (FOSAMAX) 70 MG tablet Take 1 tablet (70 mg total) by mouth once a week. 12 tablet 2 07/19/2017  . polyethylene glycol powder (GLYCOLAX/MIRALAX) powder 255 grams one bottle for colonoscopy prep 255 g 0 Taking   Allergies  Allergen Reactions  . Codeine Nausea Only   Past Medical History:  Diagnosis Date  . Breast cancer ([place]) 2013   right breast  . Heart murmur 1990  . Hypercholesteremia   . Lump or mass in breast   . Malignant neoplasm of upper-outer quadrant of female breast ([place]) 2013   R-breast T1, NO, MO, ER/PR pos., Her 2 neg.  . Osteoporosis   . Ulcer 1974   Past Surgical History:  Procedure Laterality Date  . BREAST SURGERY Right 2013  . CATARACT EXTRACTION W/PHACO Right 04/23/2016   Procedure: CATARACT EXTRACTION PHACO AND INTRAOCULAR LENS PLACEMENT ([place]) RIGHT EYE;  Surgeon: Leandrew Koyanagi, MD;  Location: [facility];  Service: Ophthalmology;  Laterality: Right;  . CATARACT EXTRACTION W/PHACO Left 06/04/2016   Procedure: CATARACT EXTRACTION PHACO AND INTRAOCULAR LENS PLACEMENT ([facility]);  Surgeon: Leandrew Koyanagi, MD;  Location: [facility];  Service: Ophthalmology;  Laterality: Left;  . [place]  . FRACTURE SURGERY Left 2011   arm, plate and 12 screws  . LEG SURGERY Right 01/02/2015  . MASTECTOMY Right 2013  . Right inguinal hernia repair  2013  . VEIN SURGERY Left 2015   Social History   Social History  . Marital status: Divorced    Spouse name: N/A  . Number of children: N/A  . Years of education: N/A   Occupational History  . Not on file.   Social History Main Topics  . Smoking status: Never Smoker  . Smokeless tobacco: Never Used  . Alcohol use No     Comment: Quit drinking  11/04/2016  . Drug use: No  . Sexual activity: Not Currently   Other Topics Concern  . Not on file   Social History Narrative   Works full time at [employer]. She is the Freight forwarder and to go window   She has one son  - that lives in [place]   Two dogs and one cat.    Social History   Social History Narrative   Works full time at [employer]. She is the Freight forwarder and to go window   She has one son  - that lives in [place]   Two dogs and one cat.      ROS: Negative.     PE: HEENT: Negative. Lungs:  Clear. Cardio: RR.  Assessment/Plan:  Proceed with planned endoscopy.  Robert Bellow 07/22/2017

## 2017-07-22 NOTE — Anesthesia Preprocedure Evaluation (Signed)
Anesthesia Evaluation  Patient identified by MRN, date of birth, ID band Patient awake    Reviewed: Allergy & Precautions, NPO status , Patient's Chart, lab work & pertinent test results  Airway Mallampati: III       Dental  (+) Teeth Intact, Missing   Pulmonary neg pulmonary ROS,    breath sounds clear to auscultation       Cardiovascular Exercise Tolerance: Good  Rhythm:Regular     Neuro/Psych negative neurological ROS     GI/Hepatic negative GI ROS, (+)     substance abuse  alcohol use,   Endo/Other  negative endocrine ROS  Renal/GU negative Renal ROS     Musculoskeletal negative musculoskeletal ROS (+)   Abdominal (+) + obese,   Peds negative pediatric ROS (+)  Hematology negative hematology ROS (+)   Anesthesia Other Findings   Reproductive/Obstetrics                             Anesthesia Physical Anesthesia Plan  ASA: III  Anesthesia Plan: General   Post-op Pain Management:    Induction: Intravenous  PONV Risk Score and Plan: 0  Airway Management Planned: Natural Airway and Nasal Cannula  Additional Equipment:   Intra-op Plan:   Post-operative Plan:   Informed Consent: I have reviewed the patients History and Physical, chart, labs and discussed the procedure including the risks, benefits and alternatives for the proposed anesthesia with the patient or authorized representative who has indicated his/her understanding and acceptance.     Plan Discussed with: Surgeon  Anesthesia Plan Comments:         Anesthesia Quick Evaluation

## 2017-07-22 NOTE — Anesthesia Post-op Follow-up Note (Signed)
Anesthesia QCDR form completed.        

## 2017-07-23 ENCOUNTER — Encounter: Payer: Self-pay | Admitting: General Surgery

## 2017-07-23 LAB — SURGICAL PATHOLOGY

## 2017-07-24 ENCOUNTER — Telehealth: Payer: Self-pay | Admitting: General Surgery

## 2017-07-24 NOTE — Telephone Encounter (Signed)
Notified pathology was benign. Need for repeat exam in 5 years. Patient was at work and reported doing well.

## 2017-07-27 ENCOUNTER — Ambulatory Visit: Payer: Medicare Other | Admitting: Hematology and Oncology

## 2017-07-27 ENCOUNTER — Other Ambulatory Visit: Payer: Medicare Other

## 2017-07-28 ENCOUNTER — Other Ambulatory Visit: Payer: Self-pay | Admitting: *Deleted

## 2017-07-28 ENCOUNTER — Inpatient Hospital Stay (HOSPITAL_BASED_OUTPATIENT_CLINIC_OR_DEPARTMENT_OTHER): Payer: Medicare Other | Admitting: Hematology and Oncology

## 2017-07-28 ENCOUNTER — Inpatient Hospital Stay: Payer: Medicare Other | Attending: Hematology and Oncology

## 2017-07-28 ENCOUNTER — Encounter: Payer: Self-pay | Admitting: Hematology and Oncology

## 2017-07-28 VITALS — BP 114/68 | HR 70 | Temp 97.4°F | Resp 18 | Wt 141.2 lb

## 2017-07-28 DIAGNOSIS — Z79899 Other long term (current) drug therapy: Secondary | ICD-10-CM | POA: Diagnosis not present

## 2017-07-28 DIAGNOSIS — Z170 Estrogen receptor positive status [ER+]: Secondary | ICD-10-CM | POA: Insufficient documentation

## 2017-07-28 DIAGNOSIS — Z9011 Acquired absence of right breast and nipple: Secondary | ICD-10-CM

## 2017-07-28 DIAGNOSIS — E7800 Pure hypercholesterolemia, unspecified: Secondary | ICD-10-CM | POA: Diagnosis not present

## 2017-07-28 DIAGNOSIS — Z853 Personal history of malignant neoplasm of breast: Secondary | ICD-10-CM

## 2017-07-28 DIAGNOSIS — Z7982 Long term (current) use of aspirin: Secondary | ICD-10-CM | POA: Insufficient documentation

## 2017-07-28 DIAGNOSIS — C50411 Malignant neoplasm of upper-outer quadrant of right female breast: Secondary | ICD-10-CM | POA: Diagnosis not present

## 2017-07-28 DIAGNOSIS — Z79811 Long term (current) use of aromatase inhibitors: Secondary | ICD-10-CM | POA: Insufficient documentation

## 2017-07-28 DIAGNOSIS — Z8781 Personal history of (healed) traumatic fracture: Secondary | ICD-10-CM | POA: Insufficient documentation

## 2017-07-28 DIAGNOSIS — F1010 Alcohol abuse, uncomplicated: Secondary | ICD-10-CM

## 2017-07-28 DIAGNOSIS — K5730 Diverticulosis of large intestine without perforation or abscess without bleeding: Secondary | ICD-10-CM | POA: Insufficient documentation

## 2017-07-28 DIAGNOSIS — M810 Age-related osteoporosis without current pathological fracture: Secondary | ICD-10-CM | POA: Diagnosis not present

## 2017-07-28 DIAGNOSIS — C50911 Malignant neoplasm of unspecified site of right female breast: Secondary | ICD-10-CM

## 2017-07-28 LAB — CBC WITH DIFFERENTIAL/PLATELET
Basophils Absolute: 0.1 10*3/uL (ref 0–0.1)
Basophils Relative: 1 %
Eosinophils Absolute: 0.1 10*3/uL (ref 0–0.7)
Eosinophils Relative: 3 %
HCT: 39 % (ref 35.0–47.0)
Hemoglobin: 13.3 g/dL (ref 12.0–16.0)
Lymphocytes Relative: 23 %
Lymphs Abs: 1.4 10*3/uL (ref 1.0–3.6)
MCH: 29.9 pg (ref 26.0–34.0)
MCHC: 34 g/dL (ref 32.0–36.0)
MCV: 88 fL (ref 80.0–100.0)
Monocytes Absolute: 0.6 10*3/uL (ref 0.2–0.9)
Monocytes Relative: 10 %
Neutro Abs: 3.7 10*3/uL (ref 1.4–6.5)
Neutrophils Relative %: 63 %
Platelets: 224 10*3/uL (ref 150–440)
RBC: 4.43 MIL/uL (ref 3.80–5.20)
RDW: 13.2 % (ref 11.5–14.5)
WBC: 5.9 10*3/uL (ref 3.6–11.0)

## 2017-07-28 LAB — COMPREHENSIVE METABOLIC PANEL
ALT: 14 U/L (ref 14–54)
AST: 20 U/L (ref 15–41)
Albumin: 4.3 g/dL (ref 3.5–5.0)
Alkaline Phosphatase: 41 U/L (ref 38–126)
Anion gap: 7 (ref 5–15)
BUN: 14 mg/dL (ref 6–20)
CO2: 26 mmol/L (ref 22–32)
Calcium: 9.3 mg/dL (ref 8.9–10.3)
Chloride: 104 mmol/L (ref 101–111)
Creatinine, Ser: 0.61 mg/dL (ref 0.44–1.00)
GFR calc Af Amer: >60 mL/min (ref >60)
GFR calc non Af Amer: >60 mL/min (ref >60)
Glucose, Bld: 98 mg/dL (ref 65–99)
Potassium: 4.7 mmol/L (ref 3.5–5.1)
Sodium: 137 mmol/L (ref 135–145)
Total Bilirubin: 0.7 mg/dL (ref 0.3–1.2)
Total Protein: 7.3 g/dL (ref 6.5–8.1)

## 2017-07-28 NOTE — Progress Notes (Signed)
Patient offers no complaints today.  Patient had her colonoscopy last week.  States she had one small polyp that came back negative.

## 2017-07-28 NOTE — Progress Notes (Signed)
[facility] day:  07/28/2017  Chief Complaint: Rebecca Faulkner is a 76 y.o. female with stage IA right breast cancer who is seen for 6 month assessment.  HPI:   The patient was last seen in the medical oncology clinic on 01/19/2017.  At that time, she felt good.  Exam was unremarkable.  Labs included a normal CBC, CMP, and CA27.29.  Left mammogram on 05/13/2017 revealed no evidence of malignancy.  Patient had a colonoscopy on 07/22/2017 with Dr. Bary Castilla.  She had diverticulosis in the sigmoid colon and one 5 mm polyp in the rectum.  Pathology revealed a tubular adenoma.  During the interim, patient doing well. She has no physical complaints today. She denies any breast concerns.  Her weight is stable. Patient currently taking her Femara as previously prescribed.    Past Medical History:  Diagnosis Date  . Breast cancer ([place]) 2013   right breast  . Heart murmur 1990  . Hypercholesteremia   . Lump or mass in breast   . Malignant neoplasm of upper-outer quadrant of female breast ([place]) 2013   R-breast T1, NO, MO, ER/PR pos., Her 2 neg.  . Osteoporosis   . Ulcer 1974    Past Surgical History:  Procedure Laterality Date  . BREAST SURGERY Right 2013  . CATARACT EXTRACTION W/PHACO Right 04/23/2016   Procedure: CATARACT EXTRACTION PHACO AND INTRAOCULAR LENS PLACEMENT ([place]) RIGHT EYE;  Surgeon: Leandrew Koyanagi, MD;  Location: [facility];  Service: Ophthalmology;  Laterality: Right;  . CATARACT EXTRACTION W/PHACO Left 06/04/2016   Procedure: CATARACT EXTRACTION PHACO AND INTRAOCULAR LENS PLACEMENT ([facility]);  Surgeon: Leandrew Koyanagi, MD;  Location: [facility];  Service: Ophthalmology;  Laterality: Left;  . [place]  . COLONOSCOPY WITH PROPOFOL N/A 07/22/2017   Procedure: COLONOSCOPY WITH PROPOFOL;  Surgeon: Robert Bellow, MD;  Location: [facility];  Service: Endoscopy;  Laterality: N/A;  . FRACTURE  SURGERY Left 2011   arm, plate and 12 screws  . LEG SURGERY Right 01/02/2015  . MASTECTOMY Right 2013  . Right inguinal hernia repair  2013  . VEIN SURGERY Left 2015    Family History  Problem Relation Age of Onset  . Dementia Mother   . Stroke Father   . Diabetes Daughter   . Hypertension Daughter   . Breast cancer Neg Hx     Social History:  reports that she has never smoked. She has never used smokeless tobacco. She reports that she does not drink alcohol or use drugs.  She drinks 4-5 beers/day.  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  She lives alone in [place].  The patient is alone today.  Allergies:  Allergies  Allergen Reactions  . Codeine Nausea Only    Current Medications: Current Outpatient Prescriptions  Medication Sig Dispense Refill  . alendronate (FOSAMAX) 70 MG tablet Take 1 tablet (70 mg total) by mouth once a week. 12 tablet 2  . aspirin EC 81 MG tablet Take 81 mg by mouth daily.    . calcium carbonate (OS-CAL) 600 MG TABS Take 600 mg by mouth 2 (two) times daily with a meal.    . citalopram (CELEXA) 20 MG tablet Take 1 tablet (20 mg total) by mouth daily. 90 tablet 0  . letrozole (FEMARA) 2.5 MG tablet Take 1 tablet (2.5 mg total) by mouth daily. 90 tablet 1  . Vitamin D, Ergocalciferol, (DRISDOL) 50000 units CAPS capsule  Take 1 capsule (50,000 Units total) by mouth every 7 (seven) days. 12 capsule 0   No current facility-administered medications for this visit.     Review of Systems:  GENERAL:  Feels "fine".  No fevers or sweats.  Weight stable. PERFORMANCE STATUS (ECOG): 0 HEENT:  No visual changes, runny nose, sore throat, mouth sores or tenderness. Lungs: No shortness of breath or cough.  No hemoptysis. Cardiac:  No chest pain, palpitations, orthopnea, or PND. GI:  No nausea, vomiting, diarrhea, constipation, melena or hematochezia.  Never had a colonscopy. GU:  No urgency, frequency, dysuria, or  hematuria. Musculoskeletal:  No back pain.  No joint pain.  No muscle tenderness. Extremities:  No pain or swelling. Skin:  No rashes or skin changes. Neuro:  No headache, numbness or weakness, balance or coordination issues. Endocrine:  No diabetes, thyroid issues, hot flashes or night sweats. Psych:  No mood changes, depression or anxiety.  Worries. Pain:  No focal pain. Review of systems:  All other systems reviewed and found to be negative.  Physical Exam: Blood pressure 114/68, pulse 70, temperature (!) 97.4 F (36.3 C), temperature source Tympanic, resp. rate 18, weight 141 lb 4 oz (64.1 kg). GENERAL:  Well developed, well nourished, woman sitting comfortably in the exam room in no acute distress.  She has a cane at her side. MENTAL STATUS:  Alert and oriented to person, place and time. HEAD:  Curly gray hair.  Normocephalic, atraumatic, face symmetric, no Cushingoid features. EYES:  Brown eyes.  Pupils equal round and reactive to light and accomodation.  No conjunctivitis or scleral icterus. ENT:  Oropharynx clear without lesion.  Tongue normal. Mucous membranes moist.  RESPIRATORY:  Clear to auscultation without rales, wheezes or rhonchi. CARDIOVASCULAR:  Regular rate and rhythm without murmur, rub or gallop. BREAST:  Right mastectomy without erythema or nodularity.  Left breast with medial fibrocystic changes (no change).  No discrete masses, skin changes or nipple discharge.  ABDOMEN:  Soft, non-tender, with active bowel sounds, and no hepatosplenomegaly.  No masses. SKIN:  No rashes, ulcers or lesions. EXTREMITIES: No edema, no skin discoloration or tenderness.  No palpable cords. LYMPH NODES: No palpable cervical, supraclavicular, axillary or inguinal adenopathy  NEUROLOGICAL: Unremarkable. PSYCH:  Appropriate.   Appointment on 07/28/2017  Component Date Value Ref Range Status  . Sodium 07/28/2017 137  135 - 145 mmol/L Final  . Potassium 07/28/2017 4.7  3.5 - 5.1 mmol/L  Final  . Chloride 07/28/2017 104  101 - 111 mmol/L Final  . CO2 07/28/2017 26  22 - 32 mmol/L Final  . Glucose, Bld 07/28/2017 98  65 - 99 mg/dL Final  . BUN 07/28/2017 14  6 - 20 mg/dL Final  . Creatinine, Ser 07/28/2017 0.61  0.44 - 1.00 mg/dL Final  . Calcium 07/28/2017 9.3  8.9 - 10.3 mg/dL Final  . Total Protein 07/28/2017 7.3  6.5 - 8.1 g/dL Final  . Albumin 07/28/2017 4.3  3.5 - 5.0 g/dL Final  . AST 07/28/2017 20  15 - 41 U/L Final  . ALT 07/28/2017 14  14 - 54 U/L Final  . Alkaline Phosphatase 07/28/2017 41  38 - 126 U/L Final  . Total Bilirubin 07/28/2017 0.7  0.3 - 1.2 mg/dL Final  . GFR calc non Af Amer 07/28/2017 >60  >60 mL/min Final  . GFR calc Af Amer 07/28/2017 >60  >60 mL/min Final   Comment: (NOTE) The eGFR has been calculated using the CKD EPI equation. This calculation has  not been validated in all clinical situations. eGFR's persistently <60 mL/min signify possible Chronic Kidney Disease.   . Anion gap 07/28/2017 7  5 - 15 Final  . WBC 07/28/2017 5.9  3.6 - 11.0 K/uL Final  . RBC 07/28/2017 4.43  3.80 - 5.20 MIL/uL Final  . Hemoglobin 07/28/2017 13.3  12.0 - 16.0 g/dL Final  . HCT 07/28/2017 39.0  35.0 - 47.0 % Final  . MCV 07/28/2017 88.0  80.0 - 100.0 fL Final  . MCH 07/28/2017 29.9  26.0 - 34.0 pg Final  . MCHC 07/28/2017 34.0  32.0 - 36.0 g/dL Final  . RDW 07/28/2017 13.2  11.5 - 14.5 % Final  . Platelets 07/28/2017 224  150 - 440 K/uL Final  . Neutrophils Relative % 07/28/2017 63  % Final  . Neutro Abs 07/28/2017 3.7  1.4 - 6.5 K/uL Final  . Lymphocytes Relative 07/28/2017 23  % Final  . Lymphs Abs 07/28/2017 1.4  1.0 - 3.6 K/uL Final  . Monocytes Relative 07/28/2017 10  % Final  . Monocytes Absolute 07/28/2017 0.6  0.2 - 0.9 K/uL Final  . Eosinophils Relative 07/28/2017 3  % Final  . Eosinophils Absolute 07/28/2017 0.1  0 - 0.7 K/uL Final  . Basophils Relative 07/28/2017 1  % Final  . Basophils Absolute 07/28/2017 0.1  0 - 0.1 K/uL Final     Assessment:  MERRILYN LEGLER is a 76 y.o. female with stage IA right breast cancer s/p right modified radical mastectomy with sentinel lymph node biopsy on 05/27/2012.  Pathology revealed a 1.8 cm grade I invasive carcinoma.  There was angiolymphatic invasion.  There was low grade DCIS.  One sentinel node and 5 non-sentinel nodes were removed.  Zero of 6 nodes were positive.  Tumor was ER + (90%).  PR + (90%).  HER-2 receptor - by FISH.  Pathologic stage was T1cN0M0 breast cancer.  Oncotype DX score was 10 which translated into a 7% chance of recurrent disease in 10 years of tamoxifen.  She began Femara in 07/2012.  Left mammogram on 05/13/2017 revealed no evidence of malignancy.  CA27.29 has been followed:  14.3 on 02/02/2013, 13.9 on 08/11/2013, 10.1 on 02/20/2014, 14.3 on 08/22/2014, 5.6 on 07/01/2016, 14.0 on 01/19/2017, and 8.0 on 07/28/2017.  She was admitted with a right femur fracture s/p fall in 12/2014.  She was on [place], calcium and vitamin D.  She has been taking Fosamax x 2 years.  Bone density on 08/08/2014 revealed osteoporosis with a T score of -2.9 in the left femur, -1.6 in the left forearm radius.  Bone density on 05/12/2016 revealed osteoporosis with a T score of -2.7 in the left femur, -1.4 in the left forearm radius.  Colonoscopy on 07/22/2017 revealed diverticulosis in the sigmoid colon and one 5 mm polyp in the rectum.  Pathology revealed a tubular adenoma.  Symptomatically, she feels good.  Exam reveals fibrocystic changes in the LEFT breast.  Labs today are unremarkable.   Plan: 1.  Labs today: CBC with diff, CMP, CA27.29. 2.  Review interval left mammogram- no evidence of malignancy. 3.  Discuss BCI testing (5 versus 10 years of adjuvant hormonal therapy).  Patient interested, however did not follow up with her insurance company. Patient concerned about the cost of this testing. She plans to call her insurance company and let us know.  4.  Continue Femara as  previously prescribed. Patient has been on this medication for 5 years. She is still deciding about the  BCI testing. We discussed the risk versus benefit of continuing aromatase inhibitor therapy. Patient already has osteoporosis. Her T score was -2.7 in the left femur back in 2017. Patient is on oral bisphosphonate (Fosamax). We briefly discussed the potential switch to Prolia if her next bone density demonstrates worsened bone thinning.  5.  RTC in 6 months for MD assessment and labs (CBC with diff, CMP, CA27.29).   Honor Loh, NP  07/28/2017, 11:59 AM   I saw and evaluated the patient, participating in the key portions of the service and reviewing pertinent diagnostic studies and records.  I reviewed the nurse practitioner's note and agree with the findings and the plan.  The assessment and plan were discussed with the patient.  A few questions were asked by the patient and answered.   Nolon Stalls, MD 07/28/2017, 11:59 AM

## 2017-07-29 LAB — CANCER ANTIGEN 27.29: CA 27.29: 8 U/mL (ref 0.0–38.6)

## 2017-10-07 ENCOUNTER — Ambulatory Visit: Payer: Medicare Other | Admitting: Family Medicine

## 2017-12-31 ENCOUNTER — Emergency Department
Admission: EM | Admit: 2017-12-31 | Discharge: 2017-12-31 | Disposition: A | Discharge status: Home / Self Care | Payer: Medicare Other | Attending: Emergency Medicine | Admitting: Emergency Medicine

## 2017-12-31 ENCOUNTER — Emergency Department: Payer: Medicare Other

## 2017-12-31 ENCOUNTER — Other Ambulatory Visit: Payer: Self-pay

## 2017-12-31 ENCOUNTER — Encounter: Payer: Self-pay | Admitting: Emergency Medicine

## 2017-12-31 DIAGNOSIS — Z79899 Other long term (current) drug therapy: Secondary | ICD-10-CM | POA: Insufficient documentation

## 2017-12-31 DIAGNOSIS — Z7982 Long term (current) use of aspirin: Secondary | ICD-10-CM | POA: Diagnosis not present

## 2017-12-31 DIAGNOSIS — J40 Bronchitis, not specified as acute or chronic: Secondary | ICD-10-CM | POA: Insufficient documentation

## 2017-12-31 DIAGNOSIS — R05 Cough: Secondary | ICD-10-CM

## 2017-12-31 DIAGNOSIS — R059 Cough, unspecified: Secondary | ICD-10-CM

## 2017-12-31 LAB — COMPREHENSIVE METABOLIC PANEL
ALBUMIN: 4.5 g/dL (ref 3.5–5.0)
ALK PHOS: 56 U/L (ref 38–126)
ALT: 16 U/L (ref 14–54)
AST: 23 U/L (ref 15–41)
Anion gap: 12 (ref 5–15)
BUN: 12 mg/dL (ref 6–20)
CALCIUM: 9.3 mg/dL (ref 8.9–10.3)
CO2: 20 mmol/L — AB (ref 22–32)
CREATININE: 0.75 mg/dL (ref 0.44–1.00)
Chloride: 101 mmol/L (ref 101–111)
GFR calc Af Amer: >60 mL/min (ref >60)
GFR calc non Af Amer: >60 mL/min (ref >60)
GLUCOSE: 108 mg/dL — AB (ref 65–99)
Potassium: 3.7 mmol/L (ref 3.5–5.1)
SODIUM: 133 mmol/L — AB (ref 135–145)
Total Bilirubin: 0.6 mg/dL (ref 0.3–1.2)
Total Protein: 7.9 g/dL (ref 6.5–8.1)

## 2017-12-31 LAB — URINALYSIS, COMPLETE (UACMP) WITH MICROSCOPIC
BACTERIA UA: NONE SEEN
Glucose, UA: NEGATIVE mg/dL
Ketones, ur: 40 mg/dL — AB
Leukocytes, UA: NEGATIVE
NITRITE: NEGATIVE
Protein, ur: NEGATIVE mg/dL
SPECIFIC GRAVITY, URINE: 1.02 (ref 1.005–1.030)
Squamous Epithelial / LPF: NONE SEEN
pH: 5.5 (ref 5.0–8.0)

## 2017-12-31 LAB — CBC WITH DIFFERENTIAL/PLATELET
BASOS PCT: 0 %
Basophils Absolute: 0 10*3/uL (ref 0–0.1)
EOS ABS: 0 10*3/uL (ref 0–0.7)
EOS PCT: 0 %
HEMATOCRIT: 43.8 % (ref 35.0–47.0)
Hemoglobin: 14.5 g/dL (ref 12.0–16.0)
Lymphocytes Relative: 11 %
Lymphs Abs: 0.9 10*3/uL — ABNORMAL LOW (ref 1.0–3.6)
MCH: 29 pg (ref 26.0–34.0)
MCHC: 33 g/dL (ref 32.0–36.0)
MCV: 87.9 fL (ref 80.0–100.0)
MONO ABS: 1.1 10*3/uL — AB (ref 0.2–0.9)
MONOS PCT: 14 %
Neutro Abs: 6.1 10*3/uL (ref 1.4–6.5)
Neutrophils Relative %: 75 %
Platelets: 246 10*3/uL (ref 150–440)
RBC: 4.98 MIL/uL (ref 3.80–5.20)
RDW: 14.3 % (ref 11.5–14.5)
WBC: 8.2 10*3/uL (ref 3.6–11.0)

## 2017-12-31 MED ORDER — LIDOCAINE HCL (PF) 1 % IJ SOLN
5.0000 mL | Freq: Once | INTRAMUSCULAR | Status: AC
  Administered 2017-12-31: 5 mL
  Filled 2017-12-31: qty 5

## 2017-12-31 MED ORDER — IPRATROPIUM-ALBUTEROL 0.5-2.5 (3) MG/3ML IN SOLN
3.0000 mL | Freq: Once | RESPIRATORY_TRACT | Status: AC
  Administered 2017-12-31: 3 mL via RESPIRATORY_TRACT
  Filled 2017-12-31: qty 3

## 2017-12-31 MED ORDER — BENZONATATE 100 MG PO CAPS: 100.0000 mg | ORAL_CAPSULE | Freq: Four times a day (QID) | ORAL | 0 refills | Status: DC | PRN

## 2017-12-31 MED ORDER — IPRATROPIUM BROMIDE 0.06 % NA SOLN: 2.0000 | Freq: Three times a day (TID) | NASAL | 0 refills | Status: DC

## 2017-12-31 MED ORDER — ALBUTEROL SULFATE HFA 108 (90 BASE) MCG/ACT IN AERS: 2.0000 | INHALATION_SPRAY | Freq: Four times a day (QID) | RESPIRATORY_TRACT | 0 refills | Status: DC | PRN

## 2017-12-31 NOTE — ED Triage Notes (Addendum)
Here for headache and general malaise.  C/o joint pain but not body aches.  Has had flu before and reports this does not feel same. Chills at times but no fevers known.  Has had cough. No SHOB/CP.  Has had some chest wall pain when coughing only.  Ambulatory to triage without assistance. NAD. VSS. Unlabored. Sx started yesterday per pt but cough has been X 1 month with clear sputum

## 2017-12-31 NOTE — ED Provider Notes (Signed)
[facility] Emergency Department Provider Note  ____________________________________________   First MD Initiated Contact with Patient 12/31/17 1624     (approximate)  I have reviewed the triage vital signs and the nursing notes.   HISTORY  Chief Complaint Headache   HPI Rebecca Faulkner is a 77 y.o. female is self presents to the emergency department with multiple complaints.  She reports headache generalized malaise dry cough and low-grade fever at home.  She has mild to moderate upper chest wall pain sharp nonradiating worse when coughing improved when not coughing.  She denies sore throat.  Her symptoms worsened yesterday but have persisted for the past month or so when she initially was sick.  Nothing seems to make her symptoms better or worse.  Past Medical History:  Diagnosis Date  . Breast cancer ([place]) 2013   right breast  . Heart murmur 1990  . Hypercholesteremia   . Lump or mass in breast   . Malignant neoplasm of upper-outer quadrant of female breast ([place]) 2013   R-breast T1, NO, MO, ER/PR pos., Her 2 neg.  . Osteoporosis   . Ulcer 1974    Patient Active Problem List   Diagnosis Date Noted  . Osteoporosis without current pathological fracture 07/28/2017  . Depression, major, recurrent, mild ([place]) 07/07/2017  . B12 deficiency 07/07/2017  . Vitamin D deficiency 07/07/2017  . Screening for colon cancer 05/27/2017  . Malignant neoplasm of upper-outer quadrant of right breast in female, estrogen receptor positive ([place]) 05/27/2017  . Glaucoma 05/26/2017  . History of alcoholism ([place]) 05/26/2017  . History of hip fracture 05/10/2015  . Breast cancer, right ([place]) 05/27/2012  . Estrogen receptor positive status (ER+) 05/14/2012    Past Surgical History:  Procedure Laterality Date  . BREAST SURGERY Right 2013  . CATARACT EXTRACTION W/PHACO Right 04/23/2016   Procedure: CATARACT EXTRACTION PHACO AND INTRAOCULAR LENS PLACEMENT ([place]) RIGHT EYE;   Surgeon: Leandrew Koyanagi, MD;  Location: [place];  Service: Ophthalmology;  Laterality: Right;  . CATARACT EXTRACTION W/PHACO Left 06/04/2016   Procedure: CATARACT EXTRACTION PHACO AND INTRAOCULAR LENS PLACEMENT ([facility]);  Surgeon: Leandrew Koyanagi, MD;  Location: [place];  Service: Ophthalmology;  Laterality: Left;  . [place]  . COLONOSCOPY WITH PROPOFOL N/A 07/22/2017   Procedure: COLONOSCOPY WITH PROPOFOL;  Surgeon: Robert Bellow, MD;  Location: [facility] ENDOSCOPY;  Service: Endoscopy;  Laterality: N/A;  . FRACTURE SURGERY Left 2011   arm, plate and 12 screws  . LEG SURGERY Right 01/02/2015  . MASTECTOMY Right 2013  . Right inguinal hernia repair  2013  . VEIN SURGERY Left 2015    Prior to Admission medications   Medication Sig Start Date End Date Taking? Authorizing Provider  albuterol (PROVENTIL HFA;VENTOLIN HFA) 108 (90 Base) MCG/ACT inhaler Inhale 2 puffs into the lungs every 6 (six) hours as needed for wheezing or shortness of breath. 12/31/17   Darel Hong, MD  alendronate (FOSAMAX) 70 MG tablet Take 1 tablet (70 mg total) by mouth once a week. 02/12/17   Lequita Asal, MD  aspirin EC 81 MG tablet Take 81 mg by mouth daily.    [provider]  benzonatate (TESSALON PERLES) 100 MG capsule Take 1 capsule (100 mg total) by mouth every 6 (six) hours as needed for cough. 12/31/17 12/31/18  Darel Hong, MD  calcium carbonate (OS-CAL) 600 MG TABS Take 600 mg by mouth 2 (two) times daily with a meal.    [provider]  citalopram (CELEXA) 20 MG tablet Take 1 tablet (20 mg total) by mouth daily. 07/10/17   Steele Sizer, MD  ipratropium (ATROVENT) 0.06 % nasal spray Place 2 sprays into the nose 3 (three) times daily. 12/31/17 12/31/18  Darel Hong, MD  letrozole [facility]) 2.5 MG tablet Take 1 tablet (2.5 mg total) by mouth daily. 02/12/17   Lequita Asal, MD  Vitamin D, Ergocalciferol, (DRISDOL) 50000 units CAPS capsule  Take 1 capsule (50,000 Units total) by mouth every 7 (seven) days. 07/10/17   Steele Sizer, MD    Allergies Codeine  Family History  Problem Relation Age of Onset  . Dementia Mother   . Stroke Father   . Diabetes Daughter   . Hypertension Daughter   . Breast cancer Neg Hx     Social History Social History   Tobacco Use  . Smoking status: Never Smoker  . Smokeless tobacco: Never Used  Substance Use Topics  . Alcohol use: No    Comment: Quit drinking  11/04/2016  . Drug use: No    Review of Systems Constitutional: Positive for fever Eyes: No visual changes. ENT: No sore throat. Cardiovascular: Positive for chest pain. Respiratory: Denies shortness of breath. Gastrointestinal: No abdominal pain.  No nausea, no vomiting.  No diarrhea.  No constipation. Genitourinary: Negative for dysuria. Musculoskeletal: Negative for back pain. Skin: Negative for rash. Neurological: Positive for headache   ____________________________________________   PHYSICAL EXAM:  VITAL SIGNS: ED Triage Vitals  Enc Vitals Group     BP 12/31/17 1604 (!) 142/72     Pulse Rate 12/31/17 1604 98     Resp 12/31/17 1604 20     Temp 12/31/17 1604 97.7 F (36.5 C)     Temp Source 12/31/17 1604 Oral     SpO2 12/31/17 1604 100 %     Weight 12/31/17 1604 140 lb (63.5 kg)     Height 12/31/17 1604 5\' 2"  (1.575 m)     Head Circumference --      Peak Flow --      Pain Score 12/31/17 1602 4     Pain Loc --      Pain Edu? --      Excl. in [place]? --     Constitutional: Alert and oriented x4 appears mildly uncomfortable nontoxic no diaphoresis speaks in full clear sentences Eyes: PERRL EOMI. Head: Atraumatic. Nose: No congestion/rhinnorhea. Mouth/Throat: No trismus Neck: No stridor.  No meningismus Cardiovascular: Normal rate, regular rhythm. Grossly normal heart sounds.  Good peripheral circulation. Respiratory: Mildly increased respiratory effort with faint wheeze throughout Gastrointestinal:  Soft nontender Musculoskeletal: No lower extremity edema   Neurologic:  Normal speech and language. No gross focal neurologic deficits are appreciated. Skin:  Skin is warm, dry and intact. No rash noted. Psychiatric: Mood and affect are normal. Speech and behavior are normal.    ____________________________________________   DIFFERENTIAL includes but not limited to  Post viral syndrome, bronchitis, pneumonia, pneumothorax, pulmonary embolism ____________________________________________   LABS (all labs ordered are listed, but only abnormal results are displayed)  Labs Reviewed  COMPREHENSIVE METABOLIC PANEL - Abnormal; Notable for the following components:      Result Value   Sodium 133 (*)    CO2 20 (*)    Glucose, Bld 108 (*)    All other components within normal limits  CBC WITH DIFFERENTIAL/PLATELET - Abnormal; Notable for the following components:   Lymphs Abs 0.9 (*)    Monocytes Absolute 1.1 (*)  All other components within normal limits  URINALYSIS, COMPLETE (UACMP) WITH MICROSCOPIC - Abnormal; Notable for the following components:   Hgb urine dipstick SMALL (*)    Bilirubin Urine SMALL (*)    Ketones, ur 40 (*)    All other components within normal limits    Lab work reviewed by me with no acute disease __________________________________________  EKG    ____________________________________________  RADIOLOGY  Chest x-ray reviewed by me with chronic bronchitic changes but no acute disease ____________________________________________   PROCEDURES  Procedure(s) performed: no  Procedures  Critical Care performed: no  Observation: no ____________________________________________   INITIAL IMPRESSION / ASSESSMENT AND PLAN / ED COURSE  Pertinent labs & imaging results that were available during my care of the patient were reviewed by me and considered in my medical decision making (see chart for details).  The patient arrives with roughly 1 month  of constant symptoms.  She is mildly wheezy with a chest x-ray concerning for mild bronchitic changes.  Her symptoms are most consistent with post viral syndrome.  I nebulized DuoNeb along with 5 cc of 1% lidocaine with complete resolution of the patient's cough.  The patient's primary concern is her persistent cough.  Will prescribe Tessalon Perles along with albuterol and Atrovent nasal spray to help with her symptomatic control.  No indication for antibiotics at this time.  Strict return precautions have been given and the patient verbalizes understanding and agreement with the plan.      ____________________________________________   FINAL CLINICAL IMPRESSION(S) / ED DIAGNOSES  Final diagnoses:  Bronchitis  Cough      NEW MEDICATIONS STARTED DURING THIS VISIT:  Discharge Medication List as of 12/31/2017  5:50 PM    START taking these medications   Details  albuterol (PROVENTIL HFA;VENTOLIN HFA) 108 (90 Base) MCG/ACT inhaler Inhale 2 puffs into the lungs every 6 (six) hours as needed for wheezing or shortness of breath., Starting Thu 12/31/2017, Print    benzonatate (TESSALON PERLES) 100 MG capsule Take 1 capsule (100 mg total) by mouth every 6 (six) hours as needed for cough., Starting Thu 12/31/2017, Until Fri 12/31/2018, Print    ipratropium (ATROVENT) 0.06 % nasal spray Place 2 sprays into the nose 3 (three) times daily., Starting Thu 12/31/2017, Until Fri 12/31/2018, Print         Note:  This document was prepared using Dragon voice recognition software and may include unintentional dictation errors.     Darel Hong, MD 01/01/18 [phone]

## 2017-12-31 NOTE — Discharge Instructions (Addendum)
Fortunately today your blood work and your chest x-ray were reassuring.  Please use your inhaler as needed for severe symptoms and follow-up with your primary care physician as needed.  Return to the emergency department for any concerns.  It was a pleasure to take care of you today, and thank you for coming to our emergency department.  If you have any questions or concerns before leaving please ask the nurse to grab me and I'm more than happy to go through your aftercare instructions again.  If you were prescribed any opioid pain medication today such as Norco, Vicodin, Percocet, morphine, hydrocodone, or oxycodone please make sure you do not drive when you are taking this medication as it can alter your ability to drive safely.  If you have any concerns once you are home that you are not improving or are in fact getting worse before you can make it to your follow-up appointment, please do not hesitate to call [phone] and come back for further evaluation.  Darel Hong, MD  Results for orders placed or performed during the hospital encounter of 12/31/17  Comprehensive metabolic panel  Result Value Ref Range   Sodium 133 (L) 135 - 145 mmol/L   Potassium 3.7 3.5 - 5.1 mmol/L   Chloride 101 101 - 111 mmol/L   CO2 20 (L) 22 - 32 mmol/L   Glucose, Bld 108 (H) 65 - 99 mg/dL   BUN 12 6 - 20 mg/dL   Creatinine, Ser 0.75 0.44 - 1.00 mg/dL   Calcium 9.3 8.9 - 10.3 mg/dL   Total Protein 7.9 6.5 - 8.1 g/dL   Albumin 4.5 3.5 - 5.0 g/dL   AST 23 15 - 41 U/L   ALT 16 14 - 54 U/L   Alkaline Phosphatase 56 38 - 126 U/L   Total Bilirubin 0.6 0.3 - 1.2 mg/dL   GFR calc non Af Amer >60 >60 mL/min   GFR calc Af Amer >60 >60 mL/min   Anion gap 12 5 - 15  CBC with Differential  Result Value Ref Range   WBC 8.2 3.6 - 11.0 K/uL   RBC 4.98 3.80 - 5.20 MIL/uL   Hemoglobin 14.5 12.0 - 16.0 g/dL   HCT 43.8 35.0 - 47.0 %   MCV 87.9 80.0 - 100.0 fL   MCH 29.0 26.0 - 34.0 pg   MCHC 33.0 32.0 - 36.0 g/dL   RDW  14.3 11.5 - 14.5 %   Platelets 246 150 - 440 K/uL   Neutrophils Relative % 75 %   Neutro Abs 6.1 1.4 - 6.5 K/uL   Lymphocytes Relative 11 %   Lymphs Abs 0.9 (L) 1.0 - 3.6 K/uL   Monocytes Relative 14 %   Monocytes Absolute 1.1 (H) 0.2 - 0.9 K/uL   Eosinophils Relative 0 %   Eosinophils Absolute 0.0 0 - 0.7 K/uL   Basophils Relative 0 %   Basophils Absolute 0.0 0 - 0.1 K/uL  Urinalysis, Complete w Microscopic  Result Value Ref Range   Color, Urine YELLOW YELLOW   APPearance CLEAR CLEAR   Specific Gravity, Urine 1.020 1.005 - 1.030   pH 5.5 5.0 - 8.0   Glucose, UA NEGATIVE NEGATIVE mg/dL   Hgb urine dipstick SMALL (A) NEGATIVE   Bilirubin Urine SMALL (A) NEGATIVE   Ketones, ur 40 (A) NEGATIVE mg/dL   Protein, ur NEGATIVE NEGATIVE mg/dL   Nitrite NEGATIVE NEGATIVE   Leukocytes, UA NEGATIVE NEGATIVE   RBC / HPF 0-5 0 - 5 RBC/hpf  WBC, UA 0-5 0 - 5 WBC/hpf   Bacteria, UA NONE SEEN NONE SEEN   Squamous Epithelial / LPF NONE SEEN NONE SEEN   Mucus PRESENT    Dg Chest 2 View  Result Date: 12/31/2017 CLINICAL DATA:  77 y/o F; weakness, nausea, productive cough for 1 month. History of right breast cancer. EXAM: CHEST - 2 VIEW COMPARISON:  01/02/2015 chest radiograph FINDINGS: Stable borderline cardiomegaly. Aortic atherosclerosis with calcification. No consolidation, effusion, or pneumothorax. Plate fixation of left proximal humerus is partially visualized. No acute osseous abnormality identified. Stable mild biapical pleuroparenchymal scarring. Reticular opacities greatest in lung bases probably represent bronchitic changes and are similar to prior chest radiograph. IMPRESSION: Chronic bronchitic changes. Borderline cardiomegaly. Aortic atherosclerosis. Electronically Signed   By: Kristine Garbe M.D.   On: 12/31/2017 16:41

## 2018-01-20 ENCOUNTER — Telehealth: Payer: Self-pay | Admitting: *Deleted

## 2018-01-20 NOTE — Telephone Encounter (Signed)
Called patient back after she called about a "breast test".  Patient was unavailable on her cell phone so I called and LVM on her home phone to call me back so we can discuss what she is inquiring about. In the last MD progress note it was noted that BCI testing had been discussed with the patient and she was going to discuss with her insurance company and call us back.  I will await her call.  Patient is scheduled for Tuesday, April 2.

## 2018-01-26 ENCOUNTER — Inpatient Hospital Stay: Payer: Medicare Other | Attending: Hematology and Oncology

## 2018-01-26 ENCOUNTER — Encounter: Payer: Self-pay | Admitting: Hematology and Oncology

## 2018-01-26 ENCOUNTER — Inpatient Hospital Stay (HOSPITAL_BASED_OUTPATIENT_CLINIC_OR_DEPARTMENT_OTHER): Payer: Medicare Other | Admitting: Hematology and Oncology

## 2018-01-26 VITALS — BP 132/84 | HR 80 | Temp 97.5°F | Wt 146.5 lb

## 2018-01-26 DIAGNOSIS — M810 Age-related osteoporosis without current pathological fracture: Secondary | ICD-10-CM | POA: Diagnosis not present

## 2018-01-26 DIAGNOSIS — Z79811 Long term (current) use of aromatase inhibitors: Secondary | ICD-10-CM | POA: Diagnosis not present

## 2018-01-26 DIAGNOSIS — Z9011 Acquired absence of right breast and nipple: Secondary | ICD-10-CM | POA: Diagnosis not present

## 2018-01-26 DIAGNOSIS — Z170 Estrogen receptor positive status [ER+]: Secondary | ICD-10-CM | POA: Diagnosis not present

## 2018-01-26 DIAGNOSIS — C50911 Malignant neoplasm of unspecified site of right female breast: Secondary | ICD-10-CM

## 2018-01-26 DIAGNOSIS — C50411 Malignant neoplasm of upper-outer quadrant of right female breast: Secondary | ICD-10-CM | POA: Insufficient documentation

## 2018-01-26 DIAGNOSIS — Z853 Personal history of malignant neoplasm of breast: Secondary | ICD-10-CM

## 2018-01-26 DIAGNOSIS — Z7189 Other specified counseling: Secondary | ICD-10-CM

## 2018-01-26 LAB — COMPREHENSIVE METABOLIC PANEL
ALT: 15 U/L (ref 14–54)
AST: 24 U/L (ref 15–41)
Albumin: 4.2 g/dL (ref 3.5–5.0)
Alkaline Phosphatase: 57 U/L (ref 38–126)
Anion gap: 10 (ref 5–15)
BUN: 11 mg/dL (ref 6–20)
CO2: 24 mmol/L (ref 22–32)
Calcium: 9.7 mg/dL (ref 8.9–10.3)
Chloride: 106 mmol/L (ref 101–111)
Creatinine, Ser: 0.6 mg/dL (ref 0.44–1.00)
GFR calc Af Amer: >60 mL/min (ref >60)
GFR calc non Af Amer: >60 mL/min (ref >60)
Glucose, Bld: 103 mg/dL — ABNORMAL HIGH (ref 65–99)
Potassium: 4.3 mmol/L (ref 3.5–5.1)
Sodium: 140 mmol/L (ref 135–145)
Total Bilirubin: 0.7 mg/dL (ref 0.3–1.2)
Total Protein: 7.5 g/dL (ref 6.5–8.1)

## 2018-01-26 LAB — CBC WITH DIFFERENTIAL/PLATELET
Basophils Absolute: 0.1 10*3/uL (ref 0–0.1)
Basophils Relative: 1 %
Eosinophils Absolute: 0.1 10*3/uL (ref 0–0.7)
Eosinophils Relative: 2 %
HCT: 42.5 % (ref 35.0–47.0)
Hemoglobin: 14.5 g/dL (ref 12.0–16.0)
Lymphocytes Relative: 23 %
Lymphs Abs: 1.6 10*3/uL (ref 1.0–3.6)
MCH: 30.1 pg (ref 26.0–34.0)
MCHC: 34 g/dL (ref 32.0–36.0)
MCV: 88.5 fL (ref 80.0–100.0)
Monocytes Absolute: 0.7 10*3/uL (ref 0.2–0.9)
Monocytes Relative: 10 %
Neutro Abs: 4.3 10*3/uL (ref 1.4–6.5)
Neutrophils Relative %: 64 %
Platelets: 259 10*3/uL (ref 150–440)
RBC: 4.8 MIL/uL (ref 3.80–5.20)
RDW: 14.3 % (ref 11.5–14.5)
WBC: 6.7 10*3/uL (ref 3.6–11.0)

## 2018-01-26 MED ORDER — ALENDRONATE SODIUM 70 MG PO TABS: 70.0000 mg | ORAL_TABLET | ORAL | 2 refills | Status: DC

## 2018-01-26 NOTE — Progress Notes (Signed)
[facility] day:  01/26/2018  Chief Complaint: Rebecca Faulkner is a 77 y.o. female with stage IA right breast cancer who is seen for 6 month assessment.  HPI:   The patient was last seen in the medical oncology clinic on 07/28/2017.  At that time, she felt good.  Exam revealed fibrocystic changes in the LEFT breast.  Labs were unremarkable. She continued Femara.  We discussed BCI testing.   During the interim, patient is doing well today. There are no acute concerns. Patient denies B symptoms. Patient was treated with bronchitis and MDI for acute bronchitis. She does not verbalize any concerns related to her breast today. Patient has been "out" of her Femara for "a month to a month and a half". Patient notes that she did not call for a refill because she "knew that she would have to be seen".   Patient is eating well. Weight has increased by 5 pounds. Patient denies pain in the clinic today.    Past Medical History:  Diagnosis Date  . Breast cancer ([place]) 2013   right breast  . Heart murmur 1990  . Hypercholesteremia   . Lump or mass in breast   . Malignant neoplasm of upper-outer quadrant of female breast ([place]) 2013   R-breast T1, NO, MO, ER/PR pos., Her 2 neg.  . Osteoporosis   . Ulcer 1974    Past Surgical History:  Procedure Laterality Date  . BREAST SURGERY Right 2013  . CATARACT EXTRACTION W/PHACO Right 04/23/2016   Procedure: CATARACT EXTRACTION PHACO AND INTRAOCULAR LENS PLACEMENT ([place]) RIGHT EYE;  Surgeon: Leandrew Koyanagi, MD;  Location: [place];  Service: Ophthalmology;  Laterality: Right;  . CATARACT EXTRACTION W/PHACO Left 06/04/2016   Procedure: CATARACT EXTRACTION PHACO AND INTRAOCULAR LENS PLACEMENT ([facility]);  Surgeon: Leandrew Koyanagi, MD;  Location: [facility] [place];  Service: Ophthalmology;  Laterality: Left;  . [place]  . COLONOSCOPY WITH PROPOFOL N/A 07/22/2017   Procedure: COLONOSCOPY WITH  PROPOFOL;  Surgeon: Robert Bellow, MD;  Location: [facility];  Service: Endoscopy;  Laterality: N/A;  . FRACTURE SURGERY Left 2011   arm, plate and 12 screws  . LEG SURGERY Right 01/02/2015  . MASTECTOMY Right 2013  . Right inguinal hernia repair  2013  . VEIN SURGERY Left 2015    Family History  Problem Relation Age of Onset  . Dementia Mother   . Stroke Father   . Diabetes Daughter   . Hypertension Daughter   . Breast cancer Neg Hx     Social History:  reports that she has never smoked. She has never used smokeless tobacco. She reports that she does not drink alcohol or use drugs.  She drinks 4-5 beers/day.  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  She lives alone in [place].  The patient is alone today.  Allergies:  Allergies  Allergen Reactions  . Codeine Nausea Only    Current Medications: Current Outpatient Medications  Medication Sig Dispense Refill  . albuterol (PROVENTIL HFA;VENTOLIN HFA) 108 (90 Base) MCG/ACT inhaler Inhale 2 puffs into the lungs every 6 (six) hours as needed for wheezing or shortness of breath. 1 Inhaler 0  . citalopram (CELEXA) 20 MG tablet Take 1 tablet (20 mg total) by mouth daily. 90 tablet 0  . ipratropium (ATROVENT) 0.06 % nasal spray Place 2 sprays into the nose 3 (three) times daily. 15 mL 0  . letrozole [facility])  2.5 MG tablet Take 1 tablet (2.5 mg total) by mouth daily. 90 tablet 1  . alendronate (FOSAMAX) 70 MG tablet Take 1 tablet (70 mg total) by mouth once a week. (Patient not taking: Reported on 01/26/2018) 12 tablet 2  . aspirin EC 81 MG tablet Take 81 mg by mouth daily.    . benzonatate (TESSALON PERLES) 100 MG capsule Take 1 capsule (100 mg total) by mouth every 6 (six) hours as needed for cough. (Patient not taking: Reported on 01/26/2018) 30 capsule 0  . calcium carbonate (OS-CAL) 600 MG TABS Take 600 mg by mouth 2 (two) times daily with a meal.    . Vitamin D, Ergocalciferol, (DRISDOL) 50000 units  CAPS capsule Take 1 capsule (50,000 Units total) by mouth every 7 (seven) days. (Patient not taking: Reported on 01/26/2018) 12 capsule 0   No current facility-administered medications for this visit.     Review of Systems:  GENERAL:  Feels "good".  No fevers or sweats.  Weight up 5 pounds.  PERFORMANCE STATUS (ECOG): 0 HEENT:  No visual changes, runny nose, sore throat, mouth sores or tenderness. Lungs: No shortness of breath or cough.  No hemoptysis.  Interval bronchitis. Cardiac:  No chest pain, palpitations, orthopnea, or PND. GI:  No nausea, vomiting, diarrhea, constipation, melena or hematochezia.  Never had a colonscopy. GU:  No urgency, frequency, dysuria, or hematuria. Musculoskeletal:  No back pain.  No joint pain.  No muscle tenderness. Extremities:  No pain or swelling. Skin:  No rashes or skin changes. Neuro:  No headache, numbness or weakness, balance or coordination issues. Endocrine:  No diabetes, thyroid issues, hot flashes or night sweats. Psych:  No mood changes, depression or anxiety.  Worries. Pain:  No focal pain. Review of systems:  All other systems reviewed and found to be negative.  Physical Exam: Blood pressure 132/84, pulse 80, temperature (!) 97.5 F (36.4 C), temperature source Tympanic, weight 146 lb 8 oz (66.5 kg). GENERAL:  Well developed, well nourished, woman sitting comfortably in the exam room in no acute distress.  She has a cane at her side. MENTAL STATUS:  Alert and oriented to person, place and time. HEAD:  Curly gray hair.  Normocephalic, atraumatic, face symmetric, no Cushingoid features. EYES:  Brown eyes.  Pupils equal round and reactive to light and accomodation.  No conjunctivitis or scleral icterus. ENT:  Oropharynx clear without lesion.  Tongue normal. Mucous membranes moist.  RESPIRATORY:  Clear to auscultation without rales, wheezes or rhonchi. CARDIOVASCULAR:  Regular rate and rhythm without murmur, rub or gallop. BREAST:  Right  mastectomy without erythema or nodularity.  Left breast with medial fibrocystic changes (no change).  No discrete masses, skin changes or nipple discharge.  ABDOMEN:  Soft, non-tender, with active bowel sounds, and no hepatosplenomegaly.  No masses. SKIN:  No rashes, ulcers or lesions. EXTREMITIES: No edema, no skin discoloration or tenderness.  No palpable cords. LYMPH NODES: No palpable cervical, supraclavicular, axillary or inguinal adenopathy  NEUROLOGICAL: Unremarkable. PSYCH:  Appropriate.   Appointment on 01/26/2018  Component Date Value Ref Range Status  . Sodium 01/26/2018 140  135 - 145 mmol/L Final  . Potassium 01/26/2018 4.3  3.5 - 5.1 mmol/L Final  . Chloride 01/26/2018 106  101 - 111 mmol/L Final  . CO2 01/26/2018 24  22 - 32 mmol/L Final  . Glucose, Bld 01/26/2018 103* 65 - 99 mg/dL Final  . BUN 01/26/2018 11  6 - 20 mg/dL Final  . Creatinine, Ser  01/26/2018 0.60  0.44 - 1.00 mg/dL Final  . Calcium 01/26/2018 9.7  8.9 - 10.3 mg/dL Final  . Total Protein 01/26/2018 7.5  6.5 - 8.1 g/dL Final  . Albumin 01/26/2018 4.2  3.5 - 5.0 g/dL Final  . AST 01/26/2018 24  15 - 41 U/L Final  . ALT 01/26/2018 15  14 - 54 U/L Final  . Alkaline Phosphatase 01/26/2018 57  38 - 126 U/L Final  . Total Bilirubin 01/26/2018 0.7  0.3 - 1.2 mg/dL Final  . GFR calc non Af Amer 01/26/2018 >60  >60 mL/min Final  . GFR calc Af Amer 01/26/2018 >60  >60 mL/min Final   Comment: (NOTE) The eGFR has been calculated using the CKD EPI equation. This calculation has not been validated in all clinical situations. eGFR's persistently <60 mL/min signify possible Chronic Kidney Disease.   Georgiann Hahn gap 01/26/2018 10  5 - 15 Final   Performed at [facility], [address] [place], [place] [address]  . WBC 01/26/2018 6.7  3.6 - 11.0 K/uL Final  . RBC 01/26/2018 4.80  3.80 - 5.20 MIL/uL Final  . Hemoglobin 01/26/2018 14.5  12.0 - 16.0 g/dL Final  . HCT 01/26/2018 42.5  35.0 - 47.0 % Final  . MCV  01/26/2018 88.5  80.0 - 100.0 fL Final  . MCH 01/26/2018 30.1  26.0 - 34.0 pg Final  . MCHC 01/26/2018 34.0  32.0 - 36.0 g/dL Final  . RDW 01/26/2018 14.3  11.5 - 14.5 % Final  . Platelets 01/26/2018 259  150 - 440 K/uL Final  . Neutrophils Relative % 01/26/2018 64  % Final  . Neutro Abs 01/26/2018 4.3  1.4 - 6.5 K/uL Final  . Lymphocytes Relative 01/26/2018 23  % Final  . Lymphs Abs 01/26/2018 1.6  1.0 - 3.6 K/uL Final  . Monocytes Relative 01/26/2018 10  % Final  . Monocytes Absolute 01/26/2018 0.7  0.2 - 0.9 K/uL Final  . Eosinophils Relative 01/26/2018 2  % Final  . Eosinophils Absolute 01/26/2018 0.1  0 - 0.7 K/uL Final  . Basophils Relative 01/26/2018 1  % Final  . Basophils Absolute 01/26/2018 0.1  0 - 0.1 K/uL Final   Performed at [facility], [address] [place], [place] [address]    Assessment:  CRICKETT ABBETT is a 77 y.o. female with stage IA right breast cancer s/p right modified radical mastectomy with sentinel lymph node biopsy on 05/27/2012.  Pathology revealed a 1.8 cm grade I invasive carcinoma.  There was angiolymphatic invasion.  There was low grade DCIS.  One sentinel node and 5 non-sentinel nodes were removed.  Zero of 6 nodes were positive.  Tumor was ER + (90%).  PR + (90%).  HER-2 receptor - by FISH.  Pathologic stage was T1cN0M0 breast cancer.  Oncotype DX score was 10 which translated into a 7% chance of recurrent disease in 10 years of tamoxifen.  She began Femara in 07/2012.  Left mammogram on 05/13/2017 revealed no evidence of malignancy.  CA27.29 has been followed:  14.3 on 02/02/2013, 13.9 on 08/11/2013, 10.1 on 02/20/2014, 14.3 on 08/22/2014, 5.6 on 07/01/2016, 14.0 on 01/19/2017, 8.0 on 07/28/2017, and 11 on 01/26/2018.  She was admitted with a right femur fracture s/p fall in 12/2014.  She was on [place], calcium and vitamin D.  She is on Fosamax.  Bone density on 08/08/2014 revealed osteoporosis with a T score of -2.9 in the left femur,  -1.6 in the left  forearm radius.  Bone density on 05/12/2016 revealed osteoporosis with a T score of -2.7 in the left femur, -1.4 in the left forearm radius.  Colonoscopy on 07/22/2017 revealed diverticulosis in the sigmoid colon and one 5 mm polyp in the rectum.  Pathology revealed a tubular adenoma.  Symptomatically, she feels good.  She has no acute concerns. Exam reveals fibrocystic changes in the LEFT breast.  Labs are unremarkable.   Plan: 1.  Labs today: CBC with diff, CMP, CA27.29. 2.  Discuss BCI testing. 3.  Bone density study on 05/12/2018. 4.  Schedule mammogram on 05/12/2018 5.  Discuss BCI testing (5 versus 10 years of adjuvant hormonal therapy).  Patient interested, however notes that her insurance will not cover. Patient concerned about the cost of this testing.  Contact [employer] at patient's request [phone]). 6.  Continue Femara as previously prescribed. Hold on refill until we determine need for extended hormonal therapy. Patient has been on hormonal therapy for 5 years. We discussed the risk versus benefit of continuing aromatase inhibitor therapy. Patient already has osteoporosis. Her T score was -2.7 in the left femur back in 2017. Patient is on oral bisphosphonate (Fosamax). Will send in refill today. We briefly discussed the potential switch to Prolia if her next bone density demonstrates worsened bone thinning.  7.  RTC in 6 months for MD assessment and labs (CBC with diff, CMP, CA27.29).   Honor Loh, NP  01/26/2018, 12:11 PM   I saw and evaluated the patient, participating in the key portions of the service and reviewing pertinent diagnostic studies and records.  I reviewed the nurse practitioner's note and agree with the findings and the plan.  The assessment and plan were discussed with the patient.  A few questions were asked by the patient and answered.   Nolon Stalls, MD 01/26/2018, 12:11 PM

## 2018-01-26 NOTE — Progress Notes (Signed)
Patient is not taking a lot of her listed medication because she is out of them and has not bought new supply.  Offers no complaints today.

## 2018-01-27 LAB — CA 27.29 (SERIAL MONITOR): CA 27.29: 11 U/mL (ref 0.0–38.6)

## 2018-01-31 DIAGNOSIS — Z7189 Other specified counseling: Secondary | ICD-10-CM | POA: Insufficient documentation

## 2018-05-18 ENCOUNTER — Other Ambulatory Visit: Payer: Self-pay | Admitting: Urgent Care

## 2018-05-18 ENCOUNTER — Ambulatory Visit
Admission: RE | Admit: 2018-05-18 | Discharge: 2018-05-18 | Disposition: A | Discharge status: Home / Self Care | Payer: Medicare Other | Source: Ambulatory Visit | Attending: Urgent Care | Admitting: Urgent Care

## 2018-05-18 ENCOUNTER — Telehealth: Payer: Self-pay | Admitting: *Deleted

## 2018-05-18 DIAGNOSIS — Z170 Estrogen receptor positive status [ER+]: Secondary | ICD-10-CM | POA: Insufficient documentation

## 2018-05-18 DIAGNOSIS — C50911 Malignant neoplasm of unspecified site of right female breast: Secondary | ICD-10-CM

## 2018-05-18 DIAGNOSIS — Z1231 Encounter for screening mammogram for malignant neoplasm of breast: Secondary | ICD-10-CM | POA: Diagnosis not present

## 2018-05-18 DIAGNOSIS — M810 Age-related osteoporosis without current pathological fracture: Secondary | ICD-10-CM

## 2018-05-18 DIAGNOSIS — Z780 Asymptomatic menopausal state: Secondary | ICD-10-CM | POA: Diagnosis not present

## 2018-05-18 NOTE — Telephone Encounter (Signed)
Called patient and LVM that her bone scan showed osteoporosis Previously 2.7 - this result 2.8.

## 2018-05-18 NOTE — Telephone Encounter (Signed)
-----   Message from Karen Kitchens, NP sent at 05/18/2018 11:50 AM EDT ----- Bone density shows osteoporosis. T score -2.8 (previoulsy -2.7).   Rebecca Faulkner  ----- Message ----- From: Interface, Rad Results In Sent: 05/18/2018  10:46 AM To: Karen Kitchens, NP

## 2018-06-01 ENCOUNTER — Ambulatory Visit (INDEPENDENT_AMBULATORY_CARE_PROVIDER_SITE_OTHER): Payer: Medicare Other | Admitting: General Surgery

## 2018-06-01 ENCOUNTER — Ambulatory Visit: Payer: Self-pay

## 2018-06-01 ENCOUNTER — Encounter: Payer: Self-pay | Admitting: General Surgery

## 2018-06-01 VITALS — BP 124/76 | HR 68 | Resp 14 | Ht 62.0 in | Wt 160.0 lb

## 2018-06-01 DIAGNOSIS — N6321 Unspecified lump in the left breast, upper outer quadrant: Secondary | ICD-10-CM | POA: Insufficient documentation

## 2018-06-01 DIAGNOSIS — C50411 Malignant neoplasm of upper-outer quadrant of right female breast: Secondary | ICD-10-CM

## 2018-06-01 DIAGNOSIS — Z170 Estrogen receptor positive status [ER+]: Secondary | ICD-10-CM | POA: Diagnosis not present

## 2018-06-01 DIAGNOSIS — N6320 Unspecified lump in the left breast, unspecified quadrant: Secondary | ICD-10-CM

## 2018-06-01 NOTE — Progress Notes (Signed)
Patient ID: Rebecca Faulkner, female   DOB: May 26, 1941, 77 y.o.   MRN: [id number]  Chief Complaint  Patient presents with  . Breast Cancer    HPI Rebecca Faulkner is a 77 y.o. female who presents for a breast evaluation. The most recent left breast mammogram was done on 05/18/2018 Order by Dr. Mike Gip. . She stopped her Femara in April 2019.  Patient does perform regular self breast checks and gets regular mammograms done.    HPI  Past Medical History:  Diagnosis Date  . Breast cancer ([place]) 2013   right breast  . Heart murmur 1990  . Hypercholesteremia   . Lump or mass in breast   . Malignant neoplasm of upper-outer quadrant of female breast ([place]) 2013   R-breast T1, NO, MO, ER/PR pos., Her 2 neg.  . Osteoporosis   . Ulcer 1974    Past Surgical History:  Procedure Laterality Date  . BREAST SURGERY Right 2013  . CATARACT EXTRACTION W/PHACO Right 04/23/2016   Procedure: CATARACT EXTRACTION PHACO AND INTRAOCULAR LENS PLACEMENT ([place]) RIGHT EYE;  Surgeon: Leandrew Koyanagi, MD;  Location: [facility];  Service: Ophthalmology;  Laterality: Right;  . CATARACT EXTRACTION W/PHACO Left 06/04/2016   Procedure: CATARACT EXTRACTION PHACO AND INTRAOCULAR LENS PLACEMENT ([facility]);  Surgeon: Leandrew Koyanagi, MD;  Location: [facility];  Service: Ophthalmology;  Laterality: Left;  . [place]  . COLONOSCOPY WITH PROPOFOL N/A 07/22/2017   Procedure: COLONOSCOPY WITH PROPOFOL;  Surgeon: Robert Bellow, MD;  Location: [facility];  Service: Endoscopy;  Laterality: N/A;  . FRACTURE SURGERY Left 2011   arm, plate and 12 screws  . LEG SURGERY Right 01/02/2015  . MASTECTOMY Right 2013  . Right inguinal hernia repair  2013  . VEIN SURGERY Left 2015    Family History  Problem Relation Age of Onset  . Dementia Mother   . Stroke Father   . Diabetes Daughter   . Hypertension Daughter   . Breast cancer Neg Hx     Social History Social History   Tobacco Use  .  Smoking status: Never Smoker  . Smokeless tobacco: Never Used  Substance Use Topics  . Alcohol use: No    Comment: Quit drinking  11/04/2016  . Drug use: No    Allergies  Allergen Reactions  . Codeine Nausea Only    Current Outpatient Medications  Medication Sig Dispense Refill  . albuterol (PROVENTIL HFA;VENTOLIN HFA) 108 (90 Base) MCG/ACT inhaler Inhale 2 puffs into the lungs every 6 (six) hours as needed for wheezing or shortness of breath. 1 Inhaler 0  . alendronate (FOSAMAX) 70 MG tablet Take 1 tablet (70 mg total) by mouth once a week. 12 tablet 2  . aspirin EC 81 MG tablet Take 81 mg by mouth daily.    . benzonatate (TESSALON PERLES) 100 MG capsule Take 1 capsule (100 mg total) by mouth every 6 (six) hours as needed for cough. 30 capsule 0  . calcium carbonate (OS-CAL) 600 MG TABS Take 600 mg by mouth 2 (two) times daily with a meal.    . citalopram (CELEXA) 20 MG tablet Take 1 tablet (20 mg total) by mouth daily. 90 tablet 0  . ipratropium (ATROVENT) 0.06 % nasal spray Place 2 sprays into the nose 3 (three) times daily. 15 mL 0  . Vitamin D, Ergocalciferol, (DRISDOL) 50000 units CAPS capsule Take 1 capsule (50,000 Units total) by mouth every 7 (seven) days. 12 capsule 0   No  current facility-administered medications for this visit.     Review of Systems Review of Systems  Constitutional: Negative.   Respiratory: Negative.   Cardiovascular: Negative.     Blood pressure 124/76, pulse 68, resp. rate 14, height 5\' 2"  (1.575 m), weight 160 lb (72.6 kg).  Physical Exam Physical Exam  Constitutional: She is oriented to person, place, and time. She appears well-developed and well-nourished.  Eyes: Conjunctivae are normal. No scleral icterus.  Neck: Neck supple.  Cardiovascular: Normal rate, regular rhythm and normal heart sounds.  Pulmonary/Chest: Effort normal and breath sounds normal.  Left  breast mass at 8:30 ' clock 8 cm from nipple.   Lymphadenopathy:    She has no  cervical adenopathy.  Neurological: She is alert and oriented to person, place, and time.  Skin: Skin is warm and dry.    Data Reviewed Screening mammogram of the left breast dated May 18, 2018 was reviewed. No interval change.  BI-RADS-1.  With the focal thickening in the neck of the left breast 3 cm from the nipple ultrasound was completed to assess this area.   Scanning carefully through the area and both radial and antiradial directions showed no discernible cystic or solid lesion, area of architectural distortion or unusual vascularity.  BI-RADS-1.   Assessment    Prominent adipose tissue without clearly defined lipoma or mass in the medial aspect of the left breast.  Otherwise benign exam.    Plan  A new prescription for breast prosthesis and surgical bras was provided.  Mammograms have been scheduled with medical oncologist.  Arrangements will be for repeat exam in 1 year.  The patient has been asked to return to the office in one year with a bilateral diagnostic mammogram. Order by Dr. Mike Gip. The patient is aware to call back for any questions or concerns.   HPI, Physical Exam, Assessment and Plan have been scribed under the direction and in the presence of Hervey Ard, MD.  Gaspar Cola, CMA  I have completed the exam and reviewed the above documentation for accuracy and completeness.  I agree with the above.  Haematologist has been used and any errors in dictation or transcription are unintentional.  Hervey Ard, M.D., F.A.C.S.   Forest Gleason Jhana Giarratano 06/01/2018, 10:07 PM

## 2018-06-01 NOTE — Patient Instructions (Addendum)
The patient is aware to call back for any questions or concerns. The patient has been asked to return to the office in one year with a bilateral diagnostic mammogram, Order by Dr. Mike Gip. The patient is aware to call back for any questions or concerns.

## 2018-07-27 ENCOUNTER — Inpatient Hospital Stay: Payer: Medicare Other | Attending: Hematology and Oncology

## 2018-07-27 ENCOUNTER — Inpatient Hospital Stay (HOSPITAL_BASED_OUTPATIENT_CLINIC_OR_DEPARTMENT_OTHER): Payer: Medicare Other | Admitting: Hematology and Oncology

## 2018-07-27 ENCOUNTER — Encounter: Payer: Self-pay | Admitting: Hematology and Oncology

## 2018-07-27 ENCOUNTER — Other Ambulatory Visit: Payer: Self-pay

## 2018-07-27 VITALS — BP 147/86 | HR 72 | Temp 97.2°F | Wt 150.2 lb

## 2018-07-27 DIAGNOSIS — C50911 Malignant neoplasm of unspecified site of right female breast: Secondary | ICD-10-CM

## 2018-07-27 DIAGNOSIS — C50411 Malignant neoplasm of upper-outer quadrant of right female breast: Secondary | ICD-10-CM

## 2018-07-27 DIAGNOSIS — M7989 Other specified soft tissue disorders: Secondary | ICD-10-CM | POA: Insufficient documentation

## 2018-07-27 DIAGNOSIS — M810 Age-related osteoporosis without current pathological fracture: Secondary | ICD-10-CM | POA: Diagnosis not present

## 2018-07-27 DIAGNOSIS — S72143A Displaced intertrochanteric fracture of unspecified femur, initial encounter for closed fracture: Secondary | ICD-10-CM | POA: Insufficient documentation

## 2018-07-27 DIAGNOSIS — Z170 Estrogen receptor positive status [ER+]: Secondary | ICD-10-CM | POA: Insufficient documentation

## 2018-07-27 DIAGNOSIS — Z803 Family history of malignant neoplasm of breast: Secondary | ICD-10-CM | POA: Diagnosis not present

## 2018-07-27 LAB — CBC WITH DIFFERENTIAL/PLATELET
Basophils Absolute: 0.1 10*3/uL (ref 0–0.1)
Basophils Relative: 1 %
Eosinophils Absolute: 0.2 10*3/uL (ref 0–0.7)
Eosinophils Relative: 2 %
HCT: 40 % (ref 35.0–47.0)
Hemoglobin: 13.6 g/dL (ref 12.0–16.0)
Lymphocytes Relative: 20 %
Lymphs Abs: 1.3 10*3/uL (ref 1.0–3.6)
MCH: 30.1 pg (ref 26.0–34.0)
MCHC: 33.9 g/dL (ref 32.0–36.0)
MCV: 88.6 fL (ref 80.0–100.0)
Monocytes Absolute: 0.5 10*3/uL (ref 0.2–0.9)
Monocytes Relative: 8 %
Neutro Abs: 4.5 10*3/uL (ref 1.4–6.5)
Neutrophils Relative %: 69 %
Platelets: 230 10*3/uL (ref 150–440)
RBC: 4.52 MIL/uL (ref 3.80–5.20)
RDW: 14.1 % (ref 11.5–14.5)
WBC: 6.6 10*3/uL (ref 3.6–11.0)

## 2018-07-27 LAB — COMPREHENSIVE METABOLIC PANEL
ALT: 14 U/L (ref 0–44)
AST: 20 U/L (ref 15–41)
Albumin: 4.1 g/dL (ref 3.5–5.0)
Alkaline Phosphatase: 48 U/L (ref 38–126)
Anion gap: 10 (ref 5–15)
BUN: 14 mg/dL (ref 8–23)
CO2: 23 mmol/L (ref 22–32)
Calcium: 9.3 mg/dL (ref 8.9–10.3)
Chloride: 108 mmol/L (ref 98–111)
Creatinine, Ser: 0.54 mg/dL (ref 0.44–1.00)
GFR calc Af Amer: >60 mL/min (ref >60)
GFR calc non Af Amer: >60 mL/min (ref >60)
Glucose, Bld: 95 mg/dL (ref 70–99)
Potassium: 3.7 mmol/L (ref 3.5–5.1)
Sodium: 141 mmol/L (ref 135–145)
Total Bilirubin: 0.4 mg/dL (ref 0.3–1.2)
Total Protein: 7.2 g/dL (ref 6.5–8.1)

## 2018-07-27 NOTE — Progress Notes (Signed)
[name] day:  07/27/2018  Chief Complaint: Rebecca Faulkner is a 77 y.o. female with stage IA right breast cancer who is seen for 6 month assessment.  HPI:   The patient was last seen in the medical oncology clinic on 01/26/2018.  At that time, she felt good.  She had no acute concerns. Exam revealed fibrocystic changes in the LEFT breast.  Labs were unremarkable.   She stopped Femara in 01/2018.  Left screening mammogram on 05/18/2018 revealed no evidence of malignancy.  Bone density on 05/18/2018 revealed osteoporosis with a T-score of -2.8 in the left femur and - 1.4 in the left forearm radius.  She was seen by Dr. Bary Castilla on 06/01/2018.  Exam revealed a left breast mass at 8:30 position 8 cm from the nipple.  There was focal thickening in the left breast 3 cm from the nipple.  Ultrasound revealed no discernible cystic or solid lesion, area of architectural distortion or unusual vascularity.  She was felt to have prominent adipose tissue without clearly defined lipoma or mass in the medial aspect of the left breast.    During the interim, patient is doing well overall. She describes an area of concern to her RIGHT knee, but otherwise, she is doing well. Patient continues to work at [employer] 6 days a week. She is active and her quality of life remains high. Patient denies that she has experienced any B symptoms. She denies any interval infections.  Patient does not verbalize any concerns with regards to her breasts today. Patient performs monthly self breast examinations as recommended.   Patient advises that she maintains an adequate appetite. She is eating well. Weight today is 150 lb 3.2 oz (68.1 kg), which compared to her last visit to the clinic, represents a 4 pound increase.    Patient denies pain in the clinic today.   Past Medical History:  Diagnosis Date  . Breast cancer ([place]) 2013   right breast  . Heart murmur 1990  .  Hypercholesteremia   . Lump or mass in breast   . Malignant neoplasm of upper-outer quadrant of female breast ([place]) 2013   R-breast T1, NO, MO, ER/PR pos., Her 2 neg.  . Osteoporosis   . Ulcer 1974    Past Surgical History:  Procedure Laterality Date  . BREAST SURGERY Right 2013  . CATARACT EXTRACTION W/PHACO Right 04/23/2016   Procedure: CATARACT EXTRACTION PHACO AND INTRAOCULAR LENS PLACEMENT ([place]) RIGHT EYE;  Surgeon: Leandrew Koyanagi, MD;  Location: [place];  Service: Ophthalmology;  Laterality: Right;  . CATARACT EXTRACTION W/PHACO Left 06/04/2016   Procedure: CATARACT EXTRACTION PHACO AND INTRAOCULAR LENS PLACEMENT ([facility]);  Surgeon: Leandrew Koyanagi, MD;  Location: [place];  Service: Ophthalmology;  Laterality: Left;  . [place]  . COLONOSCOPY WITH PROPOFOL N/A 07/22/2017   Procedure: COLONOSCOPY WITH PROPOFOL;  Surgeon: Robert Bellow, MD;  Location: [facility] END[facility];  Service: Endoscopy;  Laterality: N/A;  . FRACTURE SURGERY Left 2011   arm, plate and 12 screws  . LEG SURGERY Right 01/02/2015  . MASTECTOMY Right 2013  . Right inguinal hernia repair  2013  . VEIN SURGERY Left 2015    Family History  Problem Relation Age of Onset  . Dementia Mother   . Stroke Father   . Diabetes Daughter   . Hypertension Daughter   . Breast cancer Neg Hx     Social History:  reports that she has  never smoked. She has never used smokeless tobacco. She reports that she does not drink alcohol or use drugs.  She drinks 4-5 beers/day.  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  She lives alone in [place].  The patient is alone today.  Allergies:  Allergies  Allergen Reactions  . Codeine Nausea Only    Current Medications: Current Outpatient Medications  Medication Sig Dispense Refill  . alendronate (FOSAMAX) 70 MG tablet Take 1 tablet (70 mg total) by mouth once a week. 12 tablet 2  . calcium carbonate (OS-CAL) 600 MG  TABS Take 600 mg by mouth 2 (two) times daily with a meal.    . letrozole (FEMARA) 2.5 MG tablet letrozole 2.5 mg tablet    . Vitamin D, Ergocalciferol, (DRISDOL) 50000 units CAPS capsule Take 1 capsule (50,000 Units total) by mouth every 7 (seven) days. 12 capsule 0   No current facility-administered medications for this visit.     Review of Systems  Constitutional: Negative for diaphoresis, fever, malaise/fatigue and weight loss (up 4 pounds).       "I am doing good. I feel good".   HENT: Negative.   Eyes: Negative.   Respiratory: Negative for cough, hemoptysis, sputum production and shortness of breath.   Cardiovascular: Negative for chest pain, palpitations, orthopnea, leg swelling and PND.  Gastrointestinal: Negative for abdominal pain, blood in stool, constipation, diarrhea, melena, nausea and vomiting.  Genitourinary: Negative for dysuria, frequency, hematuria and urgency.  Musculoskeletal: Negative for back pain, falls, joint pain and myalgias.       RIGHT knee swelling  Skin: Negative for itching and rash.  Neurological: Negative for dizziness, tremors, weakness and headaches.  Endo/Heme/Allergies: Does not bruise/bleed easily.  Psychiatric/Behavioral: Negative for depression, memory loss and suicidal ideas. The patient is not nervous/anxious and does not have insomnia.   All other systems reviewed and are negative.  Performance status (ECOG): 0 - Asymptomatic  Vital Signs: BP (!) 147/86 (Patient Position: Sitting)   Pulse 72   Temp (!) 97.2 F (36.2 C) (Tympanic)   Wt 150 lb 3.2 oz (68.1 kg)   BMI 27.47 kg/m   Physical Exam  Constitutional: She is oriented to person, place, and time and well-developed, well-nourished, and in no distress.  HENT:  Head: Normocephalic and atraumatic.  Curly gray hair  Eyes: Pupils are equal, round, and reactive to light. EOM are normal. No scleral icterus.  Glasses.  Brown eyes.  Neck: Normal range of motion. Neck supple. No tracheal  deviation present. No thyromegaly present.  Cardiovascular: Normal rate, regular rhythm and normal heart sounds. Exam reveals no gallop and no friction rub.  No murmur heard. Pulmonary/Chest: Effort normal and breath sounds normal. No respiratory distress. She has no wheezes. She has no rales. She exhibits no tenderness and no edema. Right breast exhibits skin change (s/p right mastectomy without erythema or nodularity). Left breast exhibits skin change (fibrocystic changes at 7 o'clock). Left breast exhibits no inverted nipple, no mass and no nipple discharge.  Abdominal: Soft. Bowel sounds are normal. She exhibits no distension. There is no tenderness.  Musculoskeletal: Normal range of motion. She exhibits no edema or tenderness.       Right knee: She exhibits swelling (soft and freely moveable area of swelling to medial aspect; denies pain).  Lymphadenopathy:    She has no cervical adenopathy.    She has no axillary adenopathy.       Right: No inguinal and no supraclavicular  adenopathy present.       Left: No inguinal and no supraclavicular adenopathy present.  Neurological: She is alert and oriented to person, place, and time.  Skin: Skin is warm and dry. No rash noted. No erythema.  Psychiatric: Mood, affect and judgment normal.  Nursing note and vitals reviewed.   Appointment on 07/27/2018  Component Date Value Ref Range Status  . Sodium 07/27/2018 141  135 - 145 mmol/L Final  . Potassium 07/27/2018 3.7  3.5 - 5.1 mmol/L Final  . Chloride 07/27/2018 108  98 - 111 mmol/L Final  . CO2 07/27/2018 23  22 - 32 mmol/L Final  . Glucose, Bld 07/27/2018 95  70 - 99 mg/dL Final  . BUN 07/27/2018 14  8 - 23 mg/dL Final  . Creatinine, Ser 07/27/2018 0.54  0.44 - 1.00 mg/dL Final  . Calcium 07/27/2018 9.3  8.9 - 10.3 mg/dL Final  . Total Protein 07/27/2018 7.2  6.5 - 8.1 g/dL Final  . Albumin 07/27/2018 4.1  3.5 - 5.0 g/dL Final  . AST 07/27/2018 20  15 - 41 U/L Final  . ALT 07/27/2018 14  0  - 44 U/L Final  . Alkaline Phosphatase 07/27/2018 48  38 - 126 U/L Final  . Total Bilirubin 07/27/2018 0.4  0.3 - 1.2 mg/dL Final  . GFR calc non Af Amer 07/27/2018 >60  >60 mL/min Final  . GFR calc Af Amer 07/27/2018 >60  >60 mL/min Final   Comment: (NOTE) The eGFR has been calculated using the CKD EPI equation. This calculation has not been validated in all clinical situations. eGFR's persistently <60 mL/min signify possible Chronic Kidney Disease.   Georgiann Hahn gap 07/27/2018 10  5 - 15 Final   Performed at [facility], [address] [place], [place] [address]  . WBC 07/27/2018 6.6  3.6 - 11.0 K/uL Final  . RBC 07/27/2018 4.52  3.80 - 5.20 MIL/uL Final  . Hemoglobin 07/27/2018 13.6  12.0 - 16.0 g/dL Final  . HCT 07/27/2018 40.0  35.0 - 47.0 % Final  . MCV 07/27/2018 88.6  80.0 - 100.0 fL Final  . MCH 07/27/2018 30.1  26.0 - 34.0 pg Final  . MCHC 07/27/2018 33.9  32.0 - 36.0 g/dL Final  . RDW 07/27/2018 14.1  11.5 - 14.5 % Final  . Platelets 07/27/2018 230  150 - 440 K/uL Final  . Neutrophils Relative % 07/27/2018 69  % Final  . Neutro Abs 07/27/2018 4.5  1.4 - 6.5 K/uL Final  . Lymphocytes Relative 07/27/2018 20  % Final  . Lymphs Abs 07/27/2018 1.3  1.0 - 3.6 K/uL Final  . Monocytes Relative 07/27/2018 8  % Final  . Monocytes Absolute 07/27/2018 0.5  0.2 - 0.9 K/uL Final  . Eosinophils Relative 07/27/2018 2  % Final  . Eosinophils Absolute 07/27/2018 0.2  0 - 0.7 K/uL Final  . Basophils Relative 07/27/2018 1  % Final  . Basophils Absolute 07/27/2018 0.1  0 - 0.1 K/uL Final   Performed at [facility], [address] [place], [place] [address]    Assessment:  ALVENIA TREESE is a 77 y.o. female with stage IA right breast cancer s/p right modified radical mastectomy with sentinel lymph node biopsy on 05/27/2012.  Pathology revealed a 1.8 cm grade I invasive carcinoma.  There was angiolymphatic invasion.  There was low grade DCIS.  One sentinel node and 5  non-sentinel nodes were removed.  Zero of 6 nodes were positive.  Tumor  was ER + (90%).  PR + (90%).  HER-2 receptor - by FISH.  Pathologic stage was T1cN0M0 breast cancer.  Oncotype DX score was 10 which translated into a 7% chance of recurrent disease in 10 years of tamoxifen.  She began Femara in 07/2012.  She stopped Femara in 01/2018.  Left mammogram on 05/18/2018 revealed no evidence of malignancy.  Ultrasound on 06/01/2018 revealed no discernible cystic or solid lesion, area of architectural distortion or unusual vascularity.   CA27.29 has been followed:  14.3 on 02/02/2013, 13.9 on 08/11/2013, 10.1 on 02/20/2014, 14.3 on 08/22/2014, 5.6 on 07/01/2016, 14.0 on 01/19/2017, 8.0 on 07/28/2017, and 11 on 01/26/2018.  She was admitted with a right femur fracture s/p fall in 12/2014.  She was on [place], calcium and vitamin D.  She is on Fosamax.  Bone density on 08/08/2014 revealed osteoporosis with a T score of -2.9 in the left femur, -1.6 in the left forearm radius.  Bone density on 05/12/2016 revealed osteoporosis with a T score of -2.7 in the left femur, -1.4 in the left forearm radius.  Bone density on 05/18/2018 revealed osteoporosis with a T-score of -2.8 in the left femur and - 1.4 in the left forearm radius.  Colonoscopy on 07/22/2017 revealed diverticulosis in the sigmoid colon and one 5 mm polyp in the rectum.  Pathology revealed a tubular adenoma.  Symptomatically, patient feels generally well.  She denies any significant complaints with regards to her breast.  Patient complains of atraumatic swelling to her right knee.  Area is nonpainful.  Exam reveals an area of soft tissue swelling that is freely movable to the medial aspect of the right knee.  She has stable fibrocystic changes in her left breast.  Labs are unremarkable.  Plan: 1. Labs today:  CBC with diff, CMP, CA27.29. 2. Stage IA right breast cancer:  Patient doing well.  No breast concerns.  Discuss interval mammogram  and ultrasound -no evidence of malignancy.  Review discussion regarding BCI testing to determine need for extended adjuvant hormonal therapy (5 vs 10 years). Insurance did not cover.   Patient discontinued letrozole.   Schedule annual LEFT screening mammogram for 05/20/2019. 3. Osteoporosis:  Discuss interval bone density.  Ten-year fracture probability by FRAX of 3% or greater for hip fracture or 20% or greater for major osteoporotic fracture.  Patient on oral bisphosphonate (Fosamax) therapy and supplemental calcium 1200 mg daily.  4. RTC in 1 year for MD assessment and labs (CBC with diff, CMP, CA27.29), and review of mammogram.    Honor Loh, NP  07/27/2018, 11:33 AM   I saw and evaluated the patient, participating in the key portions of the service and reviewing pertinent diagnostic studies and records.  I reviewed the nurse practitioner's note and agree with the findings and the plan.  The assessment and plan were discussed with the patient.  A few questions were asked by the patient and answered.   Nolon Stalls, MD 07/27/2018, 11:33 AM

## 2018-07-28 LAB — CANCER ANTIGEN 27.29: CA 27.29: 12.4 U/mL (ref 0.0–38.6)

## 2018-10-26 ENCOUNTER — Emergency Department: Payer: Medicare Other

## 2018-10-26 ENCOUNTER — Encounter: Payer: Self-pay | Admitting: Emergency Medicine

## 2018-10-26 ENCOUNTER — Other Ambulatory Visit: Payer: Self-pay

## 2018-10-26 ENCOUNTER — Emergency Department
Admission: EM | Admit: 2018-10-26 | Discharge: 2018-10-26 | Disposition: A | Discharge status: Home / Self Care | Payer: Medicare Other | Attending: Emergency Medicine | Admitting: Emergency Medicine

## 2018-10-26 DIAGNOSIS — R42 Dizziness and giddiness: Secondary | ICD-10-CM | POA: Diagnosis not present

## 2018-10-26 DIAGNOSIS — Z853 Personal history of malignant neoplasm of breast: Secondary | ICD-10-CM | POA: Diagnosis not present

## 2018-10-26 DIAGNOSIS — H81399 Other peripheral vertigo, unspecified ear: Secondary | ICD-10-CM | POA: Diagnosis not present

## 2018-10-26 LAB — BASIC METABOLIC PANEL
Anion gap: 10 (ref 5–15)
BUN: 12 mg/dL (ref 8–23)
CALCIUM: 9.6 mg/dL (ref 8.9–10.3)
CO2: 20 mmol/L — ABNORMAL LOW (ref 22–32)
Chloride: 107 mmol/L (ref 98–111)
Creatinine, Ser: 0.63 mg/dL (ref 0.44–1.00)
GFR calc Af Amer: >60 mL/min (ref >60)
GFR calc non Af Amer: >60 mL/min (ref >60)
Glucose, Bld: 98 mg/dL (ref 70–99)
Potassium: 3.7 mmol/L (ref 3.5–5.1)
Sodium: 137 mmol/L (ref 135–145)

## 2018-10-26 LAB — CBC
HCT: 40.7 % (ref 36.0–46.0)
Hemoglobin: 13.7 g/dL (ref 12.0–15.0)
MCH: 29.5 pg (ref 26.0–34.0)
MCHC: 33.7 g/dL (ref 30.0–36.0)
MCV: 87.7 fL (ref 80.0–100.0)
PLATELETS: 257 10*3/uL (ref 150–400)
RBC: 4.64 MIL/uL (ref 3.87–5.11)
RDW: 13.5 % (ref 11.5–15.5)
WBC: 7.4 10*3/uL (ref 4.0–10.5)
nRBC: 0 % (ref 0.0–0.2)

## 2018-10-26 LAB — URINALYSIS, COMPLETE (UACMP) WITH MICROSCOPIC
BILIRUBIN URINE: NEGATIVE
Glucose, UA: NEGATIVE mg/dL
Ketones, ur: NEGATIVE mg/dL
Leukocytes, UA: NEGATIVE
NITRITE: NEGATIVE
Protein, ur: NEGATIVE mg/dL
Specific Gravity, Urine: 1.004 — ABNORMAL LOW (ref 1.005–1.030)
pH: 6 (ref 5.0–8.0)

## 2018-10-26 LAB — TROPONIN I: Troponin I: <0.03 ng/mL (ref <0.03)

## 2018-10-26 MED ORDER — MECLIZINE HCL 25 MG PO TABS
25.0000 mg | ORAL_TABLET | Freq: Once | ORAL | Status: AC
  Administered 2018-10-26: 25 mg via ORAL
  Filled 2018-10-26: qty 1

## 2018-10-26 MED ORDER — MECLIZINE HCL 25 MG PO TABS: 25.0000 mg | ORAL_TABLET | Freq: Three times a day (TID) | ORAL | 1 refills | Status: DC | PRN

## 2018-10-26 NOTE — ED Triage Notes (Signed)
Pt to ED from home c/o dizziness since yesterday, denies falling or LOC.  Denies pain, SOB, or visual changes.  Pt A&OX4, speaking in complete and coherent sentences, chest rise even and unlabored, skin warm and dry, strength equal and strong throughout extremities.

## 2018-10-26 NOTE — ED Provider Notes (Signed)
[facility] Emergency Department Provider Note       Time seen: ----------------------------------------- 7:19 PM on 10/26/2018 -----------------------------------------   I have reviewed the triage vital signs and the nursing notes.  HISTORY   Chief Complaint Dizziness    HPI Rebecca Faulkner is a 77 y.o. female with a history of breast cancer, hyperlipidemia who presents to the ED for dizziness since yesterday.  Patient denies falling or loss of consciousness.  She denies pain, shortness of breath or visual changes.  She has never had this happen before, describes a swimmy headed feeling and pressure in her head.  Past Medical History:  Diagnosis Date  . Breast cancer ([place]) 2013   right breast  . Heart murmur 1990  . Hypercholesteremia   . Lump or mass in breast   . Malignant neoplasm of upper-outer quadrant of female breast ([place]) 2013   R-breast T1, NO, MO, ER/PR pos., Her 2 neg.  . Osteoporosis   . Ulcer 1974    Patient Active Problem List   Diagnosis Date Noted  . Closed comminuted intertrochanteric fracture of proximal femur ([place]) 07/27/2018  . Mass of upper outer quadrant of left breast 06/01/2018  . Goals of care, counseling/discussion 01/31/2018  . Osteoporosis without current pathological fracture 07/28/2017  . Depression, major, recurrent, mild ([place]) 07/07/2017  . B12 deficiency 07/07/2017  . Vitamin D deficiency 07/07/2017  . Screening for colon cancer 05/27/2017  . Malignant neoplasm of upper-outer quadrant of right breast in female, estrogen receptor positive ([place]) 05/27/2017  . Glaucoma 05/26/2017  . History of alcoholism ([place]) 05/26/2017  . History of hip fracture 05/10/2015  . Breast cancer, right ([place]) 05/27/2012  . Estrogen receptor positive status (ER+) 05/14/2012    Past Surgical History:  Procedure Laterality Date  . BREAST SURGERY Right 2013  . CATARACT EXTRACTION W/PHACO Right 04/23/2016   Procedure: CATARACT  EXTRACTION PHACO AND INTRAOCULAR LENS PLACEMENT ([place]) RIGHT EYE;  Surgeon: Leandrew Koyanagi, MD;  Location: [place];  Service: Ophthalmology;  Laterality: Right;  . CATARACT EXTRACTION W/PHACO Left 06/04/2016   Procedure: CATARACT EXTRACTION PHACO AND INTRAOCULAR LENS PLACEMENT ([facility]);  Surgeon: Leandrew Koyanagi, MD;  Location: [place];  Service: Ophthalmology;  Laterality: Left;  . [place]  . COLONOSCOPY WITH PROPOFOL N/A 07/22/2017   Procedure: COLONOSCOPY WITH PROPOFOL;  Surgeon: Robert Bellow, MD;  Location: [facility];  Service: Endoscopy;  Laterality: N/A;  . FRACTURE SURGERY Left 2011   arm, plate and 12 screws  . LEG SURGERY Right 01/02/2015  . MASTECTOMY Right 2013  . Right inguinal hernia repair  2013  . VEIN SURGERY Left 2015    Allergies Codeine  Social History Social History   Tobacco Use  . Smoking status: Never Smoker  . Smokeless tobacco: Never Used  Substance Use Topics  . Alcohol use: No    Comment: Quit drinking  11/04/2016  . Drug use: No   Review of Systems Constitutional: Negative for fever. Cardiovascular: Negative for chest pain. Respiratory: Negative for shortness of breath. Gastrointestinal: Negative for abdominal pain, vomiting and diarrhea. Musculoskeletal: Negative for back pain. Skin: Negative for rash. Neurological: Negative for headaches, focal weakness or numbness.  Positive for dizziness  All systems negative/normal/unremarkable except as stated in the HPI  ____________________________________________   PHYSICAL EXAM:  VITAL SIGNS: ED Triage Vitals  Enc Vitals Group     BP 10/26/18 1624 (!) 136/91     Pulse Rate 10/26/18 1624 90  Resp 10/26/18 1624 14     Temp 10/26/18 1624 97.9 F (36.6 C)     Temp Source 10/26/18 1624 Oral     SpO2 10/26/18 1624 99 %     Weight 10/26/18 1619 150 lb (68 kg)     Height 10/26/18 1619 5\' 1"  (1.549 m)     Head Circumference --      Peak Flow --       Pain Score 10/26/18 1619 0     Pain Loc --      Pain Edu? --      Excl. in [place]? --    Constitutional: Alert and oriented. Well appearing and in no distress. Eyes: Conjunctivae are normal. Normal extraocular movements. ENT   Head: Normocephalic and atraumatic.   Nose: No congestion/rhinnorhea.   Mouth/Throat: Mucous membranes are moist.   Neck: No stridor. Cardiovascular: Normal rate, regular rhythm. No murmurs, rubs, or gallops. Respiratory: Normal respiratory effort without tachypnea nor retractions. Breath sounds are clear and equal bilaterally. No wheezes/rales/rhonchi. Gastrointestinal: Soft and nontender. Normal bowel sounds Musculoskeletal: Nontender with normal range of motion in extremities. No lower extremity tenderness nor edema. Neurologic:  Normal speech and language. No gross focal neurologic deficits are appreciated.  Strength, sensation, cranial nerves appear to be normal.  She was a little off balance with standing. Skin:  Skin is warm, dry and intact. No rash noted. Psychiatric: Mood and affect are normal. Speech and behavior are normal.  ____________________________________________  EKG: Interpreted by me.  Sinus rhythm the rate of 91 bpm, low voltage QRS, normal axis, normal QT ____________________________________________  ED COURSE:  As part of my medical decision making, I reviewed the following data within the [place] History obtained from family if available, nursing notes, old chart and ekg, as well as notes from prior ED visits. Patient presented for vertigo, we will assess with labs and imaging as indicated at this time.   Procedures ____________________________________________   LABS (pertinent positives/negatives)  Labs Reviewed  BASIC METABOLIC PANEL - Abnormal; Notable for the following components:      Result Value   CO2 20 (*)    All other components within normal limits  URINALYSIS, COMPLETE (UACMP) WITH  MICROSCOPIC - Abnormal; Notable for the following components:   Color, Urine STRAW (*)    APPearance CLEAR (*)    Specific Gravity, Urine 1.004 (*)    Hgb urine dipstick SMALL (*)    Bacteria, UA RARE (*)    All other components within normal limits  CBC  TROPONIN I    RADIOLOGY Images were viewed by me  CT head IMPRESSION: 1. No CT evidence for acute intracranial abnormality. 2. Atrophy  ____________________________________________  DIFFERENTIAL DIAGNOSIS   Peripheral versus central vertigo, dehydration, electrolyte abnormality, anemia, occult infection  FINAL ASSESSMENT AND PLAN  Peripheral vertigo   Plan: The patient had presented for room spinning sensation. Patient's labs are reassuring. Patient's imaging did not reveal any acute process.  She will be discharged with meclizine and is cleared for outpatient follow-up.   Laurence Aly, MD   Note: This note was generated in part or whole with voice recognition software. Voice recognition is usually quite accurate but there are transcription errors that can and very often do occur. I apologize for any typographical errors that were not detected and corrected.     Earleen Newport, MD 10/26/18 2018

## 2019-05-16 ENCOUNTER — Encounter: Payer: Self-pay | Admitting: General Surgery

## 2019-05-31 ENCOUNTER — Ambulatory Visit: Payer: Medicare Other | Admitting: General Surgery

## 2019-07-05 ENCOUNTER — Ambulatory Visit
Admission: RE | Admit: 2019-07-05 | Discharge: 2019-07-05 | Disposition: A | Discharge status: Home / Self Care | Payer: Medicare Other | Source: Ambulatory Visit | Attending: Urgent Care | Admitting: Urgent Care

## 2019-07-05 DIAGNOSIS — Z1231 Encounter for screening mammogram for malignant neoplasm of breast: Secondary | ICD-10-CM | POA: Diagnosis not present

## 2019-07-05 DIAGNOSIS — C50411 Malignant neoplasm of upper-outer quadrant of right female breast: Secondary | ICD-10-CM

## 2019-07-05 DIAGNOSIS — Z853 Personal history of malignant neoplasm of breast: Secondary | ICD-10-CM | POA: Diagnosis not present

## 2019-07-05 DIAGNOSIS — Z170 Estrogen receptor positive status [ER+]: Secondary | ICD-10-CM

## 2019-07-06 ENCOUNTER — Other Ambulatory Visit: Payer: Self-pay | Admitting: Urgent Care

## 2019-07-06 DIAGNOSIS — N6489 Other specified disorders of breast: Secondary | ICD-10-CM

## 2019-07-06 DIAGNOSIS — R928 Other abnormal and inconclusive findings on diagnostic imaging of breast: Secondary | ICD-10-CM

## 2019-07-07 ENCOUNTER — Other Ambulatory Visit: Payer: Self-pay | Admitting: Hematology and Oncology

## 2019-07-07 DIAGNOSIS — N6489 Other specified disorders of breast: Secondary | ICD-10-CM

## 2019-07-07 DIAGNOSIS — R928 Other abnormal and inconclusive findings on diagnostic imaging of breast: Secondary | ICD-10-CM

## 2019-07-15 ENCOUNTER — Ambulatory Visit: Payer: Medicare Other | Admitting: Surgery

## 2019-07-18 ENCOUNTER — Ambulatory Visit
Admission: RE | Admit: 2019-07-18 | Discharge: 2019-07-18 | Disposition: A | Discharge status: Home / Self Care | Payer: Medicare Other | Source: Ambulatory Visit | Attending: Hematology and Oncology | Admitting: Hematology and Oncology

## 2019-07-18 DIAGNOSIS — N6489 Other specified disorders of breast: Secondary | ICD-10-CM | POA: Diagnosis not present

## 2019-07-18 DIAGNOSIS — R928 Other abnormal and inconclusive findings on diagnostic imaging of breast: Secondary | ICD-10-CM

## 2019-07-28 NOTE — Progress Notes (Signed)
[facility]  [address] [place], [place] [address] Phone: [phone]  Fax: [phone]   Clinic Day:  07/29/2019  Referring physician: Steele Sizer, MD  Chief Complaint: Rebecca Faulkner is a 78 y.o. female with stage IA right breast cancer who is seen for 1 year assessment  HPI: The patient was last seen in the medical oncology clinic on 07/27/2018. At that time, patient felt generally well.  She denied any breast concerns.  Patient noted atraumatic swelling to her right knee.  Exam revealed an area of soft tissue swelling that was freely movable to the medial aspect of the right knee.  She had stable fibrocystic changes in her left breast.  Labs were unremarkable.  She was seen in the [facility] ER on 10/26/2018 for vertigo. Patient had dizziness since the day before, denied falling or loss of consciousness.  She denied pain, shortness of breath or visual changes.  She described being swimmy headed with pressure in her head. Patient's labs were reassuring. Head CT without contrast unremarkable.  She was discharged on meclizine.  Screening left mammogram on 07/05/2019 revealed possible asymmetry in the left breast.  Diagnostic left mammogram and ultrasound on 07/18/2019 revealed a probably benign asymmetry within the left breast demonstrated best on the ML and MLO views.  Ultrasound revealed no discrete solid or cystic lesion.  Recommendation was for left breast diagnostic mammography and possible ultrasound in 6 months to ensure stability.  During the interim, she has done well.  She states that 2 months after CT scan, a hard yellow ball of wax fell from her ear. Every now and then will get light headed; she says she will shake her head hard and will go on about her day. She hasn't followed up with her PCP, Dr. Ancil Boozer, in almost 3 years.   She doesn't take her calcium, vitamin D as she should. She doesn't take Fosamax. She believes she saw the dentist 2  years ago.    Past Medical History:  Diagnosis Date  . Breast cancer ([place]) 2013   right breast  . Heart murmur 1990  . Hypercholesteremia   . Lump or mass in breast   . Malignant neoplasm of upper-outer quadrant of female breast ([place]) 2013   R-breast T1, NO, MO, ER/PR pos., Her 2 neg.  . Osteoporosis   . Ulcer 1974    Past Surgical History:  Procedure Laterality Date  . BREAST SURGERY Right 2013  . CATARACT EXTRACTION W/PHACO Right 04/23/2016   Procedure: CATARACT EXTRACTION PHACO AND INTRAOCULAR LENS PLACEMENT ([place]) RIGHT EYE;  Surgeon: Leandrew Koyanagi, MD;  Location: [name];  Service: Ophthalmology;  Laterality: Right;  . CATARACT EXTRACTION W/PHACO Left 06/04/2016   Procedure: CATARACT EXTRACTION PHACO AND INTRAOCULAR LENS PLACEMENT ([facility]);  Surgeon: Leandrew Koyanagi, MD;  Location: [facility];  Service: Ophthalmology;  Laterality: Left;  . [place]  . COLONOSCOPY WITH PROPOFOL N/A 07/22/2017   Procedure: COLONOSCOPY WITH PROPOFOL;  Surgeon: Robert Bellow, MD;  Location: [facility] ENDOSCOPY;  Service: Endoscopy;  Laterality: N/A;  . FRACTURE SURGERY Left 2011   arm, plate and 12 screws  . LEG SURGERY Right 01/02/2015  . MASTECTOMY Right 2013  . Right inguinal hernia repair  2013  . VEIN SURGERY Left 2015    Family History  Problem Relation Age of Onset  . Dementia Mother   . Stroke Father   . Diabetes Daughter   . Hypertension Daughter   . Breast  cancer Neg Hx     Social History:  reports that she has never smoked. She has never used smokeless tobacco. She reports that she does not drink alcohol or use drugs. 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 She lives alone in [place].  She stopped working for past 6 months due to COVID-19. The patient is alone today.  Allergies:  Allergies  Allergen Reactions  . Codeine Nausea Only    Current Medications: Current Outpatient Medications  Medication Sig  Dispense Refill  . alendronate (FOSAMAX) 70 MG tablet Take 1 tablet (70 mg total) by mouth once a week. 12 tablet 2  . calcium carbonate (OS-CAL) 600 MG TABS Take 600 mg by mouth 2 (two) times daily with a meal.    . Vitamin D, Ergocalciferol, (DRISDOL) 50000 units CAPS capsule Take 1 capsule (50,000 Units total) by mouth every 7 (seven) days. 12 capsule 0  . meclizine (ANTIVERT) 25 MG tablet Take 1 tablet (25 mg total) by mouth 3 (three) times daily as needed for dizziness or nausea. (Patient not taking: Reported on 07/29/2019) 30 tablet 1   No current facility-administered medications for this visit.     Review of Systems  Constitutional: Positive for weight loss (loss 4 pounds). Negative for chills, diaphoresis, fever and malaise/fatigue.       No complaints.  HENT: Negative.  Negative for congestion, ear pain, nosebleeds, sinus pain and sore throat.   Eyes: Negative.  Negative for blurred vision, double vision and photophobia.  Respiratory: Negative.  Negative for cough, hemoptysis, sputum production and shortness of breath.   Cardiovascular: Negative.  Negative for chest pain, palpitations, orthopnea, leg swelling and PND.  Gastrointestinal: Negative.  Negative for abdominal pain, blood in stool, constipation, diarrhea, melena, nausea and vomiting.  Genitourinary: Negative.  Negative for dysuria, frequency, hematuria and urgency.  Musculoskeletal: Negative.  Negative for back pain, falls, joint pain, myalgias and neck pain.  Skin: Negative.  Negative for itching and rash.  Neurological: Positive for dizziness (now and then light headedness). Negative for tremors, weakness and headaches.  Endo/Heme/Allergies: Negative.  Does not bruise/bleed easily.  Psychiatric/Behavioral: Negative.  Negative for depression, memory loss and suicidal ideas. The patient is not nervous/anxious and does not have insomnia.   All other systems reviewed and are negative.  Performance status (ECOG): 0  Vitals  Blood pressure 114/78, pulse 87, temperature 98.8 F (37.1 C), temperature source Tympanic, resp. rate 18, height '5\' 2"'  (1.575 m), weight 146 lb 9.7 oz (66.5 kg), SpO2 100 %.   Physical Exam  Constitutional: She is oriented to person, place, and time. She appears well-developed and well-nourished. No distress.  HENT:  Head: Normocephalic and atraumatic.  Mouth/Throat: Oropharynx is clear and moist. No oropharyngeal exudate.  Shoulder length curly gray hair.  Mask.  Eyes: Pupils are equal, round, and reactive to light. Conjunctivae and EOM are normal. No scleral icterus.  Glasses.  Brown eyes.  Neck: Normal range of motion. Neck supple. No JVD present.  Cardiovascular: Normal rate, regular rhythm and normal heart sounds. Exam reveals no gallop and no friction rub.  No murmur heard. Pulmonary/Chest: Effort normal and breath sounds normal. No respiratory distress. She has no wheezes. She has no rales. Breasts are symmetrical (right mastectomy; scattered fibrocystic changes in left breast.).  Abdominal: Soft. Bowel sounds are normal. She exhibits no distension and no mass. There is no abdominal tenderness. There is no rebound and no guarding.  Musculoskeletal: Normal range of motion.  Lymphadenopathy:    She has no cervical adenopathy.    She has no axillary adenopathy.       Right: No inguinal adenopathy present.       Left: No inguinal adenopathy present.  Neurological: She is alert and oriented to person, place, and time.  Skin: Skin is warm and dry. No rash noted. No erythema. No pallor.  Psychiatric: She has a normal mood and affect. Her behavior is normal. Judgment and thought content normal.  Nursing note and vitals reviewed.   Appointment on 07/29/2019  Component Date Value Ref Range Status  . WBC 07/29/2019 7.7  4.0 - 10.5 K/uL Final  . RBC 07/29/2019 4.75  3.87 - 5.11 MIL/uL Final  . Hemoglobin 07/29/2019 13.9  12.0 - 15.0 g/dL Final  . HCT 07/29/2019 42.2  36.0 - 46.0 % Final   . MCV 07/29/2019 88.8  80.0 - 100.0 fL Final  . MCH 07/29/2019 29.3  26.0 - 34.0 pg Final  . MCHC 07/29/2019 32.9  30.0 - 36.0 g/dL Final  . RDW 07/29/2019 14.4  11.5 - 15.5 % Final  . Platelets 07/29/2019 264  150 - 400 K/uL Final  . nRBC 07/29/2019 0.0  0.0 - 0.2 % Final  . Neutrophils Relative % 07/29/2019 70  % Final  . Neutro Abs 07/29/2019 5.4  1.7 - 7.7 K/uL Final  . Lymphocytes Relative 07/29/2019 19  % Final  . Lymphs Abs 07/29/2019 1.5  0.7 - 4.0 K/uL Final  . Monocytes Relative 07/29/2019 8  % Final  . Monocytes Absolute 07/29/2019 0.6  0.1 - 1.0 K/uL Final  . Eosinophils Relative 07/29/2019 1  % Final  . Eosinophils Absolute 07/29/2019 0.1  0.0 - 0.5 K/uL Final  . Basophils Relative 07/29/2019 1  % Final  . Basophils Absolute 07/29/2019 0.1  0.0 - 0.1 K/uL Final  . Immature Granulocytes 07/29/2019 1  % Final  . Abs Immature Granulocytes 07/29/2019 0.11* 0.00 - 0.07 K/uL Final   Performed at [facility], [address], [place], [place] [address]    Assessment:  Rebecca Faulkner is a 78 y.o. female with stage IA right breast cancer s/p right modified radical mastectomy with sentinel lymph node biopsy on 05/27/2012.  Pathology revealed a 1.8 cm grade I invasive carcinoma.  There was angiolymphatic invasion.  There was low grade DCIS.  One sentinel node and 5 non-sentinel nodes were removed.  Zero of 6 nodes were positive.  Tumor was ER + (90%). PR + (90%). HER-2 receptor - by FISH.  Pathologic stage was T1cN0M0 breast cancer.  Oncotype DX score was 10 which translated into a 7% chance of recurrent disease in 10 years of tamoxifen.  She began Femara in 07/2012.  She stopped Femara in 01/2018.  Left mammogram on 05/18/2018 revealed no evidence of malignancy.  Ultrasound on 06/01/2018 revealed no discernible cystic or solid lesion, area of architectural distortion or unusual vascularity. Screening left mammogram on 07/05/2019 revealed possible asymmetry in the  left breast.  Diagnostic left mammogram and ultrasound on 07/18/2019 revealed a probably benign asymmetry within the left breast demonstrated best on the ML and MLO views.  Ultrasound revealed no discrete solid or cystic lesion.    CA27.29 has been followed:  14.3 on 02/02/2013, 13.9 on 08/11/2013, 10.1 on 02/20/2014, 14.3 on 08/22/2014, 5.6 on 07/01/2016, 14.0 on 01/19/2017, 8.0 on 07/28/2017, 11 on 01/26/2018, and 12.4 on 07/27/2018.  She was admitted with a right femur fracture s/p fall in 12/2014.  She was on [place], calcium and vitamin D.  She was on Fosamax.  Bone density on 08/08/2014 revealed osteoporosis with a T score of -2.9 in the left femur, -1.6 in the left forearm radius.  Bone density on 05/12/2016 revealed osteoporosis with a T score of -2.7 in the left femur, -1.4 in the left forearm radius.  Bone density on 05/18/2018 revealed osteoporosis with a T-score of -2.8 in the left femur and - 1.4 in the left forearm radius.  Colonoscopy on 07/22/2017 revealed diverticulosis in the sigmoid colon and one 5 mm polyp in the rectum.  Pathology revealed a tubular adenoma.  Symptomatically, she is doing well.  Exam is unremarkable.  Plan: 1.   Labs today:  CBC with diff, CMP, CA27.29. 2.   Stage IA right breast cancer Clinically, she is doing well. Exam reveals no evidence of recurrent disease. Diagnostic left mammogram and ultrasound on 07/18/2019 revealed a probably benign asymmetry within the left breast.    Ultrasound revealed no discrete solid or cystic lesion.    Discuss plan for follow-up in 6 months.  Patient off Femara since 01/2018. Schedule mammogram on 01/16/2020. 3.   Osteoporosis Patient off Fosamax. Discuss calcium and vitamin D. Discuss consideration of Prolia.  Encourage follow-up with dentist for clearance.  Information on Prolia provided. Schedule bone density 05/18/2020. 4.   Encourage follow-up with Dr. Ancil Boozer. 5.   RTC in 1 year for MD assessment and labs  (CBC with diff, CMP, CA27.29).  I discussed the assessment and treatment plan with the patient.  The patient was provided an opportunity to ask questions and all were answered.  The patient agreed with the plan and demonstrated an understanding of the instructions.  The patient was advised to call back if the symptoms worsen or if the condition fails to improve as anticipated.  I provided 15 minutes (11:40 AM - 11:55 AM) of face-to-face time during this this encounter and > 50% was spent counseling as documented under my assessment and plan.    Lequita Asal, MD, PhD    07/29/2019, 11:55 AM  I, Samul Dada, am acting as a scribe for Lequita Asal, MD.  I, [place] Mike Gip, MD, have reviewed the above documentation for accuracy and completeness, and I agree with the above.

## 2019-07-29 ENCOUNTER — Other Ambulatory Visit: Payer: Medicare Other

## 2019-07-29 ENCOUNTER — Ambulatory Visit: Payer: Medicare Other | Admitting: Surgery

## 2019-07-29 ENCOUNTER — Inpatient Hospital Stay: Payer: Medicare Other | Attending: Hematology and Oncology

## 2019-07-29 ENCOUNTER — Ambulatory Visit: Payer: Medicare Other | Admitting: Hematology and Oncology

## 2019-07-29 ENCOUNTER — Inpatient Hospital Stay (HOSPITAL_BASED_OUTPATIENT_CLINIC_OR_DEPARTMENT_OTHER): Payer: Medicare Other | Admitting: Hematology and Oncology

## 2019-07-29 ENCOUNTER — Other Ambulatory Visit: Payer: Self-pay

## 2019-07-29 ENCOUNTER — Encounter: Payer: Self-pay | Admitting: Hematology and Oncology

## 2019-07-29 VITALS — BP 114/78 | HR 87 | Temp 98.8°F | Resp 18 | Ht 62.0 in | Wt 146.6 lb

## 2019-07-29 DIAGNOSIS — R42 Dizziness and giddiness: Secondary | ICD-10-CM | POA: Diagnosis not present

## 2019-07-29 DIAGNOSIS — Z170 Estrogen receptor positive status [ER+]: Secondary | ICD-10-CM | POA: Diagnosis not present

## 2019-07-29 DIAGNOSIS — C50911 Malignant neoplasm of unspecified site of right female breast: Secondary | ICD-10-CM | POA: Diagnosis not present

## 2019-07-29 DIAGNOSIS — Z853 Personal history of malignant neoplasm of breast: Secondary | ICD-10-CM | POA: Insufficient documentation

## 2019-07-29 DIAGNOSIS — M810 Age-related osteoporosis without current pathological fracture: Secondary | ICD-10-CM | POA: Diagnosis not present

## 2019-07-29 DIAGNOSIS — C50411 Malignant neoplasm of upper-outer quadrant of right female breast: Secondary | ICD-10-CM

## 2019-07-29 LAB — CBC WITH DIFFERENTIAL/PLATELET
Abs Immature Granulocytes: 0.11 10*3/uL — ABNORMAL HIGH (ref 0.00–0.07)
Basophils Absolute: 0.1 10*3/uL (ref 0.0–0.1)
Basophils Relative: 1 %
Eosinophils Absolute: 0.1 10*3/uL (ref 0.0–0.5)
Eosinophils Relative: 1 %
HCT: 42.2 % (ref 36.0–46.0)
Hemoglobin: 13.9 g/dL (ref 12.0–15.0)
Immature Granulocytes: 1 %
Lymphocytes Relative: 19 %
Lymphs Abs: 1.5 10*3/uL (ref 0.7–4.0)
MCH: 29.3 pg (ref 26.0–34.0)
MCHC: 32.9 g/dL (ref 30.0–36.0)
MCV: 88.8 fL (ref 80.0–100.0)
Monocytes Absolute: 0.6 10*3/uL (ref 0.1–1.0)
Monocytes Relative: 8 %
Neutro Abs: 5.4 10*3/uL (ref 1.7–7.7)
Neutrophils Relative %: 70 %
Platelets: 264 10*3/uL (ref 150–400)
RBC: 4.75 MIL/uL (ref 3.87–5.11)
RDW: 14.4 % (ref 11.5–15.5)
WBC: 7.7 10*3/uL (ref 4.0–10.5)
nRBC: 0 % (ref 0.0–0.2)

## 2019-07-29 LAB — COMPREHENSIVE METABOLIC PANEL
ALT: 18 U/L (ref 0–44)
AST: 22 U/L (ref 15–41)
Albumin: 4.4 g/dL (ref 3.5–5.0)
Alkaline Phosphatase: 67 U/L (ref 38–126)
Anion gap: 9 (ref 5–15)
BUN: 13 mg/dL (ref 8–23)
CO2: 25 mmol/L (ref 22–32)
Calcium: 9.5 mg/dL (ref 8.9–10.3)
Chloride: 107 mmol/L (ref 98–111)
Creatinine, Ser: 0.72 mg/dL (ref 0.44–1.00)
GFR calc Af Amer: >60 mL/min (ref >60)
GFR calc non Af Amer: >60 mL/min (ref >60)
Glucose, Bld: 102 mg/dL — ABNORMAL HIGH (ref 70–99)
Potassium: 4.2 mmol/L (ref 3.5–5.1)
Sodium: 141 mmol/L (ref 135–145)
Total Bilirubin: 0.6 mg/dL (ref 0.3–1.2)
Total Protein: 7.5 g/dL (ref 6.5–8.1)

## 2019-07-29 NOTE — Progress Notes (Signed)
No new changes noted today 

## 2019-07-29 NOTE — Patient Instructions (Signed)
Denosumab injection What is this medicine? DENOSUMAB (den oh sue mab) slows bone breakdown. Prolia is used to treat osteoporosis in women after menopause and in men, and in people who are taking corticosteroids for 6 months or more. Xgeva is used to treat a high calcium level due to cancer and to prevent bone fractures and other bone problems caused by multiple myeloma or cancer bone metastases. Xgeva is also used to treat giant cell tumor of the bone. This medicine may be used for other purposes; ask your health care provider or pharmacist if you have questions. COMMON BRAND NAME(S): Prolia, XGEVA What should I tell my health care provider before I take this medicine? They need to know if you have any of these conditions:  dental disease  having surgery or tooth extraction  infection  kidney disease  low levels of calcium or Vitamin D in the blood  malnutrition  on hemodialysis  skin conditions or sensitivity  thyroid or parathyroid disease  an unusual reaction to denosumab, other medicines, foods, dyes, or preservatives  pregnant or trying to get pregnant  breast-feeding How should I use this medicine? This medicine is for injection under the skin. It is given by a health care professional in a hospital or clinic setting. A special MedGuide will be given to you before each treatment. Be sure to read this information carefully each time. For Prolia, talk to your pediatrician regarding the use of this medicine in children. Special care may be needed. For Xgeva, talk to your pediatrician regarding the use of this medicine in children. While this drug may be prescribed for children as young as 13 years for selected conditions, precautions do apply. Overdosage: If you think you have taken too much of this medicine contact a poison control center or emergency room at once. NOTE: This medicine is only for you. Do not share this medicine with others. What if I miss a dose? It is  important not to miss your dose. Call your doctor or health care professional if you are unable to keep an appointment. What may interact with this medicine? Do not take this medicine with any of the following medications:  other medicines containing denosumab This medicine may also interact with the following medications:  medicines that lower your chance of fighting infection  steroid medicines like prednisone or cortisone This list may not describe all possible interactions. Give your health care provider a list of all the medicines, herbs, non-prescription drugs, or dietary supplements you use. Also tell them if you smoke, drink alcohol, or use illegal drugs. Some items may interact with your medicine. What should I watch for while using this medicine? Visit your doctor or health care professional for regular checks on your progress. Your doctor or health care professional may order blood tests and other tests to see how you are doing. Call your doctor or health care professional for advice if you get a fever, chills or sore throat, or other symptoms of a cold or flu. Do not treat yourself. This drug may decrease your body's ability to fight infection. Try to avoid being around people who are sick. You should make sure you get enough calcium and vitamin D while you are taking this medicine, unless your doctor tells you not to. Discuss the foods you eat and the vitamins you take with your health care professional. See your dentist regularly. Brush and floss your teeth as directed. Before you have any dental work done, tell your dentist you are   receiving this medicine. Do not become pregnant while taking this medicine or for 5 months after stopping it. Talk with your doctor or health care professional about your birth control options while taking this medicine. Women should inform their doctor if they wish to become pregnant or think they might be pregnant. There is a potential for serious side  effects to an unborn child. Talk to your health care professional or pharmacist for more information. What side effects may I notice from receiving this medicine? Side effects that you should report to your doctor or health care professional as soon as possible:  allergic reactions like skin rash, itching or hives, swelling of the face, lips, or tongue  bone pain  breathing problems  dizziness  jaw pain, especially after dental work  redness, blistering, peeling of the skin  signs and symptoms of infection like fever or chills; cough; sore throat; pain or trouble passing urine  signs of low calcium like fast heartbeat, muscle cramps or muscle pain; pain, tingling, numbness in the hands or feet; seizures  unusual bleeding or bruising  unusually weak or tired Side effects that usually do not require medical attention (report to your doctor or health care professional if they continue or are bothersome):  constipation  diarrhea  headache  joint pain  loss of appetite  muscle pain  runny nose  tiredness  upset stomach This list may not describe all possible side effects. Call your doctor for medical advice about side effects. You may report side effects to FDA at 1-800-FDA-1088. Where should I keep my medicine? This medicine is only given in a clinic, doctor's office, or other health care setting and will not be stored at home. NOTE: This sheet is a summary. It may not cover all possible information. If you have questions about this medicine, talk to your doctor, pharmacist, or health care provider.  2020 Elsevier/Gold Standard (2018-02-19 16:10:44)

## 2019-07-30 LAB — CANCER ANTIGEN 27.29: CA 27.29: 13.5 U/mL (ref 0.0–38.6)

## 2019-08-05 DIAGNOSIS — M810 Age-related osteoporosis without current pathological fracture: Secondary | ICD-10-CM | POA: Insufficient documentation

## 2019-10-26 ENCOUNTER — Emergency Department: Payer: Medicare Other

## 2019-10-26 ENCOUNTER — Encounter: Payer: Self-pay | Admitting: Emergency Medicine

## 2019-10-26 ENCOUNTER — Emergency Department
Admission: EM | Admit: 2019-10-26 | Discharge: 2019-10-26 | Disposition: A | Discharge status: Home / Self Care | Payer: Medicare Other | Attending: Emergency Medicine | Admitting: Emergency Medicine

## 2019-10-26 DIAGNOSIS — U071 COVID-19: Secondary | ICD-10-CM | POA: Insufficient documentation

## 2019-10-26 DIAGNOSIS — Z853 Personal history of malignant neoplasm of breast: Secondary | ICD-10-CM | POA: Insufficient documentation

## 2019-10-26 DIAGNOSIS — R509 Fever, unspecified: Secondary | ICD-10-CM | POA: Diagnosis not present

## 2019-10-26 DIAGNOSIS — R42 Dizziness and giddiness: Secondary | ICD-10-CM | POA: Diagnosis not present

## 2019-10-26 LAB — CBC
HCT: 40.8 % (ref 36.0–46.0)
Hemoglobin: 13.7 g/dL (ref 12.0–15.0)
MCH: 29.5 pg (ref 26.0–34.0)
MCHC: 33.6 g/dL (ref 30.0–36.0)
MCV: 87.9 fL (ref 80.0–100.0)
Platelets: 170 10*3/uL (ref 150–400)
RBC: 4.64 MIL/uL (ref 3.87–5.11)
RDW: 13.6 % (ref 11.5–15.5)
WBC: 6.2 10*3/uL (ref 4.0–10.5)
nRBC: 0 % (ref 0.0–0.2)

## 2019-10-26 LAB — BASIC METABOLIC PANEL
Anion gap: 15 (ref 5–15)
BUN: 7 mg/dL — ABNORMAL LOW (ref 8–23)
CO2: 16 mmol/L — ABNORMAL LOW (ref 22–32)
Calcium: 8.7 mg/dL — ABNORMAL LOW (ref 8.9–10.3)
Chloride: 105 mmol/L (ref 98–111)
Creatinine, Ser: 0.5 mg/dL (ref 0.44–1.00)
GFR calc Af Amer: >60 mL/min (ref >60)
GFR calc non Af Amer: >60 mL/min (ref >60)
Glucose, Bld: 103 mg/dL — ABNORMAL HIGH (ref 70–99)
Potassium: 3.7 mmol/L (ref 3.5–5.1)
Sodium: 136 mmol/L (ref 135–145)

## 2019-10-26 LAB — URINALYSIS, COMPLETE (UACMP) WITH MICROSCOPIC
Bacteria, UA: NONE SEEN
Bilirubin Urine: NEGATIVE
Glucose, UA: NEGATIVE mg/dL
Ketones, ur: 80 mg/dL — AB
Leukocytes,Ua: NEGATIVE
Nitrite: NEGATIVE
Protein, ur: 30 mg/dL — AB
Specific Gravity, Urine: 1.021 (ref 1.005–1.030)
pH: 6 (ref 5.0–8.0)

## 2019-10-26 LAB — GLUCOSE, CAPILLARY: Glucose-Capillary: 91 mg/dL (ref 70–99)

## 2019-10-26 MED ORDER — MECLIZINE HCL 25 MG PO TABS: 25.0000 mg | ORAL_TABLET | Freq: Three times a day (TID) | ORAL | 0 refills | Status: DC | PRN

## 2019-10-26 MED ORDER — SODIUM CHLORIDE 0.9% FLUSH: 3.0000 mL | Freq: Once | INTRAVENOUS | Status: DC

## 2019-10-26 MED ORDER — SODIUM CHLORIDE 0.9 % IV BOLUS
500.0000 mL | Freq: Once | INTRAVENOUS | Status: AC
  Administered 2019-10-26: 17:00:00 500 mL via INTRAVENOUS

## 2019-10-26 NOTE — ED Provider Notes (Signed)
[facility] Emergency Department Provider Note  ____________________________________________   I have reviewed the triage vital signs and the nursing notes.   HISTORY  Chief Complaint Dizziness   History limited by: Not Limited   HPI Rebecca Faulkner is a 78 y.o. female who presents to the emergency department today because of concerns for episodes of dizziness that occurred last night.  She states she had 4 discrete episodes.  They all occurred when she was lying in bed and tried to turn over.  She describes the room is spinning.  She has not had any further episodes of the room spinning although throughout the day today she has felt somewhat lightheaded.  For the past week she has had a low-grade fever.  She also complains of nasal congestion and a postnasal drip cough.  Patient denies any shortness of breath.  Denies any known Covid contact.  States she had similar episodes of dizziness roughly 1 year ago and was treated for vertigo.   Records reviewed. Per medical record review patient has a history of ER visit roughly 1 year ago and diagnosed with vertigo. Treated with meclizine.   Past Medical History:  Diagnosis Date  . Breast cancer ([place]) 2013   right breast  . Heart murmur 1990  . Hypercholesteremia   . Lump or mass in breast   . Malignant neoplasm of upper-outer quadrant of female breast ([place]) 2013   R-breast T1, NO, MO, ER/PR pos., Her 2 neg.  . Osteoporosis   . Ulcer 1974    Patient Active Problem List   Diagnosis Date Noted  . Osteoporosis 08/05/2019  . Closed comminuted intertrochanteric fracture of proximal femur ([place]) 07/27/2018  . Mass of upper outer quadrant of left breast 06/01/2018  . Goals of care, counseling/discussion 01/31/2018  . Osteoporosis without current pathological fracture 07/28/2017  . Depression, major, recurrent, mild ([place]) 07/07/2017  . B12 deficiency 07/07/2017  . Vitamin D deficiency 07/07/2017  . Screening for  colon cancer 05/27/2017  . Malignant neoplasm of upper-outer quadrant of right breast in female, estrogen receptor positive ([place]) 05/27/2017  . Glaucoma 05/26/2017  . History of alcoholism ([place]) 05/26/2017  . History of hip fracture 05/10/2015  . Breast cancer, right ([place]) 05/27/2012  . Estrogen receptor positive status (ER+) 05/14/2012    Past Surgical History:  Procedure Laterality Date  . BREAST SURGERY Right 2013  . CATARACT EXTRACTION W/PHACO Right 04/23/2016   Procedure: CATARACT EXTRACTION PHACO AND INTRAOCULAR LENS PLACEMENT ([place]) RIGHT EYE;  Surgeon: Leandrew Koyanagi, MD;  Location: [place];  Service: Ophthalmology;  Laterality: Right;  . CATARACT EXTRACTION W/PHACO Left 06/04/2016   Procedure: CATARACT EXTRACTION PHACO AND INTRAOCULAR LENS PLACEMENT (IOC);  Surgeon: Leandrew Koyanagi, MD;  Location: [place];  Service: Ophthalmology;  Laterality: Left;  . [place]  . COLONOSCOPY WITH PROPOFOL N/A 07/22/2017   Procedure: COLONOSCOPY WITH PROPOFOL;  Surgeon: Robert Bellow, MD;  Location: [facility] ENDOSCOPY;  Service: Endoscopy;  Laterality: N/A;  . FRACTURE SURGERY Left 2011   arm, plate and 12 screws  . LEG SURGERY Right 01/02/2015  . MASTECTOMY Right 2013  . Right inguinal hernia repair  2013  . VEIN SURGERY Left 2015    Prior to Admission medications   Medication Sig Start Date End Date Taking? Authorizing Provider  alendronate (FOSAMAX) 70 MG tablet Take 1 tablet (70 mg total) by mouth once a week. 01/26/18   Karen Kitchens, NP  calcium carbonate (OS-CAL) 600 MG  TABS Take 600 mg by mouth 2 (two) times daily with a meal.    [provider]  meclizine (ANTIVERT) 25 MG tablet Take 1 tablet (25 mg total) by mouth 3 (three) times daily as needed for dizziness or nausea. Patient not taking: Reported on 07/29/2019 10/26/18   Earleen Newport, MD  Vitamin D, Ergocalciferol, (DRISDOL) 50000 units CAPS capsule Take 1 capsule (50,000  Units total) by mouth every 7 (seven) days. 07/10/17   Steele Sizer, MD    Allergies Codeine  Family History  Problem Relation Age of Onset  . Dementia Mother   . Stroke Father   . Diabetes Daughter   . Hypertension Daughter   . Breast cancer Neg Hx     Social History Social History   Tobacco Use  . Smoking status: Never Smoker  . Smokeless tobacco: Never Used  Substance Use Topics  . Alcohol use: No    Comment: Quit drinking  11/04/2016  . Drug use: No    Review of Systems Constitutional: Positive for low grade fevers.  Eyes: No visual changes. ENT: Positive for nasal congestion.  Cardiovascular: Denies chest pain. Respiratory: Denies shortness of breath. Positive for cough.  Gastrointestinal: No abdominal pain.  No nausea, no vomiting.  No diarrhea.   Genitourinary: Negative for dysuria. Musculoskeletal: Negative for back pain. Skin: Negative for rash. Neurological: Positive for dizziness.  ____________________________________________   PHYSICAL EXAM:  VITAL SIGNS: ED Triage Vitals [10/26/19 1133]  Enc Vitals Group     BP 134/78     Pulse Rate (!) 102     Resp 16     Temp 97.8 F (36.6 C)     Temp Source Oral     SpO2 100 %     Weight 140 lb (63.5 kg)     Height 5\' 1"  (1.549 m)     Head Circumference      Peak Flow      Pain Score 0   Constitutional: Alert and oriented.  Eyes: Conjunctivae are normal.  ENT      Head: Normocephalic and atraumatic.      Nose: No congestion/rhinnorhea.      Mouth/Throat: Mucous membranes are moist.      Neck: No stridor. Hematological/Lymphatic/Immunilogical: No cervical lymphadenopathy. Cardiovascular: Normal rate, regular rhythm.  No murmurs, rubs, or gallops.  Respiratory: Normal respiratory effort without tachypnea nor retractions. Breath sounds are clear and equal bilaterally. No wheezes/rales/rhonchi. Gastrointestinal: Soft and non tender. No rebound. No guarding.  Genitourinary: Deferred Musculoskeletal:  Normal range of motion in all extremities. No lower extremity edema. Neurologic:  Normal speech and language. No gross focal neurologic deficits are appreciated.  Skin:  Skin is warm, dry and intact. No rash noted. Psychiatric: Mood and affect are normal. Speech and behavior are normal. Patient exhibits appropriate insight and judgment.  ____________________________________________    LABS (pertinent positives/negatives)  CBC wbc 6.2, hgb 13.7, plt 170 BMP na 136, k 3.7, glu 103, bun 7, cr 0.50 UA hazy, ketones 80, nitrite negative, rbc 11-20, wbc 0-5, bacteria none seen ____________________________________________   EKG  I, Nance Pear, attending physician, personally viewed and interpreted this EKG  EKG Time: 1141 Rate: 99 Rhythm: normal sinus rhythm Axis: normal Intervals: qtc 441 QRS: narrow, q waves III, aVF ST changes: no st elevation Impression: abnormal ekg ____________________________________________    RADIOLOGY  CXR No acute abnormality  ____________________________________________   PROCEDURES  Procedures  ____________________________________________   INITIAL IMPRESSION / ASSESSMENT AND PLAN / ED COURSE  Pertinent labs & imaging results that were available during my care of the patient were reviewed by me and considered in my medical decision making (see chart for details).   Patient presents to the emergency department tonight because of concern for multiple episodes of dizziness that occurred last night. Patient with history of vertigo and symptoms do sound vertiginous. At the time of my exam patient just complaining of lightheadedness. The patient blood work without any concerning findings. Given history of vertigo will plan on giving prescription for meclizine. Patient was given IV fluids here and dif eel some improvement in her lightheadedness. Did send covid swab given history of low grade fevers.    ____________________________________________   FINAL CLINICAL IMPRESSION(S) / ED DIAGNOSES  Final diagnoses:  Dizziness  Vertigo     Note: This dictation was prepared with Dragon dictation. Any transcriptional errors that result from this process are unintentional     Nance Pear, MD 10/26/19 1819

## 2019-10-26 NOTE — ED Triage Notes (Signed)
C/O low grade fever x 4 days.  States last night had 4 episodes of vertigo, while laying down.  Denies current dizziness, c/o lightheadedness.  AAOx3.  Skin warm and dry. NAD

## 2019-10-26 NOTE — Discharge Instructions (Addendum)
Please seek medical attention for any high fevers, chest pain, shortness of breath, change in behavior, persistent vomiting, bloody stool or any other new or concerning symptoms.  

## 2019-10-27 ENCOUNTER — Telehealth: Payer: Self-pay | Admitting: Emergency Medicine

## 2019-10-27 LAB — NOVEL CORONAVIRUS, NAA (HOSP ORDER, SEND-OUT TO REF LAB; TAT 18-24 HRS): SARS-CoV-2, NAA: DETECTED — AB

## 2019-10-27 NOTE — Telephone Encounter (Signed)
Patient called me back. I gave her result and cdc guidelines for isolation.

## 2019-10-27 NOTE — Telephone Encounter (Signed)
Called patient to assure she is aware of positive covid test.  No answer. Left message.

## 2019-11-03 ENCOUNTER — Encounter: Payer: Self-pay | Admitting: Family Medicine

## 2019-11-03 ENCOUNTER — Ambulatory Visit (INDEPENDENT_AMBULATORY_CARE_PROVIDER_SITE_OTHER): Payer: Medicare Other | Admitting: Family Medicine

## 2019-11-03 DIAGNOSIS — Z8616 Personal history of COVID-19: Secondary | ICD-10-CM

## 2019-11-03 DIAGNOSIS — E559 Vitamin D deficiency, unspecified: Secondary | ICD-10-CM

## 2019-11-03 DIAGNOSIS — M810 Age-related osteoporosis without current pathological fracture: Secondary | ICD-10-CM

## 2019-11-03 DIAGNOSIS — R42 Dizziness and giddiness: Secondary | ICD-10-CM | POA: Diagnosis not present

## 2019-11-03 NOTE — Progress Notes (Signed)
Name: Rebecca Faulkner   MRN: [id number]    DOB: 1941-06-25   Date:11/03/2019       Progress Note  Subjective  Chief Complaint  Chief Complaint  Patient presents with  . Hospitalization Follow-up    Evaluated for Dizziness or Vertigo. She continues to take her medication as prescribed. She is feeling alot better.     I connected with  Rebecca Faulkner on 11/03/19 at 11:00 AM EST by telephone and verified that I am speaking with the correct person using two identifiers.  I discussed the limitations, risks, security and privacy concerns of performing an evaluation and management service by telephone and the availability of in person appointments. Staff also discussed with the patient that there may be a patient responsible charge related to this service. Patient Location: at groceries store Provider Location: [facility]   HPI  EC follow up: she went for dizziness, diagnosed with Vertigo and given Meclizine. She had a positive COVID-19 test when she was at [facility], she has been followed by health department and already released from [facility]. She states only had a low grade fever for about one day the week before Christmas and lack of sense of taste and smell, otherwise no problems. She states appetite is normal, no fatigue , cough or sob. Vertigo happens during the night when she rolls in bed, but no episodes in the past 4 days. Advised to back off on meclizine and take it prn only , also discussed referral to ENT for Epley Maneuver  Osteoporosis: she is due for bone density, needs to resume taking calcium and vitamin . She stopped Alendronate on her own and we will resume pending results of bone density and also needs repeat labs   Patient Active Problem List   Diagnosis Date Noted  . Osteoporosis 08/05/2019  . Closed comminuted intertrochanteric fracture of proximal femur ([place]) 07/27/2018  . Mass of upper outer quadrant of left breast 06/01/2018  . Goals of care,  counseling/discussion 01/31/2018  . Osteoporosis without current pathological fracture 07/28/2017  . Depression, major, recurrent, mild ([place]) 07/07/2017  . B12 deficiency 07/07/2017  . Vitamin D deficiency 07/07/2017  . Screening for colon cancer 05/27/2017  . Malignant neoplasm of upper-outer quadrant of right breast in female, estrogen receptor positive ([place]) 05/27/2017  . Glaucoma 05/26/2017  . History of alcoholism ([place]) 05/26/2017  . History of hip fracture 05/10/2015  . Breast cancer, right ([place]) 05/27/2012  . Estrogen receptor positive status (ER+) 05/14/2012    Past Surgical History:  Procedure Laterality Date  . BREAST SURGERY Right 2013  . CATARACT EXTRACTION W/PHACO Right 04/23/2016   Procedure: CATARACT EXTRACTION PHACO AND INTRAOCULAR LENS PLACEMENT ([place]) RIGHT EYE;  Surgeon: Leandrew Koyanagi, MD;  Location: [place];  Service: Ophthalmology;  Laterality: Right;  . CATARACT EXTRACTION W/PHACO Left 06/04/2016   Procedure: CATARACT EXTRACTION PHACO AND INTRAOCULAR LENS PLACEMENT (I[facility]);  Surgeon: Leandrew Koyanagi, MD;  Location: [place];  Service: Ophthalmology;  Laterality: Left;  . [place]  . COLONOSCOPY WITH PROPOFOL N/A 07/22/2017   Procedure: COLONOSCOPY WITH PROPOFOL;  Surgeon: Robert Bellow, MD;  Location: [facility] ENDOSCOPY;  Service: Endoscopy;  Laterality: N/A;  . FRACTURE SURGERY Left 2011   arm, plate and 12 screws  . LEG SURGERY Right 01/02/2015  . MASTECTOMY Right 2013  . Right inguinal hernia repair  2013  . VEIN SURGERY Left 2015    Family History  Problem Relation Age of Onset  .  Dementia Mother   . Stroke Father   . Diabetes Daughter   . Hypertension Daughter   . Breast cancer Neg Hx       Current Outpatient Medications:  .  meclizine (ANTIVERT) 25 MG tablet, Take 1 tablet (25 mg total) by mouth 3 (three) times daily as needed for dizziness., Disp: 30 tablet, Rfl: 0 .  alendronate (FOSAMAX) 70 MG tablet,  Take 1 tablet (70 mg total) by mouth once a week. (Patient not taking: Reported on 11/03/2019), Disp: 12 tablet, Rfl: 2 .  calcium carbonate (OS-CAL) 600 MG TABS, Take 600 mg by mouth 2 (two) times daily with a meal., Disp: , Rfl:  .  meclizine (ANTIVERT) 25 MG tablet, Take 1 tablet (25 mg total) by mouth 3 (three) times daily as needed for dizziness or nausea. (Patient not taking: Reported on 07/29/2019), Disp: 30 tablet, Rfl: 1 .  Vitamin D, Ergocalciferol, (DRISDOL) 50000 units CAPS capsule, Take 1 capsule (50,000 Units total) by mouth every 7 (seven) days. (Patient not taking: Reported on 11/03/2019), Disp: 12 capsule, Rfl: 0  Allergies  Allergen Reactions  . Codeine Nausea Only    I personally reviewed active problem list, medication list, allergies, family history with the patient/caregiver today.   ROS  Ten systems reviewed and is negative except as mentioned in HPI   Objective  Virtual encounter, vitals not obtained.   There is no height or weight on file to calculate BMI.  Physical Exam   Awake, alert and oriented   PHQ2/9: Depression screen [facility] 2/9 11/03/2019 07/07/2017 05/26/2017 05/26/2017  Decreased Interest 0 0 1 0  Down, Depressed, Hopeless 0 2 3 1   PHQ - 2 Score 0 2 4 1   Altered sleeping 0 0 0 -  Tired, decreased energy 0 3 3 -  Change in appetite 0 0 0 -  Feeling bad or failure about yourself  0 0 3 -  Trouble concentrating 0 0 0 -  Moving slowly or fidgety/restless 0 0 0 -  Suicidal thoughts 0 0 0 -  PHQ-9 Score 0 5 10 -  Difficult doing work/chores - Not difficult at all - -   PHQ-2/9 Result is negative.    Fall Risk: Fall Risk  05/26/2017  Falls in the past year? No     Assessment & Plan  1. Vertigo  Discussed referral to ENT  2. History of COVID-19  Still has lack of sense of smell and taste   3. Low calcium levels  Recheck next visit, advised to resume supplementation   4. Age-related osteoporosis without current pathological  fracture  She stopped taking Alendronate, she is going to have repeat bone density / already ordered   5. Vitamin D deficiency  Resume supplementation, history of fracture  I discussed the assessment and treatment plan with the patient. The patient was provided an opportunity to ask questions and all were answered. The patient agreed with the plan and demonstrated an understanding of the instructions.   The patient was advised to call back or seek an in-person evaluation if the symptoms worsen or if the condition fails to improve as anticipated.  I provided 15  minutes of non-face-to-face time during this encounter.  [name], MD

## 2020-01-17 ENCOUNTER — Ambulatory Visit
Admission: RE | Admit: 2020-01-17 | Discharge: 2020-01-17 | Disposition: A | Discharge status: Home / Self Care | Payer: Medicare Other | Source: Ambulatory Visit | Attending: Hematology and Oncology | Admitting: Hematology and Oncology

## 2020-01-17 DIAGNOSIS — R928 Other abnormal and inconclusive findings on diagnostic imaging of breast: Secondary | ICD-10-CM | POA: Insufficient documentation

## 2020-01-17 DIAGNOSIS — Z853 Personal history of malignant neoplasm of breast: Secondary | ICD-10-CM | POA: Diagnosis not present

## 2020-01-17 DIAGNOSIS — Z9011 Acquired absence of right breast and nipple: Secondary | ICD-10-CM | POA: Insufficient documentation

## 2020-01-17 DIAGNOSIS — C50911 Malignant neoplasm of unspecified site of right female breast: Secondary | ICD-10-CM

## 2020-01-26 ENCOUNTER — Other Ambulatory Visit: Payer: Self-pay | Admitting: Family Medicine

## 2020-01-26 NOTE — Telephone Encounter (Signed)
Requested medication (s) are due for refill today: yes  Requested medication (s) are on the active medication list: yes   Future visit scheduled: yes  Notes to clinic:  this refill cannot be delegated    Requested Prescriptions  Pending Prescriptions Disp Refills   meclizine (ANTIVERT) 25 MG tablet 30 tablet 0    Sig: Take 1 tablet (25 mg total) by mouth 3 (three) times daily as needed for dizziness.      Not Delegated - Gastroenterology: Antiemetics Failed - 01/26/2020  8:51 AM      Failed - This refill cannot be delegated      Failed - Valid encounter within last 6 months    Recent Outpatient Visits           2 months ago [facility] Steele Sizer, MD   2 years ago Depression, major, recurrent, mild [facility])   [facility] [place], Drue Stager, MD   2 years ago Malignant neoplasm of right breast in female, estrogen receptor positive, unspecified site of breast [facility])   [facility] Steele Sizer, MD       Future Appointments             In 3 months  [facility], [place]   In 4 months Steele Sizer, MD [facility], [facility]

## 2020-01-26 NOTE — Telephone Encounter (Signed)
Medication Refill - meclizine (ANTIVERT) 25 MG tablet    Preferred Pharmacy (with phone number or street name):  [facility] [place] Phone:  [phone]  Fax:  [phone]       Agent: Please be advised that RX refills may take up to 3 business days. We ask that you follow-up with your pharmacy.

## 2020-01-31 MED ORDER — MECLIZINE HCL 25 MG PO TABS: 25.0000 mg | ORAL_TABLET | Freq: Three times a day (TID) | ORAL | 0 refills | Status: DC | PRN

## 2020-01-31 NOTE — Telephone Encounter (Signed)
Copied from [place] [phone]. Topic: General - Other >> Jan 31, 2020 10:49 AM Rainey Pines A wrote: Patient would like a callback from nurse in regards to medication for dizziness that she requested last week that is sstill not at pharmacy Please advise

## 2020-02-02 ENCOUNTER — Ambulatory Visit: Payer: Medicare Other

## 2020-03-07 ENCOUNTER — Emergency Department
Admission: EM | Admit: 2020-03-07 | Discharge: 2020-03-07 | Disposition: A | Discharge status: Home / Self Care | Payer: Medicare Other | Attending: Student | Admitting: Student

## 2020-03-07 ENCOUNTER — Emergency Department: Payer: Medicare Other

## 2020-03-07 ENCOUNTER — Encounter: Payer: Self-pay | Admitting: Emergency Medicine

## 2020-03-07 ENCOUNTER — Other Ambulatory Visit: Payer: Self-pay

## 2020-03-07 DIAGNOSIS — N83202 Unspecified ovarian cyst, left side: Secondary | ICD-10-CM | POA: Diagnosis not present

## 2020-03-07 DIAGNOSIS — R52 Pain, unspecified: Secondary | ICD-10-CM

## 2020-03-07 DIAGNOSIS — R110 Nausea: Secondary | ICD-10-CM

## 2020-03-07 DIAGNOSIS — R6883 Chills (without fever): Secondary | ICD-10-CM

## 2020-03-07 DIAGNOSIS — K5732 Diverticulitis of large intestine without perforation or abscess without bleeding: Secondary | ICD-10-CM | POA: Diagnosis not present

## 2020-03-07 DIAGNOSIS — N949 Unspecified condition associated with female genital organs and menstrual cycle: Secondary | ICD-10-CM

## 2020-03-07 DIAGNOSIS — N838 Other noninflammatory disorders of ovary, fallopian tube and broad ligament: Secondary | ICD-10-CM | POA: Diagnosis not present

## 2020-03-07 DIAGNOSIS — R197 Diarrhea, unspecified: Secondary | ICD-10-CM

## 2020-03-07 DIAGNOSIS — M7918 Myalgia, other site: Secondary | ICD-10-CM | POA: Insufficient documentation

## 2020-03-07 DIAGNOSIS — M791 Myalgia, unspecified site: Secondary | ICD-10-CM | POA: Diagnosis not present

## 2020-03-07 DIAGNOSIS — K5792 Diverticulitis of intestine, part unspecified, without perforation or abscess without bleeding: Secondary | ICD-10-CM | POA: Diagnosis not present

## 2020-03-07 LAB — COMPREHENSIVE METABOLIC PANEL
ALT: 15 U/L (ref 0–44)
AST: 20 U/L (ref 15–41)
Albumin: 4.3 g/dL (ref 3.5–5.0)
Alkaline Phosphatase: 67 U/L (ref 38–126)
Anion gap: 9 (ref 5–15)
BUN: 14 mg/dL (ref 8–23)
CO2: 19 mmol/L — ABNORMAL LOW (ref 22–32)
Calcium: 9.1 mg/dL (ref 8.9–10.3)
Chloride: 108 mmol/L (ref 98–111)
Creatinine, Ser: 0.59 mg/dL (ref 0.44–1.00)
GFR calc Af Amer: >60 mL/min (ref >60)
GFR calc non Af Amer: >60 mL/min (ref >60)
Glucose, Bld: 113 mg/dL — ABNORMAL HIGH (ref 70–99)
Potassium: 3.8 mmol/L (ref 3.5–5.1)
Sodium: 136 mmol/L (ref 135–145)
Total Bilirubin: 0.5 mg/dL (ref 0.3–1.2)
Total Protein: 7.5 g/dL (ref 6.5–8.1)

## 2020-03-07 LAB — CBC
HCT: 43.6 % (ref 36.0–46.0)
Hemoglobin: 14.4 g/dL (ref 12.0–15.0)
MCH: 29.2 pg (ref 26.0–34.0)
MCHC: 33 g/dL (ref 30.0–36.0)
MCV: 88.4 fL (ref 80.0–100.0)
Platelets: 214 10*3/uL (ref 150–400)
RBC: 4.93 MIL/uL (ref 3.87–5.11)
RDW: 13.9 % (ref 11.5–15.5)
WBC: 8.1 10*3/uL (ref 4.0–10.5)
nRBC: 0 % (ref 0.0–0.2)

## 2020-03-07 LAB — LIPASE, BLOOD: Lipase: 39 U/L (ref 11–51)

## 2020-03-07 MED ORDER — SODIUM CHLORIDE 0.9% FLUSH: 3.0000 mL | Freq: Once | INTRAVENOUS | Status: DC

## 2020-03-07 MED ORDER — IOHEXOL 300 MG/ML  SOLN
100.0000 mL | Freq: Once | INTRAMUSCULAR | Status: AC | PRN
  Administered 2020-03-07: 100 mL via INTRAVENOUS

## 2020-03-07 MED ORDER — CIPROFLOXACIN HCL 500 MG PO TABS
500.0000 mg | ORAL_TABLET | Freq: Two times a day (BID) | ORAL | 0 refills | Status: AC
Start: 2020-03-07 — End: 2020-03-17

## 2020-03-07 MED ORDER — ONDANSETRON HCL 4 MG/2ML IJ SOLN
4.0000 mg | Freq: Once | INTRAMUSCULAR | Status: AC
  Administered 2020-03-07: 4 mg via INTRAVENOUS
  Filled 2020-03-07: qty 2

## 2020-03-07 MED ORDER — SODIUM CHLORIDE 0.9 % IV BOLUS
1000.0000 mL | Freq: Once | INTRAVENOUS | Status: AC
  Administered 2020-03-07: 1000 mL via INTRAVENOUS

## 2020-03-07 MED ORDER — KETOROLAC TROMETHAMINE 30 MG/ML IJ SOLN
15.0000 mg | Freq: Once | INTRAMUSCULAR | Status: AC
  Administered 2020-03-07: 15 mg via INTRAVENOUS
  Filled 2020-03-07: qty 1

## 2020-03-07 MED ORDER — CIPROFLOXACIN HCL 500 MG PO TABS
500.0000 mg | ORAL_TABLET | Freq: Once | ORAL | Status: AC
  Administered 2020-03-07: 500 mg via ORAL
  Filled 2020-03-07: qty 1

## 2020-03-07 MED ORDER — METRONIDAZOLE 500 MG PO TABS
500.0000 mg | ORAL_TABLET | Freq: Once | ORAL | Status: AC
  Administered 2020-03-07: 500 mg via ORAL
  Filled 2020-03-07: qty 1

## 2020-03-07 MED ORDER — METRONIDAZOLE 500 MG PO TABS
500.0000 mg | ORAL_TABLET | Freq: Three times a day (TID) | ORAL | 0 refills | Status: AC
Start: 2020-03-07 — End: 2020-03-17

## 2020-03-07 NOTE — ED Triage Notes (Signed)
States woke this morning and had one episode of diarrhea.  Went onto work where she began feeling chills and body aches.  AAOx3. Skin warm and dry. NAD

## 2020-03-07 NOTE — Discharge Instructions (Addendum)
Thank you for letting us take care of you in the emergency department today.   Please continue to take any regular, prescribed medications.   New medications we have prescribed:  Ciprofloxacin Metronidazole  Please follow up with: Your primary care doctor to review your ER visit and follow up on your symptoms.  OBGYN doctor to follow up on your ovarian cyst, as we discussed   Please return to the ER for any new or worsening symptoms.

## 2020-03-07 NOTE — ED Provider Notes (Signed)
[facility] Emergency Department Provider Note  ____________________________________________   First MD Initiated Contact with Patient 03/07/20 [phone]     (approximate)  I have reviewed the triage vital signs and the nursing notes.  History  Chief Complaint Diarrhea and Generalized Body Aches    HPI Rebecca Faulkner is a 79 y.o. female past medical history as below who presents to the emergency department for body aches, chills, nausea, diarrhea.  Patient states symptoms all started this morning and have been constant since onset.  She does note that she ate at [employer] last night, which she normally does not do, and is wondering if her symptoms may be related to this.  As noted above, she describes body aches/joint pains, nausea without vomiting, and an episode of diarrhea.  Denies any pain at present.  No fevers, cough, shortness of breath.  No sick contacts.  She has been fully vaccinated against COVID.  Has not tried anything for her symptoms.  Nothing seems to make it better or worse.   Past Medical Hx Past Medical History:  Diagnosis Date  . Breast cancer ([place]) 2013   right breast  . Heart murmur 1990  . Hypercholesteremia   . Lump or mass in breast   . Malignant neoplasm of upper-outer quadrant of female breast ([place]) 2013   R-breast T1, NO, MO, ER/PR pos., Her 2 neg.  . Osteoporosis   . Ulcer 1974    Problem List Patient Active Problem List   Diagnosis Date Noted  . Osteoporosis 08/05/2019  . Closed comminuted intertrochanteric fracture of proximal femur ([place]) 07/27/2018  . Mass of upper outer quadrant of left breast 06/01/2018  . Goals of care, counseling/discussion 01/31/2018  . Osteoporosis without current pathological fracture 07/28/2017  . Depression, major, recurrent, mild ([place]) 07/07/2017  . B12 deficiency 07/07/2017  . Vitamin D deficiency 07/07/2017  . Screening for colon cancer 05/27/2017  . Malignant neoplasm of upper-outer  quadrant of right breast in female, estrogen receptor positive ([place]) 05/27/2017  . Glaucoma 05/26/2017  . History of alcoholism ([place]) 05/26/2017  . History of hip fracture 05/10/2015  . Breast cancer, right ([place]) 05/27/2012  . Estrogen receptor positive status (ER+) 05/14/2012    Past Surgical Hx Past Surgical History:  Procedure Laterality Date  . BREAST SURGERY Right 2013  . CATARACT EXTRACTION W/PHACO Right 04/23/2016   Procedure: CATARACT EXTRACTION PHACO AND INTRAOCULAR LENS PLACEMENT ([place]) RIGHT EYE;  Surgeon: Leandrew Koyanagi, MD;  Location: [place];  Service: Ophthalmology;  Laterality: Right;  . CATARACT EXTRACTION W/PHACO Left 06/04/2016   Procedure: CATARACT EXTRACTION PHACO AND INTRAOCULAR LENS PLACEMENT (IOC);  Surgeon: Leandrew Koyanagi, MD;  Location: [place];  Service: Ophthalmology;  Laterality: Left;  . [place]  . COLONOSCOPY WITH PROPOFOL N/A 07/22/2017   Procedure: COLONOSCOPY WITH PROPOFOL;  Surgeon: Robert Bellow, MD;  Location: [facility] ENDOSCOPY;  Service: Endoscopy;  Laterality: N/A;  . FRACTURE SURGERY Left 2011   arm, plate and 12 screws  . LEG SURGERY Right 01/02/2015  . MASTECTOMY Right 2013  . Right inguinal hernia repair  2013  . VEIN SURGERY Left 2015    Medications Prior to Admission medications   Medication Sig Start Date End Date Taking? Authorizing Provider  calcium carbonate (OS-CAL) 600 MG TABS Take 600 mg by mouth 2 (two) times daily with a meal.    [provider]  meclizine (ANTIVERT) 25 MG tablet Take 1 tablet (25 mg total) by mouth 3 (  three) times daily as needed for dizziness. 01/31/20   Steele Sizer, MD    Allergies Codeine  Family Hx Family History  Problem Relation Age of Onset  . Dementia Mother   . Stroke Father   . Diabetes Daughter   . Hypertension Daughter   . Breast cancer Neg Hx     Social Hx Social History   Tobacco Use  . Smoking status: Never Smoker  .  Smokeless tobacco: Never Used  Substance Use Topics  . Alcohol use: No    Comment: Quit drinking  11/04/2016  . Drug use: No     Review of Systems  Constitutional: Positive for chills.  Positive for joint pains, body aches. Eyes: Negative for visual changes. ENT: Negative for sore throat. Cardiovascular: Negative for chest pain. Respiratory: Negative for shortness of breath. Gastrointestinal: Positive for nausea, diarrhea. Genitourinary: Negative for dysuria. Musculoskeletal: Negative for leg swelling. Skin: Negative for rash. Neurological: Negative for headaches.   Physical Exam  Vital Signs: ED Triage Vitals  Enc Vitals Group     BP 03/07/20 0844 128/79     Pulse Rate 03/07/20 0844 99     Resp 03/07/20 0844 16     Temp 03/07/20 0844 98.2 F (36.8 C)     Temp Source 03/07/20 0844 Oral     SpO2 03/07/20 0844 97 %     Weight 03/07/20 0843 139 lb 15.9 oz (63.5 kg)     Height 03/07/20 0843 5\' 1"  (1.549 m)     Head Circumference --      Peak Flow --      Pain Score 03/07/20 0843 7     Pain Loc --      Pain Edu? --      Excl. in [place]? --     Constitutional: Alert and oriented. Well appearing. NAD.  Head: Normocephalic. Atraumatic. Eyes: Conjunctivae clear. Sclera anicteric. Pupils equal and symmetric. Nose: No masses or lesions. No congestion or rhinorrhea. Mouth/Throat: Wearing mask.  Neck: No stridor. Trachea midline.  Cardiovascular: Normal rate, regular rhythm. Extremities well perfused. Respiratory: Normal respiratory effort.  Lungs CTAB. Gastrointestinal: Soft. Non-distended. Non-tender.  Genitourinary: Deferred. Musculoskeletal: No lower extremity edema. No deformities. Neurologic:  Normal speech and language. No gross focal or lateralizing neurologic deficits are appreciated.  Skin: Skin is warm, dry and intact. No rash noted. Psychiatric: Mood and affect are appropriate for situation.    Radiology  Personally reviewed available imaging myself.   CT -  IMPRESSION:  1. Very mild acute sigmoid diverticulitis. No evidence of  perforation or abscess.  2. 4.0 cm benign-appearing cyst in the left adnexa. Given patient's  age and size of the cyst, further evaluation with pelvic ultrasound  is recommended. This recommendation follows ACR consensus  guidelines: White Paper of the ACR Incidental Findings Committee II  on Adnexal Findings. J Am Coll Radiol 2013:10:675-681.  3. Small pericardial effusion.  4. Aortic Atherosclerosis (ICD10-I70.0).    Procedures  Procedure(s) performed (including critical care):  Procedures   Initial Impression / Assessment and Plan / MDM / ED Course  79 y.o. female who presents to the ED for body aches, nausea, diarrhea after eating a [employer] yesterday  Ddx: gastroenteritis secondary to food poisoning, colitis, diverticulitis.  Less likely COVID as she has been fully vaccinated.  Will plan for labs, IV fluids and symptom control, imaging  Clinical Course as of Mar 08 1431  Wed Mar 07, 2020  1207 CT scan reveals very mild acute sigmoid diverticulitis,  no evidence of perforation or abscess.  We will plan for first dose of antibiotics here and then anticipate discharge.  Also seen on CT is a 4 cm benign-appearing cyst in the left adnexa.  Will update patient on this incidental finding and need for follow-up as an outpatient.   [SM]  1245 Patient tolerated by mouth medications without difficulty, reports feeling improved.  Updated her on results and advised outpatient follow-up with her PCP regarding her diverticulitis as well as OB/GYN regarding the adnexal cyst.  She voices understanding of both and is in agreement with the plan.  Given return precautions.   [SM]    Clinical Course User Index [SM] Lilia Pro., MD     _______________________________   As part of my medical decision making I have reviewed available labs, radiology tests, reviewed old records/performed chart review.    Final  Clinical Impression(s) / ED Diagnosis  Body aches Chills Nausea Diarrhea in adult Diverticulitis Adnexal cyst    Note:  This document was prepared using Dragon voice recognition software and may include unintentional dictation errors.   Lilia Pro., MD 03/07/20 1432

## 2020-03-07 NOTE — ED Notes (Signed)
Pt back to room from CT

## 2020-03-12 ENCOUNTER — Other Ambulatory Visit: Payer: Self-pay

## 2020-03-12 ENCOUNTER — Ambulatory Visit (INDEPENDENT_AMBULATORY_CARE_PROVIDER_SITE_OTHER): Payer: Medicare Other | Admitting: Family Medicine

## 2020-03-12 ENCOUNTER — Encounter: Payer: Self-pay | Admitting: Family Medicine

## 2020-03-12 DIAGNOSIS — I700 Atherosclerosis of aorta: Secondary | ICD-10-CM | POA: Insufficient documentation

## 2020-03-12 DIAGNOSIS — N83202 Unspecified ovarian cyst, left side: Secondary | ICD-10-CM | POA: Insufficient documentation

## 2020-03-12 DIAGNOSIS — Z8719 Personal history of other diseases of the digestive system: Secondary | ICD-10-CM | POA: Insufficient documentation

## 2020-03-12 DIAGNOSIS — K5792 Diverticulitis of intestine, part unspecified, without perforation or abscess without bleeding: Secondary | ICD-10-CM | POA: Diagnosis not present

## 2020-03-12 MED ORDER — PRAVASTATIN SODIUM 40 MG PO TABS: 40.0000 mg | ORAL_TABLET | Freq: Every day | ORAL | 1 refills | Status: DC

## 2020-03-12 NOTE — Progress Notes (Signed)
Name: Rebecca Faulkner   MRN: [id number]    DOB: [birth year]   Date:03/12/2020       Progress Note  Subjective  Chief Complaint  Chief Complaint  Patient presents with  . Hospitalization Follow-up    I connected with  [name] on 03/12/20 at  9:00 AM EDT by telephone and verified that I am speaking with the correct person using two identifiers.  I discussed the limitations, risks, security and privacy concerns of performing an evaluation and management service by telephone and the availability of in person appointments. Staff also discussed with the patient that there may be a patient responsible charge related to this service. Patient Location: at home  Provider Location: [facility]   HPI  EC follow up: patient developed fatigue, body aches and diarrhea on 03/07/2020. She was diagnosed with diverticulitis , she was given antibiotics at the [facility], she is still taking Metronidazole and Cipro. She states she is feeling much better now. No fever but she has noticed some chills.. She still has intermittent loose stools. She has been working through it , body ache is better. She denies nausea or vomiting, she has poor appetite but has been eating a bland diet . Discussed incidental findings of atherosclerosis of aorta and also left  ovarian cyst. She tried scheduling an appointment with gyn, explained that if she sees Dr. Su Grand she may have US done in her office, otherwise get it as an outpatient      Patient Active Problem List   Diagnosis Date Noted  . Osteoporosis 08/05/2019  . Closed comminuted intertrochanteric fracture of proximal femur ([place]) 07/27/2018  . Mass of upper outer quadrant of left breast 06/01/2018  . Goals of care, counseling/discussion 01/31/2018  . Osteoporosis without current pathological fracture 07/28/2017  . Depression, major, recurrent, mild ([place]) 07/07/2017  . B12 deficiency 07/07/2017  . Vitamin D deficiency 07/07/2017  . Screening for colon cancer 05/27/2017    . Malignant neoplasm of upper-outer quadrant of right breast in female, estrogen receptor positive ([place]) 05/27/2017  . Glaucoma 05/26/2017  . History of alcoholism ([place]) 05/26/2017  . History of hip fracture 05/10/2015  . Breast cancer, right ([place]) 05/27/2012  . Estrogen receptor positive status (ER+) 05/14/2012    Past Surgical History:  Procedure Laterality Date  . BREAST SURGERY Right 2013  . CATARACT EXTRACTION W/PHACO Right 04/23/2016   Procedure: CATARACT EXTRACTION PHACO AND INTRAOCULAR LENS PLACEMENT ([place]) RIGHT EYE;  Surgeon: Leandrew Koyanagi, MD;  Location: [place];  Service: Ophthalmology;  Laterality: Right;  . CATARACT EXTRACTION W/PHACO Left 06/04/2016   Procedure: CATARACT EXTRACTION PHACO AND INTRAOCULAR LENS PLACEMENT (IOC);  Surgeon: Leandrew Koyanagi, MD;  Location: [place];  Service: Ophthalmology;  Laterality: Left;  . [place]  . COLONOSCOPY WITH PROPOFOL N/A 07/22/2017   Procedure: COLONOSCOPY WITH PROPOFOL;  Surgeon: Robert Bellow, MD;  Location: [facility] ENDOSCOPY;  Service: Endoscopy;  Laterality: N/A;  . FRACTURE SURGERY Left 2011   arm, plate and 12 screws  . LEG SURGERY Right 01/02/2015  . MASTECTOMY Right 2013  . Right inguinal hernia repair  2013  . VEIN SURGERY Left 2015    Family History  Problem Relation Age of Onset  . Dementia Mother   . Stroke Father   . Breast cancer Neg Hx       Current Outpatient Medications:  .  ciprofloxacin (CIPRO) 500 MG tablet, Take 1 tablet (500 mg total) by mouth 2 (two) times daily  for 10 days., Disp: 20 tablet, Rfl: 0 .  meclizine (ANTIVERT) 25 MG tablet, Take 1 tablet (25 mg total) by mouth 3 (three) times daily as needed for dizziness., Disp: 30 tablet, Rfl: 0 .  metroNIDAZOLE (FLAGYL) 500 MG tablet, Take 1 tablet (500 mg total) by mouth 3 (three) times daily for 10 days., Disp: 30 tablet, Rfl: 0 .  calcium carbonate (OS-CAL) 600 MG TABS, Take 600 mg by mouth 2 (two)  times daily with a meal., Disp: , Rfl:   Allergies  Allergen Reactions  . Codeine Nausea Only    I personally reviewed active problem list, medication list, allergies, family history, social history, health maintenance with the patient/caregiver today.   ROS  Ten systems reviewed and is negative except as mentioned in HPI   Objective  Virtual encounter, vitals not obtained.  There is no height or weight on file to calculate BMI.  Physical Exam  Awake, alert and oriented  PHQ2/9: Depression screen [name] 2/9 03/12/2020 11/03/2019 07/07/2017 05/26/2017 05/26/2017  Decreased Interest 0 0 0 1 0  Down, Depressed, Hopeless 0 0 2 3 1   PHQ - 2 Score 0 0 2 4 1   Altered sleeping 0 0 0 0 -  Tired, decreased energy 0 0 3 3 -  Change in appetite 0 0 0 0 -  Feeling bad or failure about yourself  0 0 0 3 -  Trouble concentrating 0 0 0 0 -  Moving slowly or fidgety/restless 0 0 0 0 -  Suicidal thoughts 0 0 0 0 -  PHQ-9 Score 0 0 5 10 -  Difficult doing work/chores - - Not difficult at all - -   PHQ-2/9 Result is negative.    Fall Risk: Fall Risk  03/12/2020 05/26/2017  Falls in the past year? 0 No  Number falls in past yr: 0 -  Injury with Fall? 0 -     Assessment & Plan  1. Diverticulitis  Advised her to continue antibiotics   2. Ovarian cyst, left  - US Pelvis Complete; Future  3. Atherosclerosis of aorta ([place])  She is not taking statin therapy, she is willing to start medication  I discussed the assessment and treatment plan with the patient. The patient was provided an opportunity to ask questions and all were answered. The patient agreed with the plan and demonstrated an understanding of the instructions.   The patient was advised to call back or seek an in-person evaluation if the symptoms worsen or if the condition fails to improve as anticipated.  I provided 25 minutes of non-face-to-face time during this encounter.  [name], MD

## 2020-03-13 ENCOUNTER — Telehealth: Payer: Self-pay

## 2020-03-13 NOTE — Telephone Encounter (Signed)
I tried to call this patient to inform her that she has been scheduled to have her Pelvis Ultrasound on Wednesday, May 26th at 11am at the [facility] but there was no answer. Her mailbox is full so I was unable to leave a message.  They ask for her to drink 32oz of water 30 mins prior to her scheduled imaging and not to use the restroom.   If she cannot make this appt, please have her call centralized scheduling at [phone].  A CRM will be placed.

## 2020-03-13 NOTE — Telephone Encounter (Signed)
Letter has been sent

## 2020-03-21 ENCOUNTER — Ambulatory Visit
Admission: RE | Admit: 2020-03-21 | Discharge: 2020-03-21 | Disposition: A | Discharge status: Home / Self Care | Payer: Medicare Other | Source: Ambulatory Visit | Attending: Family Medicine | Admitting: Family Medicine

## 2020-03-21 ENCOUNTER — Other Ambulatory Visit: Payer: Self-pay

## 2020-03-21 DIAGNOSIS — D251 Intramural leiomyoma of uterus: Secondary | ICD-10-CM | POA: Diagnosis not present

## 2020-03-21 DIAGNOSIS — N83202 Unspecified ovarian cyst, left side: Secondary | ICD-10-CM | POA: Diagnosis not present

## 2020-05-02 ENCOUNTER — Ambulatory Visit: Payer: Medicare Other | Admitting: Family Medicine

## 2020-05-08 ENCOUNTER — Ambulatory Visit (INDEPENDENT_AMBULATORY_CARE_PROVIDER_SITE_OTHER): Payer: Medicare Other

## 2020-05-08 DIAGNOSIS — Z0000 Encounter for general adult medical examination without abnormal findings: Secondary | ICD-10-CM | POA: Diagnosis not present

## 2020-05-08 NOTE — Progress Notes (Signed)
Subjective:   Rebecca Faulkner is a 79 y.o. female who presents for an Initial Medicare Annual Wellness Visit.  Virtual Visit via Telephone Note  I connected with  Rebecca Faulkner on 05/08/20 at  8:00 AM EDT by telephone and verified that I am speaking with the correct person using two identifiers.  Medicare Annual Wellness visit completed telephonically due to Covid-19 pandemic.   Location: Patient: home Provider: office   I discussed the limitations, risks, security and privacy concerns of performing an evaluation and management service by telephone and the availability of in person appointments. The patient expressed understanding and agreed to proceed.  Unable to perform video visit due to video visit attempted and failed and/or patient does not have video capability.   Some vital signs may be absent or patient reported.   Clemetine Marker, LPN    Review of Systems    Cardiac Risk Factors include: advanced age (>70men, >62 women);dyslipidemia     Objective:    Today's Vitals   05/08/20 0810  PainSc: 0-No pain   There is no height or weight on file to calculate BMI.  Advanced Directives 05/08/2020 03/07/2020 10/26/2019 07/29/2019 10/26/2018 07/27/2018 01/26/2018  Does Patient Have a Medical Advance Directive? No No No No No No No  Would patient like information on creating a medical advance directive? Yes (MAU/Ambulatory/Procedural Areas - Information given) No - Patient declined No - Patient declined No - Patient declined No - Patient declined No - Patient declined -    Current Medications (verified) Outpatient Encounter Medications as of 05/08/2020  Medication Sig   calcium carbonate (OS-CAL) 600 MG TABS Take 600 mg by mouth 2 (two) times daily with a meal.   vitamin B-12 (CYANOCOBALAMIN) 1000 MCG tablet Take 1,000 mcg by mouth daily.   pravastatin (PRAVACHOL) 40 MG tablet Take 1 tablet (40 mg total) by mouth daily. (Patient not taking: Reported on 05/08/2020)    [DISCONTINUED] meclizine (ANTIVERT) 25 MG tablet Take 1 tablet (25 mg total) by mouth 3 (three) times daily as needed for dizziness.   No facility-administered encounter medications on file as of 05/08/2020.    Allergies (verified) Codeine   History: Past Medical History:  Diagnosis Date   Breast cancer ([place]) 2013   right breast   Heart murmur 1990   Hypercholesteremia    Lump or mass in breast    Malignant neoplasm of upper-outer quadrant of female breast ([facility]) 2013   R-breast T1, NO, MO, ER/PR pos., Her 2 neg.   Osteoporosis    Ulcer 1974   Past Surgical History:  Procedure Laterality Date   BREAST SURGERY Right 2013   CATARACT EXTRACTION W/PHACO Right 04/23/2016   Procedure: CATARACT EXTRACTION PHACO AND INTRAOCULAR LENS PLACEMENT (IOC) RIGHT EYE;  Surgeon: Leandrew Koyanagi, MD;  Location: [place] [facility];  Service: Ophthalmology;  Laterality: Right;   CATARACT EXTRACTION W/PHACO Left 06/04/2016   Procedure: CATARACT EXTRACTION PHACO AND INTRAOCULAR LENS PLACEMENT (IOC);  Surgeon: Leandrew Koyanagi, MD;  Location: [facility];  Service: Ophthalmology;  Laterality: Left;   CESAREAN SECTION  1969   COLONOSCOPY WITH PROPOFOL N/A 07/22/2017   Procedure: COLONOSCOPY WITH PROPOFOL;  Surgeon: Robert Bellow, MD;  Location: [facility];  Service: Endoscopy;  Laterality: N/A;   FRACTURE SURGERY Left 2011   arm, plate and 12 screws   LEG SURGERY Right 01/02/2015   MASTECTOMY Right 2013   Right inguinal hernia repair  2013   VEIN SURGERY Left 2015   Family History  Problem Relation Age of Onset   Dementia Mother    Stroke Father    Breast cancer Neg Hx    Social History   Socioeconomic History   Marital status: Divorced    Spouse name: Not on file   Number of children: 1   Years of education: Not on file   Highest education level: Not on file  Occupational History   Not on file  Tobacco Use   Smoking status: Never Smoker    Smokeless tobacco: Never Used  Vaping Use   Vaping Use: Never used  Substance and Sexual Activity   Alcohol use: No    Comment: Quit drinking  11/04/2016   Drug use: No   Sexual activity: Not Currently  Other Topics Concern   Not on file  Social History Narrative   Works full time at [employer]. She is the Freight forwarder and to go window    She has one son  - that lives in [place]   Two dogs and one cat.    Social Determinants of Health   Financial Resource Strain: Low Risk    Difficulty of Paying Living Expenses: Not hard at all  Food Insecurity: No Food Insecurity   Worried About Charity fundraiser in the Last Year: Never true   [place] in the Last Year: Never true  Transportation Needs: No Transportation Needs   Lack of Transportation (Medical): No   Lack of Transportation (Non-Medical): No  Physical Activity: Inactive   Days of Exercise per Week: 0 days   Minutes of Exercise per Session: 0 min  Stress: No Stress Concern Present   Feeling of Stress : Not at all  Social Connections: Unknown   Frequency of Communication with Friends and Family: Patient refused   Frequency of Social Gatherings with Friends and Family: Patient refused   Attends Religious Services: Patient refused   Printmaker: Patient refused   Attends Music therapist: Patient refused   Marital Status: Divorced    Tobacco Counseling Counseling given: Not Answered   Clinical Intake:  Pre-visit preparation completed: Yes  Pain : No/denies pain Pain Score: 0-No pain     Nutritional Risks: None Diabetes: No  How often do you need to have someone help you when you read instructions, pamphlets, or other written materials from your doctor or pharmacy?: 1 - Never    Interpreter Needed?: No  Information entered by :: Clemetine Marker LPN   Activities of Daily Living In your present state of health, do you have any difficulty performing the  following activities: 05/08/2020 03/12/2020  Hearing? Y Y  Comment declines hearing aids -  Vision? N Y  Difficulty concentrating or making decisions? N Y  Walking or climbing stairs? N Y  Dressing or bathing? N Y  Doing errands, shopping? N Y  Conservation officer, nature and eating ? N -  Using the Toilet? N -  In the past six months, have you accidently leaked urine? Y -  Comment wears pads for protection -  Do you have problems with loss of bowel control? N -  Managing your Medications? N -  Managing your Finances? N -  Housekeeping or managing your Housekeeping? N -  Some recent data might be hidden    Patient Care Team: Steele Sizer, MD as PCP - General (Family Medicine) Steele Sizer, MD as Attending Physician (Family Medicine) Lequita Asal, MD as Referring Physician (Hematology and Oncology) Leandrew Koyanagi,  MD as Referring Physician (Ophthalmology) Bary Castilla Forest Gleason, MD as Consulting Physician (General Surgery)  Indicate any recent Medical Services you may have received from other than [facility] providers in the past year (date may be approximate).     Assessment:   This is a routine wellness examination for Rebecca Faulkner.  Hearing/Vision screen  Hearing Screening   125Hz  250Hz  500Hz  1000Hz  2000Hz  3000Hz  4000Hz  6000Hz  8000Hz   Right ear:           Left ear:           Comments: Pt c/o mild hearing difficulty; declines hearing aids   Vision Screening Comments: [facility]; due for exam   Dietary issues and exercise activities discussed: Current Exercise Habits: The patient has a physically strenuous job, but has no regular exercise apart from work., Exercise limited by: None identified  Goals   None    Depression Screen PHQ 2/9 Scores 05/08/2020 03/12/2020 11/03/2019 07/07/2017 05/26/2017 05/26/2017  PHQ - 2 Score 0 0 0 2 4 1   PHQ- 9 Score - 0 0 5 10 -    Fall Risk Fall Risk  05/08/2020 03/12/2020 05/26/2017  Falls in the past year? 0  0 No  Number falls in past yr: 0 0 -  Injury with Fall? 0 0 -  Risk for fall due to : No Fall Risks - -  Follow up Falls prevention discussed - -    Any stairs in or around the home? Yes  If so, are there any without handrails? Yes  Home free of loose throw rugs in walkways, pet beds, electrical cords, etc? Yes  Adequate lighting in your home to reduce risk of falls? Yes   ASSISTIVE DEVICES UTILIZED TO PREVENT FALLS:  Life alert? No  Use of a cane, walker or w/c? No  Grab bars in the bathroom? Yes  Shower chair or bench in shower? No  Elevated toilet seat or a handicapped toilet? Yes   TIMED UP AND GO:  Was the test performed? No . Telephonic visit.  Cognitive Function: Pt declined 6CIT for 2021 AWV; states no memory issues.         Immunizations Immunization History  Administered Date(s) Administered   Influenza, High Dose Seasonal PF 07/07/2017   Influenza-Unspecified 07/04/2014   Pneumococcal Conjugate-13 05/26/2017   Pneumococcal Polysaccharide-23 03/22/2008   Tdap 07/04/2014   Zoster 07/04/2014    TDAP status: Up to date   Flu Vaccine status: Declined, Education has been provided regarding the importance of this vaccine but patient still declined. Advised may receive this vaccine at local pharmacy or Health Dept. Aware to provide a copy of the vaccination record if obtained from local pharmacy or Health Dept. Verbalized acceptance and understanding.   Pneumococcal vaccine status: Up to date   Covid-19 vaccine status: Completed vaccines. Pt advised to bring vaccination record to next appt.   Qualifies for Shingles Vaccine? Yes   Zostavax completed Yes   Shingrix Completed?: No.    Education has been provided regarding the importance of this vaccine. Patient has been advised to call insurance company to determine out of pocket expense if they have not yet received this vaccine. Advised may also receive vaccine at local pharmacy or Health Dept. Verbalized  acceptance and understanding.  Screening Tests Health Maintenance  Topic Date Due   Hepatitis C Screening  Never done   COVID-19 Vaccine (1) Never done   INFLUENZA VACCINE  05/27/2020   TETANUS/TDAP  07/04/2024  DEXA SCAN  Completed   PNA vac Low Risk Adult  Completed    Health Maintenance  Health Maintenance Due  Topic Date Due   Hepatitis C Screening  Never done   COVID-19 Vaccine (1) Never done    Colorectal cancer screening: No longer required.    Mammogram status: Completed 01/17/20. Repeat every year   Bone Density status: Completed 05/18/18. Results reflect: Bone density results: OSTEOPOROSIS. Repeat every 2 years. Pt to discuss repeat screening recommendations at next visit.   Lung Cancer Screening: (Low Dose CT Chest recommended if Age 73-80 years, 30 pack-year currently smoking OR have quit w/in 15years.) does not qualify.   Additional Screening:  Hepatitis C Screening: does qualify; postponed  Vision Screening: Recommended annual ophthalmology exams for early detection of glaucoma and other disorders of the eye. Is the patient up to date with their annual eye exam?  No  - postponed due to Covid Who is the provider or what is the name of the office in which the patient attends annual eye exams? [place] Screening: Recommended annual dental exams for proper oral hygiene  Community Resource Referral / Chronic Care Management: CRR required this visit?  No   CCM required this visit?  No      Plan:     I have personally reviewed and noted the following in the patients chart:    Medical and social history  Use of alcohol, tobacco or illicit drugs   Current medications and supplements  Functional ability and status  Nutritional status  Physical activity  Advanced directives  List of other physicians  Hospitalizations, surgeries, and ER visits in previous 12 months  Vitals  Screenings to include cognitive,  depression, and falls  Referrals and appointments  In addition, I have reviewed and discussed with patient certain preventive protocols, quality metrics, and best practice recommendations. A written personalized care plan for preventive services as well as general preventive health recommendations were provided to patient.     Clemetine Marker, LPN   07/17/1938   Nurse Notes: pt doing well and appreciative of visit today

## 2020-05-08 NOTE — Patient Instructions (Signed)
Rebecca Faulkner , Thank you for taking time to come for your Medicare Wellness Visit. I appreciate your ongoing commitment to your health goals. Please review the following plan we discussed and let me know if I can assist you in the future.   Screening recommendations/referrals: Colonoscopy: done 01/19/17 Mammogram: done 01/17/20 Bone Density: done 05/18/18 Recommended yearly ophthalmology/optometry visit for glaucoma screening and checkup Recommended yearly dental visit for hygiene and checkup  Vaccinations: Influenza vaccine: postponed Pneumococcal vaccine: done 05/26/17 Tdap vaccine: done 07/04/14 Shingles vaccine: Shingrix discussed. Please contact your pharmacy for coverage information.  Covid-19:please bring your vaccination record to your next appt  Advanced directives: Advance directive discussed with you today. I have provided a copy for you to complete at home and have notarized. Once this is complete please bring a copy in to our office so we can scan it into your chart.  Conditions/risks identified: Keep up the great work!  Next appointment: Follow up in one year for your annual wellness visit    Preventive Care 65 Years and Older, Female Preventive care refers to lifestyle choices and visits with your health care provider that can promote health and wellness. What does preventive care include?  A yearly physical exam. This is also called an annual well check.  Dental exams once or twice a year.  Routine eye exams. Ask your health care provider how often you should have your eyes checked.  Personal lifestyle choices, including:  Daily care of your teeth and gums.  Regular physical activity.  Eating a healthy diet.  Avoiding tobacco and drug use.  Limiting alcohol use.  Practicing safe sex.  Taking low-dose aspirin every day.  Taking vitamin and mineral supplements as recommended by your health care provider. What happens during an annual well check? The services  and screenings done by your health care provider during your annual well check will depend on your age, overall health, lifestyle risk factors, and family history of disease. Counseling  Your health care provider may ask you questions about your:  Alcohol use.  Tobacco use.  Drug use.  Emotional well-being.  Home and relationship well-being.  Sexual activity.  Eating habits.  History of falls.  Memory and ability to understand (cognition).  Work and work Statistician.  Reproductive health. Screening  You may have the following tests or measurements:  Height, weight, and BMI.  Blood pressure.  Lipid and cholesterol levels. These may be checked every 5 years, or more frequently if you are over 31 years old.  Skin check.  Lung cancer screening. You may have this screening every year starting at age 73 if you have a 30-pack-year history of smoking and currently smoke or have quit within the past 15 years.  Fecal occult blood test (FOBT) of the stool. You may have this test every year starting at age 17.  Flexible sigmoidoscopy or colonoscopy. You may have a sigmoidoscopy every 5 years or a colonoscopy every 10 years starting at age 28.  Hepatitis C blood test.  Hepatitis B blood test.  Sexually transmitted disease (STD) testing.  Diabetes screening. This is done by checking your blood sugar (glucose) after you have not eaten for a while (fasting). You may have this done every 1-3 years.  Bone density scan. This is done to screen for osteoporosis. You may have this done starting at age 29.  Mammogram. This may be done every 1-2 years. Talk to your health care provider about how often you should have regular mammograms. Talk  with your health care provider about your test results, treatment options, and if necessary, the need for more tests. Vaccines  Your health care provider may recommend certain vaccines, such as:  Influenza vaccine. This is recommended every  year.  Tetanus, diphtheria, and acellular pertussis (Tdap, Td) vaccine. You may need a Td booster every 10 years.  Zoster vaccine. You may need this after age 23.  Pneumococcal 13-valent conjugate (PCV13) vaccine. One dose is recommended after age 79.  Pneumococcal polysaccharide (PPSV23) vaccine. One dose is recommended after age 61. Talk to your health care provider about which screenings and vaccines you need and how often you need them. This information is not intended to replace advice given to you by your health care provider. Make sure you discuss any questions you have with your health care provider. Document Released: 11/09/2015 Document Revised: 07/02/2016 Document Reviewed: 08/14/2015 Elsevier Interactive Patient Education  2017 [place] Prevention in the Home Falls can cause injuries. They can happen to people of all ages. There are many things you can do to make your home safe and to help prevent falls. What can I do on the outside of my home?  Regularly fix the edges of walkways and driveways and fix any cracks.  Remove anything that might make you trip as you walk through a door, such as a raised step or threshold.  Trim any bushes or trees on the path to your home.  Use bright outdoor lighting.  Clear any walking paths of anything that might make someone trip, such as rocks or tools.  Regularly check to see if handrails are loose or broken. Make sure that both sides of any steps have handrails.  Any raised decks and porches should have guardrails on the edges.  Have any leaves, snow, or ice cleared regularly.  Use sand or salt on walking paths during winter.  Clean up any spills in your garage right away. This includes oil or grease spills. What can I do in the bathroom?  Use night lights.  Install grab bars by the toilet and in the tub and shower. Do not use towel bars as grab bars.  Use non-skid mats or decals in the tub or shower.  If you  need to sit down in the shower, use a plastic, non-slip stool.  Keep the floor dry. Clean up any water that spills on the floor as soon as it happens.  Remove soap buildup in the tub or shower regularly.  Attach bath mats securely with double-sided non-slip rug tape.  Do not have throw rugs and other things on the floor that can make you trip. What can I do in the bedroom?  Use night lights.  Make sure that you have a light by your bed that is easy to reach.  Do not use any sheets or blankets that are too big for your bed. They should not hang down onto the floor.  Have a firm chair that has side arms. You can use this for support while you get dressed.  Do not have throw rugs and other things on the floor that can make you trip. What can I do in the kitchen?  Clean up any spills right away.  Avoid walking on wet floors.  Keep items that you use a lot in easy-to-reach places.  If you need to reach something above you, use a strong step stool that has a grab bar.  Keep electrical cords out of the way.  Do  not use floor polish or wax that makes floors slippery. If you must use wax, use non-skid floor wax.  Do not have throw rugs and other things on the floor that can make you trip. What can I do with my stairs?  Do not leave any items on the stairs.  Make sure that there are handrails on both sides of the stairs and use them. Fix handrails that are broken or loose. Make sure that handrails are as long as the stairways.  Check any carpeting to make sure that it is firmly attached to the stairs. Fix any carpet that is loose or worn.  Avoid having throw rugs at the top or bottom of the stairs. If you do have throw rugs, attach them to the floor with carpet tape.  Make sure that you have a light switch at the top of the stairs and the bottom of the stairs. If you do not have them, ask someone to add them for you. What else can I do to help prevent falls?  Wear shoes  that:  Do not have high heels.  Have rubber bottoms.  Are comfortable and fit you well.  Are closed at the toe. Do not wear sandals.  If you use a stepladder:  Make sure that it is fully opened. Do not climb a closed stepladder.  Make sure that both sides of the stepladder are locked into place.  Ask someone to hold it for you, if possible.  Clearly mark and make sure that you can see:  Any grab bars or handrails.  First and last steps.  Where the edge of each step is.  Use tools that help you move around (mobility aids) if they are needed. These include:  Canes.  Walkers.  Scooters.  Crutches.  Turn on the lights when you go into a dark area. Replace any light bulbs as soon as they burn out.  Set up your furniture so you have a clear path. Avoid moving your furniture around.  If any of your floors are uneven, fix them.  If there are any pets around you, be aware of where they are.  Review your medicines with your doctor. Some medicines can make you feel dizzy. This can increase your chance of falling. Ask your doctor what other things that you can do to help prevent falls. This information is not intended to replace advice given to you by your health care provider. Make sure you discuss any questions you have with your health care provider. Document Released: 08/09/2009 Document Revised: 03/20/2016 Document Reviewed: 11/17/2014 Elsevier Interactive Patient Education  2017 [employer].

## 2020-05-28 ENCOUNTER — Telehealth: Payer: Self-pay

## 2020-05-28 ENCOUNTER — Encounter: Payer: Self-pay | Admitting: Family Medicine

## 2020-05-28 ENCOUNTER — Ambulatory Visit (INDEPENDENT_AMBULATORY_CARE_PROVIDER_SITE_OTHER): Payer: Medicare Other | Admitting: Family Medicine

## 2020-05-28 ENCOUNTER — Other Ambulatory Visit: Payer: Self-pay

## 2020-05-28 VITALS — BP 116/72 | HR 94 | Temp 98.7°F | Resp 16 | Ht 62.0 in | Wt 146.4 lb

## 2020-05-28 DIAGNOSIS — Z9011 Acquired absence of right breast and nipple: Secondary | ICD-10-CM

## 2020-05-28 DIAGNOSIS — Z853 Personal history of malignant neoplasm of breast: Secondary | ICD-10-CM

## 2020-05-28 DIAGNOSIS — Z1159 Encounter for screening for other viral diseases: Secondary | ICD-10-CM | POA: Diagnosis not present

## 2020-05-28 DIAGNOSIS — E538 Deficiency of other specified B group vitamins: Secondary | ICD-10-CM

## 2020-05-28 DIAGNOSIS — R739 Hyperglycemia, unspecified: Secondary | ICD-10-CM | POA: Diagnosis not present

## 2020-05-28 DIAGNOSIS — F3341 Major depressive disorder, recurrent, in partial remission: Secondary | ICD-10-CM

## 2020-05-28 DIAGNOSIS — E559 Vitamin D deficiency, unspecified: Secondary | ICD-10-CM | POA: Diagnosis not present

## 2020-05-28 DIAGNOSIS — M810 Age-related osteoporosis without current pathological fracture: Secondary | ICD-10-CM | POA: Diagnosis not present

## 2020-05-28 DIAGNOSIS — I700 Atherosclerosis of aorta: Secondary | ICD-10-CM

## 2020-05-28 DIAGNOSIS — N83202 Unspecified ovarian cyst, left side: Secondary | ICD-10-CM | POA: Diagnosis not present

## 2020-05-28 MED ORDER — ROSUVASTATIN CALCIUM 5 MG PO TABS: 5.0000 mg | ORAL_TABLET | Freq: Every day | ORAL | 0 refills | Status: DC

## 2020-05-28 NOTE — Progress Notes (Signed)
Name: Rebecca Faulkner   MRN: [id number]    DOB: 06/04/1941   Date:05/28/2020       Progress Note  Subjective  Chief Complaint  Chief Complaint  Patient presents with  . Follow-up    calcium/labs    HPI  History of breast cancer - Right side s/p mastectomy in 2013, used to see Dr. Polly Cobia, now has yearly follow ups with Dr. Mike Gip last  mammogram of left breast March 2021 repeat in 6 months. No breast lumps, discomfort of nipple discharge   Atherosclerosis of Aorta: CT scan 03/07/2020 , we sent Pravastatin but could not tolerate it - caused constipation. She states she went 5 days without a bowel movement, back to normal now since she stopped medication, she is willing to try a different type.   Osteoporosis:  She is due for repeat bone density  Hyperglycemia: denies polyphagia,polydipsia or polyuria , we will check A1C  Vitamin D and B12 deficiency: she is taking supplementation again   History of alcoholism: she is no longer drinking, she remained sober during the pandemic She quit about 5 years ago  MDD in partial remission: feels lonely, does not like one of the co-workers, no crying spells, but feels down, does not want medication. She was very depressed in the past, recurrent symptoms.   Ovarian cyst: US done after CT for diverticulitis and abnormal findings, she will go back for repeat US in 6 months   IMPRESSION: 1. 4.2 cm simple left adnexal cyst, likely an ovarian cyst. This has benign characteristics, but given its size, follow up by ultrasound is recommended in 6 months. This recommendation follows consensus guidelines: Simple Adnexal Cysts: SRU Consensus Conference Update on Follow-up and Reporting. Radiology 2019; [id number]. 2. 1.9 cm intramural fibroid at the anterior uterine fundus. 3. Nonvisualization of the right ovary. No other adnexal mass or free fluid.   Patient Active Problem List   Diagnosis Date Noted  . History of diverticulitis 03/12/2020  . Left  ovarian cyst 03/12/2020  . Atherosclerosis of aorta ([place]) 03/12/2020  . Osteoporosis 08/05/2019  . Closed comminuted intertrochanteric fracture of proximal femur ([place]) 07/27/2018  . Mass of upper outer quadrant of left breast 06/01/2018  . Goals of care, counseling/discussion 01/31/2018  . Osteoporosis without current pathological fracture 07/28/2017  . Depression, major, recurrent, mild ([place]) 07/07/2017  . B12 deficiency 07/07/2017  . Vitamin D deficiency 07/07/2017  . Screening for colon cancer 05/27/2017  . Malignant neoplasm of upper-outer quadrant of right breast in female, estrogen receptor positive ([place]) 05/27/2017  . Glaucoma 05/26/2017  . History of alcoholism ([place]) 05/26/2017  . History of hip fracture 05/10/2015  . Breast cancer, right ([place]) 05/27/2012  . Estrogen receptor positive status (ER+) 05/14/2012    Past Surgical History:  Procedure Laterality Date  . BREAST SURGERY Right 2013  . CATARACT EXTRACTION W/PHACO Right 04/23/2016   Procedure: CATARACT EXTRACTION PHACO AND INTRAOCULAR LENS PLACEMENT ([place]) RIGHT EYE;  Surgeon: Leandrew Koyanagi, MD;  [facility];  Service: Ophthalmology;  Laterality: Right;  . CATARACT EXTRACTION W/PHACO Left 06/04/2016   Procedure: CATARACT EXTRACTION PHACO AND INTRAOCULAR LENS PLACEMENT ([facility]);  Surgeon: Leandrew Koyanagi, MD;  Location: [facility];  Service: Ophthalmology;  Laterality: Left;  . [place]  . COLONOSCOPY WITH PROPOFOL N/A 07/22/2017   Procedure: COLONOSCOPY WITH PROPOFOL;  Surgeon: Robert Bellow, MD;  Location: [facility];  Service: Endoscopy;  Laterality: N/A;  . FRACTURE SURGERY Left 2011   arm, plate  and 12 screws  . LEG SURGERY Right 01/02/2015  . MASTECTOMY Right 2013  . Right inguinal hernia repair  2013  . VEIN SURGERY Left 2015    Family History  Problem Relation Age of Onset  . Dementia Mother   . Stroke Father   . Diabetes Brother   . Breast cancer Neg Hx      Social History   Tobacco Use  . Smoking status: Never Smoker  . Smokeless tobacco: Never Used  Substance Use Topics  . Alcohol use: No    Comment: Quit drinking  11/04/2016     Current Outpatient Medications:  .  calcium carbonate (OS-CAL) 600 MG TABS, Take 600 mg by mouth 2 (two) times daily with a meal., Disp: , Rfl:  .  vitamin B-12 (CYANOCOBALAMIN) 1000 MCG tablet, Take 1,000 mcg by mouth daily., Disp: , Rfl:  .  rosuvastatin (CRESTOR) 5 MG tablet, Take 1 tablet (5 mg total) by mouth daily., Disp: 30 tablet, Rfl: 0  Allergies  Allergen Reactions  . Codeine Nausea Only    I personally reviewed active problem list, medication list, allergies, family history, social history, health maintenance with the patient/caregiver today.   ROS  Constitutional: Negative for fever or weight change.  Respiratory: Negative for cough and shortness of breath.   Cardiovascular: Negative for chest pain or palpitations.  Gastrointestinal: Negative for abdominal pain, no bowel changes.  Musculoskeletal: Positive for gait problem but no joint swelling.  Skin: Negative for rash.  Neurological: Negative for dizziness ( very seldom) bur no  headache.  No other specific complaints in a complete review of systems (except as listed in HPI above).   Objective  Vitals:   05/28/20 1437  BP: 116/72  Pulse: 94  Resp: 16  Temp: 98.7 F (37.1 C)  TempSrc: Temporal  SpO2: 99%  Weight: 146 lb 6.4 oz (66.4 kg)  Height: 5\' 2"  (1.575 m)    Body mass index is 26.78 kg/m.  Physical Exam  Constitutional: Patient appears well-developed and well-nourished.No distress.  HEENT: head atraumatic, normocephalic, pupils equal and reactive to light,  neck supple Cardiovascular: Normal rate, regular rhythm and normal heart sounds.  No murmur heard. No BLE edema. Pulmonary/Chest: Effort normal and breath sounds normal. No respiratory distress. Abdominal: Soft.  There is no tenderness. Muscular  skeletal: history of femur fracture, uses a cane to help with ambulation, small steps but moves quickly , stiff  Psychiatric: Patient has a normal mood and affect. behavior is normal. Judgment and thought content normal.  Recent Results (from the past 2160 hour(s))  Lipase, blood     Status: None   Collection Time: 03/07/20  8:47 AM  Result Value Ref Range   Lipase 39 11 - 51 U/L    Comment: Performed at [facility], [address] [place], [place] [address]  Comprehensive metabolic panel     Status: Abnormal   Collection Time: 03/07/20  8:47 AM  Result Value Ref Range   Sodium 136 135 - 145 mmol/L   Potassium 3.8 3.5 - 5.1 mmol/L   Chloride 108 98 - 111 mmol/L   CO2 19 (L) 22 - 32 mmol/L   Glucose, Bld 113 (H) 70 - 99 mg/dL    Comment: Glucose reference range applies only to samples taken after fasting for at least 8 hours.   BUN 14 8 - 23 mg/dL   Creatinine, Ser 0.59 0.44 - 1.00 mg/dL   Calcium 9.1 8.9 - 10.3 mg/dL  Total Protein 7.5 6.5 - 8.1 g/dL   Albumin 4.3 3.5 - 5.0 g/dL   AST 20 15 - 41 U/L   ALT 15 0 - 44 U/L   Alkaline Phosphatase 67 38 - 126 U/L   Total Bilirubin 0.5 0.3 - 1.2 mg/dL   GFR calc non Af Amer >60 >60 mL/min   GFR calc Af Amer >60 >60 mL/min   Anion gap 9 5 - 15    Comment: Performed at [facility], [address] [place], [place] [address]  CBC     Status: None   Collection Time: 03/07/20  8:47 AM  Result Value Ref Range   WBC 8.1 4.0 - 10.5 K/uL   RBC 4.93 3.87 - 5.11 MIL/uL   Hemoglobin 14.4 12.0 - 15.0 g/dL   HCT 43.6 36 - 46 %   MCV 88.4 80.0 - 100.0 fL   MCH 29.2 26.0 - 34.0 pg   MCHC 33.0 30.0 - 36.0 g/dL   RDW 13.9 11.5 - 15.5 %   Platelets 214 150 - 400 K/uL   nRBC 0.0 0.0 - 0.2 %    Comment: Performed at [facility], [address] [place], [place] [address]     PHQ2/9: Depression screen [facility] 2/9 05/28/2020 05/08/2020 03/12/2020 11/03/2019 07/07/2017  Decreased Interest 1 0 0 0 0  Down, Depressed,  Hopeless 1 0 0 0 2  PHQ - 2 Score 2 0 0 0 2  Altered sleeping 1 - 0 0 0  Tired, decreased energy 1 - 0 0 3  Change in appetite 0 - 0 0 0  Feeling bad or failure about yourself  0 - 0 0 0  Trouble concentrating 0 - 0 0 0  Moving slowly or fidgety/restless 0 - 0 0 0  Suicidal thoughts 0 - 0 0 0  PHQ-9 Score 4 - 0 0 5  Difficult doing work/chores Not difficult at all - - - Not difficult at all    phq 9 is negative   Fall Risk: Fall Risk  05/28/2020 05/08/2020 03/12/2020 05/26/2017  Falls in the past year? 0 0 0 No  Number falls in past yr: 0 0 0 -  Injury with Fall? 0 0 0 -  Risk for fall due to : - No Fall Risks - -  Follow up - Falls prevention discussed - -    Functional Status Survey: Is the patient deaf or have difficulty hearing?: No Does the patient have difficulty seeing, even when wearing glasses/contacts?: No Does the patient have difficulty concentrating, remembering, or making decisions?: No Does the patient have difficulty walking or climbing stairs?: No Does the patient have difficulty dressing or bathing?: No Does the patient have difficulty doing errands alone such as visiting a doctor's office or shopping?: No    Assessment & Plan  1. Left ovarian cyst  - US Pelvic Complete With Transvaginal; Future  2. Atherosclerosis of aorta ([facility])  - rosuvastatin (CRESTOR) 5 MG tablet; Take 1 tablet (5 mg total) by mouth daily.  Dispense: 30 tablet; Refill: 0 - Lipid panel  3. Age-related osteoporosis without current pathological fracture  Discussed importance of calling and scheduling bone density that was ordered by Dr. Mike Gip   4. Vitamin D deficiency  Continue supplementation  5. Vitamin B 12 deficiency  Continue supplementation  6. Need for hepatitis C screening test  - Hepatitis C antibody  7. Hyperglycemia  - Hemoglobin A1c  8. History of right mastectomy   9.  History of breast cancer in female  Still under the care of Dr. Mike Gip, mammogram  up to date   44. Recurrent major depressive disorder, in partial remission ([place])  Doing better, still under stress with co-worker, feels lonely, misses her brothers

## 2020-05-28 NOTE — Telephone Encounter (Signed)
I tried to contact this patient to inform her that she has been scheduled to have her [place] on Thursday, August 5th at 1:30pm at [facility], but there was no answer and her mailbox is full. She does not have a MyChart account so I was not able to send a message.  She is to arrive at least 15 mins early with a full bladder, preferably start drinking 32oz of fluids no later than 1pm and do not use the restroom until after the scan has been completed.  A CRM will be placed.

## 2020-05-29 LAB — LIPID PANEL
Cholesterol: 244 mg/dL — ABNORMAL HIGH (ref <200)
HDL: 59 mg/dL (ref >=50)
LDL Cholesterol (Calc): 158 mg/dL (calc) — ABNORMAL HIGH
Non-HDL Cholesterol (Calc): 185 mg/dL (calc) — ABNORMAL HIGH (ref <130)
Total CHOL/HDL Ratio: 4.1 (calc) (ref <5.0)
Triglycerides: 141 mg/dL (ref <150)

## 2020-05-29 LAB — HEPATITIS C ANTIBODY
Hepatitis C Ab: NONREACTIVE
SIGNAL TO CUT-OFF: 0.01 (ref <1.00)

## 2020-05-29 LAB — HEMOGLOBIN A1C
Hgb A1c MFr Bld: 5.5 % of total Hgb (ref <5.7)
Mean Plasma Glucose: 111 (calc)
eAG (mmol/L): 6.2 (calc)

## 2020-05-29 NOTE — Telephone Encounter (Signed)
patient has been rescheduled for 12/07/2020 at 2pm at [facility] - same instructions as before

## 2020-05-31 ENCOUNTER — Ambulatory Visit: Payer: Medicare Other

## 2020-06-05 ENCOUNTER — Telehealth: Payer: Self-pay

## 2020-06-05 NOTE — Telephone Encounter (Signed)
Copied from [place] [phone]. Topic: General - Inquiry >> Jun 05, 2020  9:22 AM Greggory Keen D wrote: Reason for CRM: Pt called saying she spoke to a nurse yesterday regarding her labs and something the nurse said about her being a candidate for a stroke has bothered her and she would like the nurse to call her back and talk more to her regarding this  CB#  [phone]   Patient called about labs from yesterday. Called patient and answered questions that she had about the ASCVD risk score.

## 2020-07-26 NOTE — Progress Notes (Signed)
[facility]  [address] [place], [place] [address] Phone: [phone]  Fax: [phone]   Clinic Day:  07/30/2020  Referring physician: Steele Sizer, MD  Chief Complaint: Rebecca Faulkner is a 79 y.o. female with stage IA right breast cancer who is seen for 1 year assessment.  HPI: The patient was last seen in the medical oncology clinic on 07/29/2019. At that time, she was doing well.  Exam was unremarkable. Hematocrit was 42.2, hemoglobin 13.9, platelets 264,000, WBC 7,700. CMP was normal. CA27.29 was 13.5.  Diagnostic mammogram on 01/17/2020 revealed decreased conspicuity of a probably benign asymmetry in the slightly upper left breast. There was no sonographic correlate on the prior exam. Follow-up diagnostic mammogram in 6 months was recommended.  Abdomen and pelvis CT on 03/07/2020 for body aches revealed very mild acute sigmoid diverticulitis. There was no evidence of perforation or abscess.  There was a 4.0 cm benign-appearing cyst in the left adnexa. Given patient's age and size of the cyst, further evaluation with pelvic ultrasound was recommended.   Pelvis ultrasound on 03/21/2020 revealed a 4.2 cm simple left adnexal cyst, likely an ovarian cyst. This had benign characteristics, but given its size, follow up by ultrasound was recommended in 6 months.  During the interim, she has been good. She still gets lightheaded every once in a while. She denies shortness of breath, chest pain, and palpitations. She had COVID-19 around Christmas and her senses of taste and smell are slowly improving.  She performs regular breast self exams and has occasional tenderness in her right breast.  She states that she has Mondays and Wednesdays off from work. She wishes to have appointments on these days.   Past Medical History:  Diagnosis Date  . Breast cancer ([place]) 2013   right breast  . Heart murmur 1990  . Hypercholesteremia   . Lump or mass in  breast   . Malignant neoplasm of upper-outer quadrant of female breast ([place]) 2013   R-breast T1, NO, MO, ER/PR pos., Her 2 neg.  . Osteoporosis   . Ulcer 1974    Past Surgical History:  Procedure Laterality Date  . BREAST SURGERY Right 2013  . CATARACT EXTRACTION W/PHACO Right 04/23/2016   Procedure: CATARACT EXTRACTION PHACO AND INTRAOCULAR LENS PLACEMENT ([place]) RIGHT EYE;  Surgeon: Leandrew Koyanagi, MD;  Location: [facility];  Service: Ophthalmology;  Laterality: Right;  . CATARACT EXTRACTION W/PHACO Left 06/04/2016   Procedure: CATARACT EXTRACTION PHACO AND INTRAOCULAR LENS PLACEMENT ([facility]);  Surgeon: Leandrew Koyanagi, MD;  Location: [facility];  Service: Ophthalmology;  Laterality: Left;  . [place]  . COLONOSCOPY WITH PROPOFOL N/A 07/22/2017   Procedure: COLONOSCOPY WITH PROPOFOL;  Surgeon: Robert Bellow, MD;  Location: [facility];  Service: Endoscopy;  Laterality: N/A;  . FRACTURE SURGERY Left 2011   arm, plate and 12 screws  . LEG SURGERY Right 01/02/2015  . MASTECTOMY Right 2013  . Right inguinal hernia repair  2013  . VEIN SURGERY Left 2015    Family History  Problem Relation Age of Onset  . Dementia Mother   . Stroke Father   . Diabetes Brother   . Breast cancer Neg Hx     Social History:  reports that she has never smoked. She has never used smokeless tobacco. She reports that she does not drink alcohol and does not use drugs. 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 Patient needs appointments on Mondays  and Wednesdays secondary to work.  She lives alone in [place]. The patient is alone today.  Allergies:  Allergies  Allergen Reactions  . Codeine Nausea Only  . Pravastatin     Constipation     Current Medications: Current Outpatient Medications  Medication Sig Dispense Refill  . calcium carbonate (OS-CAL) 600 MG TABS Take 600 mg by mouth 2 (two) times daily with a meal.    . rosuvastatin  (CRESTOR) 5 MG tablet Take 1 tablet (5 mg total) by mouth daily. 30 tablet 0  . vitamin B-12 (CYANOCOBALAMIN) 1000 MCG tablet Take 1,000 mcg by mouth daily.     No current facility-administered medications for this visit.    Review of Systems  Constitutional: Positive for weight loss (1 lb). Negative for chills, diaphoresis, fever and malaise/fatigue.       Feels "fine".  HENT: Negative for congestion, ear discharge, ear pain, hearing loss, nosebleeds, sinus pain, sore throat and tinnitus.        Senses of taste and smell are slowly returning to normal after COVID-19 infection in 09/2019.  Eyes: Negative.  Negative for blurred vision.  Respiratory: Negative.  Negative for cough, hemoptysis, sputum production and shortness of breath.   Cardiovascular: Negative.  Negative for chest pain, palpitations, orthopnea and leg swelling.  Gastrointestinal: Negative.  Negative for abdominal pain, blood in stool, constipation, diarrhea, heartburn, melena, nausea and vomiting.  Genitourinary: Negative.  Negative for dysuria, frequency, hematuria and urgency.  Musculoskeletal: Negative for back pain, joint pain, myalgias and neck pain.       Occasional right breast tenderness.  Skin: Negative.  Negative for itching and rash.  Neurological: Positive for dizziness (now and then light headedness). Negative for tingling, sensory change, weakness and headaches.  Endo/Heme/Allergies: Negative.  Does not bruise/bleed easily.  Psychiatric/Behavioral: Negative.  Negative for depression, memory loss and suicidal ideas. The patient is not nervous/anxious and does not have insomnia.   All other systems reviewed and are negative.  Performance status (ECOG): 0  Vitals Blood pressure 121/67, pulse 91, temperature 98.3 F (36.8 C), temperature source Tympanic, SpO2 100 %.   Physical Exam Vitals and nursing note reviewed.  Constitutional:      General: She is not in acute distress.    Appearance: She is  well-developed.  HENT:     Head: Normocephalic and atraumatic.     Comments: Curly short gray hair.    Mouth/Throat:     Mouth: Mucous membranes are moist.     Pharynx: Oropharynx is clear. No oropharyngeal exudate or posterior oropharyngeal erythema.  Eyes:     General: No scleral icterus.    Conjunctiva/sclera: Conjunctivae normal.     Pupils: Pupils are equal, round, and reactive to light.     Comments: Glasses.  Brown eyes.  Neck:     Vascular: No JVD.  Cardiovascular:     Rate and Rhythm: Normal rate and regular rhythm.     Heart sounds: Normal heart sounds. No murmur heard.  No friction rub. No gallop.   Pulmonary:     Effort: Pulmonary effort is normal. No respiratory distress.     Breath sounds: Normal breath sounds. No wheezing or rales.  Chest:     Breasts:        Right: Absent.        Left: Skin change (fibrocystic changes) present. No swelling, inverted nipple or mass.     Comments: Right chest wall without erythema or nodularity. Abdominal:     General:  Bowel sounds are normal. There is no distension.     Palpations: Abdomen is soft. There is no mass.     Tenderness: There is no abdominal tenderness. There is no guarding or rebound.  Musculoskeletal:        General: No swelling or tenderness. Normal range of motion.     Cervical back: Normal range of motion and neck supple. No tenderness.  Lymphadenopathy:     Cervical: No cervical adenopathy.     Lower Body: No right inguinal adenopathy. No left inguinal adenopathy.  Skin:    General: Skin is warm and dry.     Coloration: Skin is not pale.     Findings: No erythema or rash.  Neurological:     Mental Status: She is alert and oriented to person, place, and time.  Psychiatric:        Behavior: Behavior normal.        Thought Content: Thought content normal.        Judgment: Judgment normal.    Appointment on 07/30/2020  Component Date Value Ref Range Status  . WBC 07/30/2020 6.0  4.0 - 10.5 K/uL Final  .  RBC 07/30/2020 4.82  3.87 - 5.11 MIL/uL Final  . Hemoglobin 07/30/2020 14.4  12.0 - 15.0 g/dL Final  . HCT 07/30/2020 43.5  36 - 46 % Final  . MCV 07/30/2020 90.2  80.0 - 100.0 fL Final  . MCH 07/30/2020 29.9  26.0 - 34.0 pg Final  . MCHC 07/30/2020 33.1  30.0 - 36.0 g/dL Final  . RDW 07/30/2020 14.1  11.5 - 15.5 % Final  . Platelets 07/30/2020 238  150 - 400 K/uL Final  . nRBC 07/30/2020 0.0  0.0 - 0.2 % Final  . Neutrophils Relative % 07/30/2020 63  % Final  . Neutro Abs 07/30/2020 3.8  1.7 - 7.7 K/uL Final  . Lymphocytes Relative 07/30/2020 23  % Final  . Lymphs Abs 07/30/2020 1.4  0.7 - 4.0 K/uL Final  . Monocytes Relative 07/30/2020 11  % Final  . Monocytes Absolute 07/30/2020 0.7  0 - 1 K/uL Final  . Eosinophils Relative 07/30/2020 2  % Final  . Eosinophils Absolute 07/30/2020 0.1  0 - 0 K/uL Final  . Basophils Relative 07/30/2020 1  % Final  . Basophils Absolute 07/30/2020 0.0  0 - 0 K/uL Final  . Immature Granulocytes 07/30/2020 0  % Final  . Abs Immature Granulocytes 07/30/2020 0.02  0.00 - 0.07 K/uL Final   Performed at [facility], [address] [place], [place] [address]    Assessment:  MARYELLA ABOOD is a 79 y.o. female with stage IA right breast cancer s/p right modified radical mastectomy with sentinel lymph node biopsy on 05/27/2012.  Pathology revealed a 1.8 cm grade I invasive carcinoma.  There was angiolymphatic invasion.  There was low grade DCIS.  One sentinel node and 5 non-sentinel nodes were removed.  Zero of 6 nodes were positive.  Tumor was ER + (90%). PR + (90%). HER-2 receptor - by FISH.  Pathologic stage was T1cN0M0 breast cancer.  Oncotype DX score was 10 which translated into a 7% chance of recurrent disease in 10 years of tamoxifen.  She began Femara in 07/2012.  She stopped Femara in 01/2018.  Left mammogram on 05/18/2018 revealed no evidence of malignancy.  Ultrasound on 06/01/2018 revealed no discernible cystic or solid lesion,  area of architectural distortion or unusual vascularity. Screening left mammogram on 07/05/2019 revealed possible asymmetry  in the left breast.  Diagnostic left mammogram and ultrasound on 07/18/2019 revealed a probably benign asymmetry within the left breast demonstrated best on the ML and MLO views.  Ultrasound revealed no discrete solid or cystic lesion.  Diagnostic mammogram on 01/17/2020 revealed decreased conspicuity of a probably benign asymmetry in the slightly upper left breast. There was no sonographic correlate on the prior exam. Follow-up diagnostic mammogram in 6 months was recommended.  CA27.29 has been followed:  14.3 on 02/02/2013, 13.9 on 08/11/2013, 10.1 on 02/20/2014, 14.3 on 08/22/2014, 5.6 on 07/01/2016, 14.0 on 01/19/2017, 8.0 on 07/28/2017, 11 on 01/26/2018, and 12.4 on 07/27/2018.  She was admitted with a right femur fracture s/p fall in 12/2014.  She was on [place], calcium and vitamin D.  She was on Fosamax.  Bone density on 08/08/2014 revealed osteoporosis with a T score of -2.9 in the left femur, -1.6 in the left forearm radius.  Bone density on 05/12/2016 revealed osteoporosis with a T score of -2.7 in the left femur, -1.4 in the left forearm radius.  Bone density on 05/18/2018 revealed osteoporosis with a T-score of -2.8 in the left femur and - 1.4 in the left forearm radius.  Abdomen and pelvis CT on 03/07/2020 for body aches revealed very mild acute sigmoid diverticulitis. There was no evidence of perforation or abscess.  There was a 4.0 cm benign-appearing cyst in the left adnexa.  Pelvis ultrasound on 03/21/2020 revealed a 4.2 cm simple left adnexal cyst, likely an ovarian cyst. This had benign characteristics, but given its size, follow up by ultrasound was recommended in 6 months.  Colonoscopy on 07/22/2017 revealed diverticulosis in the sigmoid colon and one 5 mm polyp in the rectum.  Pathology revealed a tubular adenoma.  She received the [place] COVID-19 vaccine  on 11/23/2019 and 12/14/2019.  Symptomatically, she feels "fine".  She denies any breast concerns.  Exam is unremarkable.  Plan: 1.   Labs today:  CBC with diff, CMP, CA27.29, CA125. 2.   Stage IA right breast cancer Clinically, she continues to do well. Exam reveals no evidence of recurrent disease. Review of interval diagnostic mammogram on 01/17/2020.  There is asymmetry in the upper left breast.    Discuss follow-up diagnostic mammogram now. She has been off Femara since 01/2018. Continue to monitor. 3.   Left adnexal cyst  Review interval abdominal and pelvic CT scan on 03/07/2020 and pelvic ultrasound on 03/21/2020.  Cyst was discovered on work-up for flulike symptoms" hurting all over".  She denies any current pelvic pain or discomfort.  Review plan for repeat ultrasound in 6 months (around 09/21/2020).  Check CA125.  Reschedule ultrasound from February to November with patient's restrictions of Mondays and Wednesdays only. 4.   Osteoporosis Reschedule bone density. Continue calcium and vitamin D. Review prior consideration of Prolia.  Dental clearance is needed. 5.   Left diagnostic mammogram next available (f/u asymmetry). 6.   Please reschedule pelvis ultrasound to 1st appt on or after 09/21/2020. 7.   Please reschedule bone density appt (was scheduled for 04/2020). 8.   RTC after 3 imaging studies above for MD assessment and review of results. 9.   RTC in 1 year for MD assessment and labs (CBC with diff, CMP, CA27.29, CA125).  I discussed the assessment and treatment plan with the patient.  The patient was provided an opportunity to ask questions and all were answered.  The patient agreed with the plan and demonstrated an understanding of the instructions.  The patient  was advised to call back if the symptoms worsen or if the condition fails to improve as anticipated.   Lequita Asal, MD, PhD    07/30/2020, 9:19 AM  I, Mirian Mo Tufford, am acting as a Education administrator for  Lequita Asal, MD.  I, [place] Mike Gip, MD, have reviewed the above documentation for accuracy and completeness, and I agree with the above.

## 2020-07-30 ENCOUNTER — Inpatient Hospital Stay: Payer: Medicare Other | Attending: Hematology and Oncology | Admitting: Hematology and Oncology

## 2020-07-30 ENCOUNTER — Encounter: Payer: Self-pay | Admitting: Hematology and Oncology

## 2020-07-30 ENCOUNTER — Other Ambulatory Visit: Payer: Self-pay

## 2020-07-30 ENCOUNTER — Other Ambulatory Visit: Payer: Self-pay | Admitting: Hematology and Oncology

## 2020-07-30 ENCOUNTER — Inpatient Hospital Stay: Payer: Medicare Other

## 2020-07-30 VITALS — BP 121/67 | HR 91 | Temp 98.3°F | Wt 145.8 lb

## 2020-07-30 DIAGNOSIS — Z170 Estrogen receptor positive status [ER+]: Secondary | ICD-10-CM

## 2020-07-30 DIAGNOSIS — M810 Age-related osteoporosis without current pathological fracture: Secondary | ICD-10-CM

## 2020-07-30 DIAGNOSIS — N83202 Unspecified ovarian cyst, left side: Secondary | ICD-10-CM

## 2020-07-30 DIAGNOSIS — C50411 Malignant neoplasm of upper-outer quadrant of right female breast: Secondary | ICD-10-CM

## 2020-07-30 DIAGNOSIS — Z79811 Long term (current) use of aromatase inhibitors: Secondary | ICD-10-CM | POA: Diagnosis not present

## 2020-07-30 DIAGNOSIS — K5732 Diverticulitis of large intestine without perforation or abscess without bleeding: Secondary | ICD-10-CM | POA: Insufficient documentation

## 2020-07-30 DIAGNOSIS — C50911 Malignant neoplasm of unspecified site of right female breast: Secondary | ICD-10-CM | POA: Diagnosis not present

## 2020-07-30 DIAGNOSIS — D125 Benign neoplasm of sigmoid colon: Secondary | ICD-10-CM | POA: Insufficient documentation

## 2020-07-30 DIAGNOSIS — K621 Rectal polyp: Secondary | ICD-10-CM | POA: Diagnosis not present

## 2020-07-30 LAB — COMPREHENSIVE METABOLIC PANEL
ALT: 18 U/L (ref 0–44)
AST: 22 U/L (ref 15–41)
Albumin: 4.3 g/dL (ref 3.5–5.0)
Alkaline Phosphatase: 66 U/L (ref 38–126)
Anion gap: 9 (ref 5–15)
BUN: 12 mg/dL (ref 8–23)
CO2: 25 mmol/L (ref 22–32)
Calcium: 9.3 mg/dL (ref 8.9–10.3)
Chloride: 106 mmol/L (ref 98–111)
Creatinine, Ser: 0.7 mg/dL (ref 0.44–1.00)
GFR calc Af Amer: >60 mL/min (ref >60)
GFR calc non Af Amer: >60 mL/min (ref >60)
Glucose, Bld: 97 mg/dL (ref 70–99)
Potassium: 4.5 mmol/L (ref 3.5–5.1)
Sodium: 140 mmol/L (ref 135–145)
Total Bilirubin: 0.8 mg/dL (ref 0.3–1.2)
Total Protein: 7.6 g/dL (ref 6.5–8.1)

## 2020-07-30 LAB — CBC WITH DIFFERENTIAL/PLATELET
Abs Immature Granulocytes: 0.02 10*3/uL (ref 0.00–0.07)
Basophils Absolute: 0 10*3/uL (ref 0.0–0.1)
Basophils Relative: 1 %
Eosinophils Absolute: 0.1 10*3/uL (ref 0.0–0.5)
Eosinophils Relative: 2 %
HCT: 43.5 % (ref 36.0–46.0)
Hemoglobin: 14.4 g/dL (ref 12.0–15.0)
Immature Granulocytes: 0 %
Lymphocytes Relative: 23 %
Lymphs Abs: 1.4 10*3/uL (ref 0.7–4.0)
MCH: 29.9 pg (ref 26.0–34.0)
MCHC: 33.1 g/dL (ref 30.0–36.0)
MCV: 90.2 fL (ref 80.0–100.0)
Monocytes Absolute: 0.7 10*3/uL (ref 0.1–1.0)
Monocytes Relative: 11 %
Neutro Abs: 3.8 10*3/uL (ref 1.7–7.7)
Neutrophils Relative %: 63 %
Platelets: 238 10*3/uL (ref 150–400)
RBC: 4.82 MIL/uL (ref 3.87–5.11)
RDW: 14.1 % (ref 11.5–15.5)
WBC: 6 10*3/uL (ref 4.0–10.5)
nRBC: 0 % (ref 0.0–0.2)

## 2020-07-30 NOTE — Progress Notes (Signed)
No new changes noted today 

## 2020-07-31 LAB — CA 125: Cancer Antigen (CA) 125: 16.9 U/mL (ref 0.0–38.1)

## 2020-07-31 LAB — CANCER ANTIGEN 27.29: CA 27.29: 11.9 U/mL (ref 0.0–38.6)

## 2020-09-03 ENCOUNTER — Ambulatory Visit
Admission: RE | Admit: 2020-09-03 | Discharge: 2020-09-03 | Disposition: A | Discharge status: Home / Self Care | Payer: Medicare Other | Source: Ambulatory Visit | Attending: Hematology and Oncology | Admitting: Hematology and Oncology

## 2020-09-03 ENCOUNTER — Other Ambulatory Visit: Payer: Self-pay

## 2020-09-03 DIAGNOSIS — C50411 Malignant neoplasm of upper-outer quadrant of right female breast: Secondary | ICD-10-CM

## 2020-09-03 DIAGNOSIS — Z170 Estrogen receptor positive status [ER+]: Secondary | ICD-10-CM

## 2020-09-03 DIAGNOSIS — R29890 Loss of height: Secondary | ICD-10-CM | POA: Diagnosis not present

## 2020-09-03 DIAGNOSIS — Z780 Asymptomatic menopausal state: Secondary | ICD-10-CM | POA: Diagnosis not present

## 2020-09-03 DIAGNOSIS — M810 Age-related osteoporosis without current pathological fracture: Secondary | ICD-10-CM

## 2020-09-03 DIAGNOSIS — M85852 Other specified disorders of bone density and structure, left thigh: Secondary | ICD-10-CM | POA: Diagnosis not present

## 2020-09-03 DIAGNOSIS — R928 Other abnormal and inconclusive findings on diagnostic imaging of breast: Secondary | ICD-10-CM | POA: Diagnosis not present

## 2020-09-03 DIAGNOSIS — Z87828 Personal history of other (healed) physical injury and trauma: Secondary | ICD-10-CM | POA: Diagnosis not present

## 2020-09-04 ENCOUNTER — Telehealth: Payer: Self-pay

## 2020-09-04 NOTE — Telephone Encounter (Signed)
-----   Message from Lequita Asal, MD sent at 09/03/2020  5:02 PM EST ----- Regarding: Please call patient with bone density report  Osteoporosis -2.6 in left femoral hip.  Consider Prolia.  She would need dental clearance.  M ----- Message ----- From: Interface, Rad Results In Sent: 09/03/2020  11:23 AM EST To: Lequita Asal, MD

## 2020-09-04 NOTE — Telephone Encounter (Signed)
Unable to leave a message for the patient. I will reach out to the patient on a later date.

## 2020-09-21 ENCOUNTER — Ambulatory Visit
Admission: RE | Admit: 2020-09-21 | Discharge: 2020-09-21 | Disposition: A | Discharge status: Home / Self Care | Payer: Medicare Other | Source: Ambulatory Visit | Attending: Family Medicine | Admitting: Family Medicine

## 2020-09-21 ENCOUNTER — Other Ambulatory Visit: Payer: Self-pay | Admitting: Family Medicine

## 2020-09-21 ENCOUNTER — Other Ambulatory Visit: Payer: Self-pay

## 2020-09-21 DIAGNOSIS — N83202 Unspecified ovarian cyst, left side: Secondary | ICD-10-CM

## 2020-09-21 DIAGNOSIS — N838 Other noninflammatory disorders of ovary, fallopian tube and broad ligament: Secondary | ICD-10-CM | POA: Diagnosis not present

## 2020-09-23 NOTE — Progress Notes (Signed)
[facility]  [address] [place], [place] [address] Phone: [phone]  Fax: [phone]   Clinic Day:  09/24/2020  Referring physician: Steele Sizer, MD  Chief Complaint: Rebecca Faulkner is a 79 y.o. female with stage IA right breast cancer who is seen for review of imaging and discussion regarding direction of therapy.  HPI: The patient was last seen in the medical oncology clinic on 07/30/2020. At that time, she felt "fine".  She denied any breast concerns.  Exam was unremarkable. Hematocrit was 43.5, hemoglobin 14.4, platelets 238,000, WBC 6,000. CMP was normal. CA125 was 16.9. CA27.29 was 11.9.  Left diagnostic mammogram on 09/03/2020 revealed a stable likely benign asymmetry in the UPPER central LEFT breast dating back to 06/2019. Diagnostic LEFT mammogram in 1 year was recommended.  Bone density on 09/03/2020 revealed osteoporosis with a T-score of -2.6 at the femur neck.  Transabdominal pelvic ultrasound on 09/21/2020 revealed a stable left adnexal cyst, likely ovarian in origin. Right ovary was not visualized.  During the interim, she has been good. She has had on and off lightheadedness and loss of taste/smell since her COVID-19 infection. Her right breast tenderness and itching still comes and goes. She denies any new symptoms or complaints.  She takes calcium and vitamin D.   Past Medical History:  Diagnosis Date  . Breast cancer ([place]) 2013   right breast  . Heart murmur 1990  . Hypercholesteremia   . Lump or mass in breast   . Malignant neoplasm of upper-outer quadrant of female breast ([place]) 2013   R-breast T1, NO, MO, ER/PR pos., Her 2 neg.  . Osteoporosis   . Ulcer 1974    Past Surgical History:  Procedure Laterality Date  . BREAST SURGERY Right 2013  . CATARACT EXTRACTION W/PHACO Right 04/23/2016   Procedure: CATARACT EXTRACTION PHACO AND INTRAOCULAR LENS PLACEMENT ([place]) RIGHT EYE;  Surgeon: Leandrew Koyanagi, MD;   Location: [facility];  Service: Ophthalmology;  Laterality: Right;  . CATARACT EXTRACTION W/PHACO Left 06/04/2016   Procedure: CATARACT EXTRACTION PHACO AND INTRAOCULAR LENS PLACEMENT ([facility]);  Surgeon: Leandrew Koyanagi, MD;  Location: [facility];  Service: Ophthalmology;  Laterality: Left;  . [place]  . COLONOSCOPY WITH PROPOFOL N/A 07/22/2017   Procedure: COLONOSCOPY WITH PROPOFOL;  Surgeon: Robert Bellow, MD;  Location: [facility] END[facility];  Service: Endoscopy;  Laterality: N/A;  . FRACTURE SURGERY Left 2011   arm, plate and 12 screws  . LEG SURGERY Right 01/02/2015  . MASTECTOMY Right 2013  . Right inguinal hernia repair  2013  . VEIN SURGERY Left 2015    Family History  Problem Relation Age of Onset  . Dementia Mother   . Stroke Father   . Diabetes Brother   . Breast cancer Neg Hx     Social History:  reports that she has never smoked. She has never used smokeless tobacco. She reports that she does not drink alcohol and does not use drugs. 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 Patient needs appointments on Mondays and Wednesdays secondary to work.  She lives alone in [place]. The patient is alone today.  Allergies:  Allergies  Allergen Reactions  . Codeine Nausea Only  . Pravastatin     Constipation     Current Medications: Current Outpatient Medications  Medication Sig Dispense Refill  . calcium carbonate (OS-CAL) 600 MG TABS Take 600 mg by mouth 2 (two) times daily with a meal.    .  rosuvastatin (CRESTOR) 5 MG tablet Take 1 tablet (5 mg total) by mouth daily. 30 tablet 0  . vitamin B-12 (CYANOCOBALAMIN) 1000 MCG tablet Take 1,000 mcg by mouth daily.     No current facility-administered medications for this visit.    Review of Systems  Constitutional: Negative for chills, diaphoresis, fever, malaise/fatigue and weight loss (up 4 lbs).  HENT: Negative for congestion, ear discharge, ear pain, hearing loss,  nosebleeds, sinus pain, sore throat and tinnitus.        Senses of taste and smell are on and off.  Eyes: Negative.  Negative for blurred vision.  Respiratory: Negative.  Negative for cough, hemoptysis, sputum production and shortness of breath.   Cardiovascular: Negative.  Negative for chest pain, palpitations, orthopnea and leg swelling.  Gastrointestinal: Negative.  Negative for abdominal pain, blood in stool, constipation, diarrhea, heartburn, melena, nausea and vomiting.  Genitourinary: Negative.  Negative for dysuria, frequency, hematuria and urgency.  Musculoskeletal: Negative for back pain, joint pain, myalgias and neck pain.       Occasional right breast tenderness and itching.  Skin: Negative for rash.  Neurological: Positive for dizziness (comes and goes). Negative for tingling, sensory change, weakness and headaches.  Endo/Heme/Allergies: Negative.  Does not bruise/bleed easily.  Psychiatric/Behavioral: Negative.  Negative for depression, memory loss and suicidal ideas. The patient is not nervous/anxious and does not have insomnia.   All other systems reviewed and are negative.  Performance status (ECOG): 0  Vitals Blood pressure 117/66, pulse 97, temperature 98.3 F (36.8 C), temperature source Oral, resp. rate 16, weight 149 lb 0.5 oz (67.6 kg).   Physical Exam Vitals and nursing note reviewed.  Constitutional:      General: She is not in acute distress.    Appearance: She is well-developed.  HENT:     Head: Normocephalic and atraumatic.     Comments: Curly short gray hair. Eyes:     General: No scleral icterus.    Conjunctiva/sclera: Conjunctivae normal.     Comments: Glasses.  Brown eyes.  Neurological:     Mental Status: She is alert and oriented to person, place, and time.  Psychiatric:        Behavior: Behavior normal.        Thought Content: Thought content normal.        Judgment: Judgment normal.    No visits with results within 3 Day(s) from this visit.   Latest known visit with results is:  Appointment on 07/30/2020  Component Date Value Ref Range Status  . Cancer Antigen (CA) 125 07/30/2020 16.9  0.0 - 38.1 U/mL Final   Comment: (NOTE) Roche Diagnostics Electrochemiluminescence Immunoassay (ECLIA) Values obtained with different assay methods or kits cannot be used interchangeably.  Results cannot be interpreted as absolute evidence of the presence or absence of malignant disease. Performed At: [facility] [place], [place] [id number] Rush Farmer MD [id number]   . CA 27.29 07/30/2020 11.9  0.0 - 38.6 U/mL Final   Comment: (NOTE) Siemens Centaur Immunochemiluminometric Methodology [facility]) Values obtained with different assay methods or kits cannot be used interchangeably. Results cannot be interpreted as absolute evidence of the presence or absence of malignant disease. Performed At: [facility] [place], [place] [id number] Rush Farmer MD [id number]   . Sodium 07/30/2020 140  135 - 145 mmol/L Final  . Potassium 07/30/2020 4.5  3.5 - 5.1 mmol/L Final  . Chloride 07/30/2020 106  98 - 111 mmol/L Final  . CO2  07/30/2020 25  22 - 32 mmol/L Final  . Glucose, Bld 07/30/2020 97  70 - 99 mg/dL Final   Glucose reference range applies only to samples taken after fasting for at least 8 hours.  . BUN 07/30/2020 12  8 - 23 mg/dL Final  . Creatinine, Ser 07/30/2020 0.70  0.44 - 1.00 mg/dL Final  . Calcium 07/30/2020 9.3  8.9 - 10.3 mg/dL Final  . Total Protein 07/30/2020 7.6  6.5 - 8.1 g/dL Final  . Albumin 07/30/2020 4.3  3.5 - 5.0 g/dL Final  . AST 07/30/2020 22  15 - 41 U/L Final  . ALT 07/30/2020 18  0 - 44 U/L Final  . Alkaline Phosphatase 07/30/2020 66  38 - 126 U/L Final  . Total Bilirubin 07/30/2020 0.8  0.3 - 1.2 mg/dL Final  . GFR calc non Af Amer 07/30/2020 >60  >60 mL/min Final  . GFR calc Af Amer 07/30/2020 >60  >60 mL/min Final  . Anion gap 07/30/2020 9  5 - 15 Final    Performed at [facility], [address] [place], [place] [address]  . WBC 07/30/2020 6.0  4.0 - 10.5 K/uL Final  . RBC 07/30/2020 4.82  3.87 - 5.11 MIL/uL Final  . Hemoglobin 07/30/2020 14.4  12.0 - 15.0 g/dL Final  . HCT 07/30/2020 43.5  36 - 46 % Final  . MCV 07/30/2020 90.2  80.0 - 100.0 fL Final  . MCH 07/30/2020 29.9  26.0 - 34.0 pg Final  . MCHC 07/30/2020 33.1  30.0 - 36.0 g/dL Final  . RDW 07/30/2020 14.1  11.5 - 15.5 % Final  . Platelets 07/30/2020 238  150 - 400 K/uL Final  . nRBC 07/30/2020 0.0  0.0 - 0.2 % Final  . Neutrophils Relative % 07/30/2020 63  % Final  . Neutro Abs 07/30/2020 3.8  1.7 - 7.7 K/uL Final  . Lymphocytes Relative 07/30/2020 23  % Final  . Lymphs Abs 07/30/2020 1.4  0.7 - 4.0 K/uL Final  . Monocytes Relative 07/30/2020 11  % Final  . Monocytes Absolute 07/30/2020 0.7  0.1 - 1.0 K/uL Final  . Eosinophils Relative 07/30/2020 2  % Final  . Eosinophils Absolute 07/30/2020 0.1  0.0 - 0.5 K/uL Final  . Basophils Relative 07/30/2020 1  % Final  . Basophils Absolute 07/30/2020 0.0  0.0 - 0.1 K/uL Final  . Immature Granulocytes 07/30/2020 0  % Final  . Abs Immature Granulocytes 07/30/2020 0.02  0.00 - 0.07 K/uL Final   Performed at [facility], [address], [place], [place] [address]    Assessment:  Rebecca Faulkner is a 79 y.o. female with stage IA right breast cancer s/p right modified radical mastectomy with sentinel lymph node biopsy on 05/27/2012.  Pathology revealed a 1.8 cm grade I invasive carcinoma.  There was angiolymphatic invasion.  There was low grade DCIS.  One sentinel node and 5 non-sentinel nodes were removed.  Zero of 6 nodes were positive.  Tumor was ER + (90%). PR + (90%). HER-2 receptor - by FISH.  Pathologic stage was T1cN0M0 breast cancer.  Oncotype DX score was 10 which translated into a 7% chance of recurrent disease in 10 years of tamoxifen.  She began Femara in 07/2012.  She stopped Femara in  01/2018.  Left mammogram on 05/18/2018 revealed no evidence of malignancy.  Ultrasound on 06/01/2018 revealed no discernible cystic or solid lesion, area of architectural distortion or unusual vascularity. Screening left mammogram on  07/05/2019 revealed possible asymmetry in the left breast.  Diagnostic left mammogram and ultrasound on 07/18/2019 revealed a probably benign asymmetry within the left breast demonstrated best on the ML and MLO views.  Ultrasound revealed no discrete solid or cystic lesion.  Diagnostic mammogram on 01/17/2020 revealed decreased conspicuity of a probably benign asymmetry in the slightly upper left breast. There was no sonographic correlate on the prior exam. Follow-up diagnostic mammogram in 6 months was recommended.  Left diagnostic mammogram on 09/03/2020 revealed a stable likely benign asymmetry in the UPPER central LEFT breast dating back to 06/2019.  CA27.29 has been followed:  14.3 on 02/02/2013, 13.9 on 08/11/2013, 10.1 on 02/20/2014, 14.3 on 08/22/2014, 5.6 on 07/01/2016, 14.0 on 01/19/2017, 8.0 on 07/28/2017, 11 on 01/26/2018, 12.4 on 07/27/2018, 13.5 on 07/29/2019, and 11.9 on 07/30/2020.  She was admitted with a right femur fracture s/p fall in 12/2014.  She was on [place], calcium and vitamin D.  She was on Fosamax.  Bone density on 08/08/2014 revealed osteoporosis with a T score of -2.9 in the left femur, -1.6 in the left forearm radius.  Bone density on 05/12/2016 revealed osteoporosis with a T score of -2.7 in the left femur, -1.4 in the left forearm radius.  Bone density on 05/18/2018 revealed osteoporosis with a T-score of -2.8 in the left femur and - 1.4 in the left forearm radius.  Bone density on 09/03/2020 revealed osteoporosis with a T-score of -2.6 at the femur neck.  Abdomen and pelvis CT on 03/07/2020 for body aches revealed very mild acute sigmoid diverticulitis. There was no evidence of perforation or abscess.  There was a 4.0 cm benign-appearing  cyst in the left adnexa.  Pelvis ultrasound on 03/21/2020 revealed a 4.2 cm simple left adnexal cyst, likely an ovarian cyst. This had benign characteristics. Transabdominal pelvic ultrasound on 09/21/2020 revealed a stable left adnexal cyst, likely ovarian in origin. Right ovary was not visualized.  CA125 was 16.9 on 07/30/2020.  Colonoscopy on 07/22/2017 revealed diverticulosis in the sigmoid colon and one 5 mm polyp in the rectum.  Pathology revealed a tubular adenoma.  She received the [place] COVID-19 vaccine on 11/23/2019 and 12/14/2019.  Symptomatically, she feels good. She has had on and off lightheadedness and loss of taste/smell since her COVID-19 infection. Her right breast tenderness and itching comes and goes. She denies any new symptoms or complaints.  Exam is stable.  Plan: 1.   Review labs from 07/30/2020. 2.   Stage IA right breast cancer Clinically, she is doing well. Exam on 07/30/2020 revealed no evidence of disease. Left diagnostic mammogram on 09/03/2020 was stable. She has been off Femara since 01/2018. Continue to monitor. 3.   Left adnexal cyst  Abdomen and pelvis CT on 03/07/2020 and pelvic ultrasound on 03/21/2020 revealed a 4 cm benign appearing left adnexal cyst.  Pelvic ultrasound on 09/21/2020 revealed a stable adnexal cysts likely ovarian in origin.  CA-125 was 16.9 on 07/30/2020.  Discuss plans for pelvic ultrasound in 03/21/2021. 4.   Osteoporosis Bone density on 09/03/2020 revealed osteoporosis. Encourage calcium and vitamin D. Discuss consideration of Prolia, Xgeva, Fosamax, Actonel or Boniva.  Information provided.    Dental clearance is needed. 5.   Pelvis ultrasound on 03/21/2021. 6.   Mammogram on 09/03/2021. 7.   Patient to decide about Prolia/Xgeva/Fosmax (needs dental clearance). 8.   RTC after ultrasound for MD assessment, labs (CBC with diff, CMP, CA125- day before appt), and review of imaging.  I discussed the assessment and  treatment plan  with the patient.  The patient was provided an opportunity to ask questions and all were answered.  The patient agreed with the plan and demonstrated an understanding of the instructions.  The patient was advised to call back if the symptoms worsen or if the condition fails to improve as anticipated.   Lequita Asal, MD, PhD    09/24/2020, 1:35 PM  I, Evert Kohl, am acting as a Education administrator for Lequita Asal, MD.  I, [place] Mike Gip, MD, have reviewed the above documentation for accuracy and completeness, and I agree with the above.

## 2020-09-24 ENCOUNTER — Encounter: Payer: Self-pay | Admitting: Hematology and Oncology

## 2020-09-24 ENCOUNTER — Other Ambulatory Visit: Payer: Self-pay

## 2020-09-24 ENCOUNTER — Other Ambulatory Visit: Payer: Self-pay | Admitting: Hematology and Oncology

## 2020-09-24 ENCOUNTER — Inpatient Hospital Stay: Payer: Medicare Other | Attending: Hematology and Oncology | Admitting: Hematology and Oncology

## 2020-09-24 VITALS — BP 117/66 | HR 97 | Temp 98.3°F | Resp 16 | Wt 149.0 lb

## 2020-09-24 DIAGNOSIS — M818 Other osteoporosis without current pathological fracture: Secondary | ICD-10-CM | POA: Insufficient documentation

## 2020-09-24 DIAGNOSIS — M810 Age-related osteoporosis without current pathological fracture: Secondary | ICD-10-CM

## 2020-09-24 DIAGNOSIS — Z79899 Other long term (current) drug therapy: Secondary | ICD-10-CM | POA: Insufficient documentation

## 2020-09-24 DIAGNOSIS — N83202 Unspecified ovarian cyst, left side: Secondary | ICD-10-CM | POA: Diagnosis not present

## 2020-09-24 DIAGNOSIS — C50911 Malignant neoplasm of unspecified site of right female breast: Secondary | ICD-10-CM | POA: Diagnosis not present

## 2020-09-24 DIAGNOSIS — Z170 Estrogen receptor positive status [ER+]: Secondary | ICD-10-CM | POA: Diagnosis not present

## 2020-09-24 DIAGNOSIS — C50411 Malignant neoplasm of upper-outer quadrant of right female breast: Secondary | ICD-10-CM | POA: Diagnosis not present

## 2020-09-24 NOTE — Patient Instructions (Signed)
Alendronate tablets What is this medicine? ALENDRONATE (a LEN droe nate) slows calcium loss from bones. It helps to make normal healthy bone and to slow bone loss in people with Paget's disease and osteoporosis. It may be used in others at risk for bone loss. This medicine may be used for other purposes; ask your health care provider or pharmacist if you have questions. COMMON BRAND NAME(S): Fosamax What should I tell my health care provider before I take this medicine? They need to know if you have any of these conditions:  dental disease  esophagus, stomach, or intestine problems, like acid reflux or GERD  kidney disease  low blood calcium  low vitamin D  problems sitting or standing 30 minutes  trouble swallowing  an unusual or allergic reaction to alendronate, other medicines, foods, dyes, or preservatives  pregnant or trying to get pregnant  breast-feeding How should I use this medicine? You must take this medicine exactly as directed or you will lower the amount of the medicine you absorb into your body or you may cause yourself harm. Take this medicine by mouth first thing in the morning, after you are up for the day. Do not eat or drink anything before you take your medicine. Swallow the tablet with a full glass (6 to 8 fluid ounces) of plain water. Do not take this medicine with any other drink. Do not chew or crush the tablet. After taking this medicine, do not eat breakfast, drink, or take any medicines or vitamins for at least 30 minutes. Sit or stand up for at least 30 minutes after you take this medicine; do not lie down. Do not take your medicine more often than directed. Talk to your pediatrician regarding the use of this medicine in children. Special care may be needed. Overdosage: If you think you have taken too much of this medicine contact a poison control center or emergency room at once. NOTE: This medicine is only for you. Do not share this medicine with  others. What if I miss a dose? If you miss a dose, do not take it later in the day. Continue your normal schedule starting the next morning. Do not take double or extra doses. What may interact with this medicine?  aluminum hydroxide  antacids  aspirin  calcium supplements  drugs for inflammation like ibuprofen, naproxen, and others  iron supplements  magnesium supplements  vitamins with minerals This list may not describe all possible interactions. Give your health care provider a list of all the medicines, herbs, non-prescription drugs, or dietary supplements you use. Also tell them if you smoke, drink alcohol, or use illegal drugs. Some items may interact with your medicine. What should I watch for while using this medicine? Visit your doctor or health care professional for regular checks ups. It may be some time before you see benefit from this medicine. Do not stop taking your medicine except on your doctor's advice. Your doctor or health care professional may order blood tests and other tests to see how you are doing. You should make sure you get enough calcium and vitamin D while you are taking this medicine, unless your doctor tells you not to. Discuss the foods you eat and the vitamins you take with your health care professional. Some people who take this medicine have severe bone, joint, and/or muscle pain. This medicine may also increase your risk for a broken thigh bone. Tell your doctor right away if you have pain in your upper leg or  groin. Tell your doctor if you have any pain that does not go away or that gets worse. This medicine can make you more sensitive to the sun. If you get a rash while taking this medicine, sunlight may cause the rash to get worse. Keep out of the sun. If you cannot avoid being in the sun, wear protective clothing and use sunscreen. Do not use sun lamps or tanning beds/booths. What side effects may I notice from receiving this medicine? Side effects  that you should report to your doctor or health care professional as soon as possible:  allergic reactions like skin rash, itching or hives, swelling of the face, lips, or tongue  black or tarry stools  bone, muscle or joint pain  changes in vision  chest pain  heartburn or stomach pain  jaw pain, especially after dental work  pain or trouble when swallowing  redness, blistering, peeling or loosening of the skin, including inside the mouth Side effects that usually do not require medical attention (report to your doctor or health care professional if they continue or are bothersome):  changes in taste  diarrhea or constipation  eye pain or itching  headache  nausea or vomiting  stomach gas or fullness This list may not describe all possible side effects. Call your doctor for medical advice about side effects. You may report side effects to FDA at 1-800-FDA-1088. Where should I keep my medicine? Keep out of the reach of children. Store at room temperature of 15 and 30 degrees C (59 and 86 degrees F). Throw away any unused medicine after the expiration date. NOTE: This sheet is a summary. It may not cover all possible information. If you have questions about this medicine, talk to your doctor, pharmacist, or health care provider.  2020 Elsevier/Gold Standard (2011-04-11 08:56:09)   Zoledronic Acid injection (Hypercalcemia, Oncology) What is this medicine? ZOLEDRONIC ACID (ZOE le dron ik AS id) lowers the amount of calcium loss from bone. It is used to treat too much calcium in your blood from cancer. It is also used to prevent complications of cancer that has spread to the bone. This medicine may be used for other purposes; ask your health care provider or pharmacist if you have questions. COMMON BRAND NAME(S): Zometa What should I tell my health care provider before I take this medicine? They need to know if you have any of these conditions:  aspirin-sensitive  asthma  cancer, especially if you are receiving medicines used to treat cancer  dental disease or wear dentures  infection  kidney disease  receiving corticosteroids like dexamethasone or prednisone  an unusual or allergic reaction to zoledronic acid, other medicines, foods, dyes, or preservatives  pregnant or trying to get pregnant  breast-feeding How should I use this medicine? This medicine is for infusion into a vein. It is given by a health care professional in a hospital or clinic setting. Talk to your pediatrician regarding the use of this medicine in children. Special care may be needed. Overdosage: If you think you have taken too much of this medicine contact a poison control center or emergency room at once. NOTE: This medicine is only for you. Do not share this medicine with others. What if I miss a dose? It is important not to miss your dose. Call your doctor or health care professional if you are unable to keep an appointment. What may interact with this medicine?  certain antibiotics given by injection  NSAIDs, medicines for pain and inflammation,  like ibuprofen or naproxen  some diuretics like bumetanide, furosemide  teriparatide  thalidomide This list may not describe all possible interactions. Give your health care provider a list of all the medicines, herbs, non-prescription drugs, or dietary supplements you use. Also tell them if you smoke, drink alcohol, or use illegal drugs. Some items may interact with your medicine. What should I watch for while using this medicine? Visit your doctor or health care professional for regular checkups. It may be some time before you see the benefit from this medicine. Do not stop taking your medicine unless your doctor tells you to. Your doctor may order blood tests or other tests to see how you are doing. Women should inform their doctor if they wish to become pregnant or think they might be pregnant. There is a potential  for serious side effects to an unborn child. Talk to your health care professional or pharmacist for more information. You should make sure that you get enough calcium and vitamin D while you are taking this medicine. Discuss the foods you eat and the vitamins you take with your health care professional. Some people who take this medicine have severe bone, joint, and/or muscle pain. This medicine may also increase your risk for jaw problems or a broken thigh bone. Tell your doctor right away if you have severe pain in your jaw, bones, joints, or muscles. Tell your doctor if you have any pain that does not go away or that gets worse. Tell your dentist and dental surgeon that you are taking this medicine. You should not have major dental surgery while on this medicine. See your dentist to have a dental exam and fix any dental problems before starting this medicine. Take good care of your teeth while on this medicine. Make sure you see your dentist for regular follow-up appointments. What side effects may I notice from receiving this medicine? Side effects that you should report to your doctor or health care professional as soon as possible:  allergic reactions like skin rash, itching or hives, swelling of the face, lips, or tongue  anxiety, confusion, or depression  breathing problems  changes in vision  eye pain  feeling faint or lightheaded, falls  jaw pain, especially after dental work  mouth sores  muscle cramps, stiffness, or weakness  redness, blistering, peeling or loosening of the skin, including inside the mouth  trouble passing urine or change in the amount of urine Side effects that usually do not require medical attention (report to your doctor or health care professional if they continue or are bothersome):  bone, joint, or muscle pain  constipation  diarrhea  fever  hair loss  irritation at site where injected  loss of appetite  nausea, vomiting  stomach  upset  trouble sleeping  trouble swallowing  weak or tired This list may not describe all possible side effects. Call your doctor for medical advice about side effects. You may report side effects to FDA at 1-800-FDA-1088. Where should I keep my medicine? This drug is given in a hospital or clinic and will not be stored at home. NOTE: This sheet is a summary. It may not cover all possible information. If you have questions about this medicine, talk to your doctor, pharmacist, or health care provider.  2020 Elsevier/Gold Standard (2014-03-11 14:19:39)   Denosumab injection What is this medicine? DENOSUMAB (den oh sue mab) slows bone breakdown. Prolia is used to treat osteoporosis in women after menopause and in men, and in people  who are taking corticosteroids for 6 months or more. Delton See is used to treat a high calcium level due to cancer and to prevent bone fractures and other bone problems caused by multiple myeloma or cancer bone metastases. Delton See is also used to treat giant cell tumor of the bone. This medicine may be used for other purposes; ask your health care provider or pharmacist if you have questions. COMMON BRAND NAME(S): Prolia, XGEVA What should I tell my health care provider before I take this medicine? They need to know if you have any of these conditions:  dental disease  having surgery or tooth extraction  infection  kidney disease  low levels of calcium or Vitamin D in the blood  malnutrition  on hemodialysis  skin conditions or sensitivity  thyroid or parathyroid disease  an unusual reaction to denosumab, other medicines, foods, dyes, or preservatives  pregnant or trying to get pregnant  breast-feeding How should I use this medicine? This medicine is for injection under the skin. It is given by a health care professional in a hospital or clinic setting. A special MedGuide will be given to you before each treatment. Be sure to read this information  carefully each time. For Prolia, talk to your pediatrician regarding the use of this medicine in children. Special care may be needed. For Delton See, talk to your pediatrician regarding the use of this medicine in children. While this drug may be prescribed for children as young as 13 years for selected conditions, precautions do apply. Overdosage: If you think you have taken too much of this medicine contact a poison control center or emergency room at once. NOTE: This medicine is only for you. Do not share this medicine with others. What if I miss a dose? It is important not to miss your dose. Call your doctor or health care professional if you are unable to keep an appointment. What may interact with this medicine? Do not take this medicine with any of the following medications:  other medicines containing denosumab This medicine may also interact with the following medications:  medicines that lower your chance of fighting infection  steroid medicines like prednisone or cortisone This list may not describe all possible interactions. Give your health care provider a list of all the medicines, herbs, non-prescription drugs, or dietary supplements you use. Also tell them if you smoke, drink alcohol, or use illegal drugs. Some items may interact with your medicine. What should I watch for while using this medicine? Visit your doctor or health care professional for regular checks on your progress. Your doctor or health care professional may order blood tests and other tests to see how you are doing. Call your doctor or health care professional for advice if you get a fever, chills or sore throat, or other symptoms of a cold or flu. Do not treat yourself. This drug may decrease your body's ability to fight infection. Try to avoid being around people who are sick. You should make sure you get enough calcium and vitamin D while you are taking this medicine, unless your doctor tells you not to. Discuss the  foods you eat and the vitamins you take with your health care professional. See your dentist regularly. Brush and floss your teeth as directed. Before you have any dental work done, tell your dentist you are receiving this medicine. Do not become pregnant while taking this medicine or for 5 months after stopping it. Talk with your doctor or health care professional about your birth  control options while taking this medicine. Women should inform their doctor if they wish to become pregnant or think they might be pregnant. There is a potential for serious side effects to an unborn child. Talk to your health care professional or pharmacist for more information. What side effects may I notice from receiving this medicine? Side effects that you should report to your doctor or health care professional as soon as possible:  allergic reactions like skin rash, itching or hives, swelling of the face, lips, or tongue  bone pain  breathing problems  dizziness  jaw pain, especially after dental work  redness, blistering, peeling of the skin  signs and symptoms of infection like fever or chills; cough; sore throat; pain or trouble passing urine  signs of low calcium like fast heartbeat, muscle cramps or muscle pain; pain, tingling, numbness in the hands or feet; seizures  unusual bleeding or bruising  unusually weak or tired Side effects that usually do not require medical attention (report to your doctor or health care professional if they continue or are bothersome):  constipation  diarrhea  headache  joint pain  loss of appetite  muscle pain  runny nose  tiredness  upset stomach This list may not describe all possible side effects. Call your doctor for medical advice about side effects. You may report side effects to FDA at 1-800-FDA-1088. Where should I keep my medicine? This medicine is only given in a clinic, doctor's office, or other health care setting and will not be stored  at home. NOTE: This sheet is a summary. It may not cover all possible information. If you have questions about this medicine, talk to your doctor, pharmacist, or health care provider.  2020 Elsevier/Gold Standard (2018-02-19 16:10:44)

## 2020-09-24 NOTE — Progress Notes (Signed)
Patient here for results  no concerns today. 

## 2020-09-27 ENCOUNTER — Telehealth: Payer: Self-pay

## 2020-09-27 NOTE — Telephone Encounter (Signed)
I had a note here from Monday to call patient because she couldn't remember who her dentist was and we needed the info for a dental clearance for prolia/xgeva. I called the patient and she states the issue is not that she cant remember, its that she doesn't have a dentist at all. I advised her to contact us when she gets established with a dentist.

## 2020-11-30 NOTE — Progress Notes (Deleted)
Name: Rebecca Faulkner   MRN: [id number]    DOB: 07/06/1941   Date:11/30/2020       Progress Note  Subjective  Chief Complaint  Follow Up  HPI  History of breast cancer - Right side s/p mastectomy in 2013, used to see Dr. Polly Cobia, now has yearly follow ups with Dr. Mike Gip last  mammogram of left breast March 2021 repeat in 6 months. No breast lumps, discomfort of nipple discharge   Atherosclerosis of Aorta: CT scan 03/07/2020 , we sent Pravastatin but could not tolerate it - caused constipation. She states she went 5 days without a bowel movement, back to normal now since she stopped medication, she is willing to try a different type.   Osteoporosis:  She is due for repeat bone density  Hyperglycemia: denies polyphagia,polydipsia or polyuria , we will check A1C  Vitamin D and B12 deficiency: she is taking supplementation again   History of alcoholism: she is no longer drinking, she remained sober during the pandemic She quit about 5 years ago  MDD in partial remission: feels lonely, does not like one of the co-workers, no crying spells, but feels down, does not want medication. She was very depressed in the past, recurrent symptoms.   Ovarian cyst: US done after CT for diverticulitis and abnormal findings, she will go back for repeat US in 6 months   IMPRESSION: 1. 4.2 cm simple left adnexal cyst, likely an ovarian cyst. This has benign characteristics, but given its size, follow up by ultrasound is recommended in 6 months. This recommendation follows consensus guidelines: Simple Adnexal Cysts: SRU Consensus Conference Update on Follow-up and Reporting. Radiology 2019; [id number]. 2. 1.9 cm intramural fibroid at the anterior uterine fundus. 3. Nonvisualization of the right ovary. No other adnexal mass or free fluid.  Patient Active Problem List   Diagnosis Date Noted  . History of diverticulitis 03/12/2020  . Left ovarian cyst 03/12/2020  . Atherosclerosis of aorta ([place])  03/12/2020  . Osteoporosis 08/05/2019  . Closed comminuted intertrochanteric fracture of proximal femur ([place]) 07/27/2018  . Mass of upper outer quadrant of left breast 06/01/2018  . Goals of care, counseling/discussion 01/31/2018  . Osteoporosis without current pathological fracture 07/28/2017  . Depression, major, recurrent, mild ([place]) 07/07/2017  . B12 deficiency 07/07/2017  . Vitamin D deficiency 07/07/2017  . Screening for colon cancer 05/27/2017  . Malignant neoplasm of upper-outer quadrant of right breast in female, estrogen receptor positive ([place]) 05/27/2017  . Glaucoma 05/26/2017  . History of alcoholism ([employer]) 05/26/2017  . History of hip fracture 05/10/2015  . Breast cancer, right ([place]) 05/27/2012  . Estrogen receptor positive status (ER+) 05/14/2012    Past Surgical History:  Procedure Laterality Date  . BREAST SURGERY Right 2013  . CATARACT EXTRACTION W/PHACO Right 04/23/2016   Procedure: CATARACT EXTRACTION PHACO AND INTRAOCULAR LENS PLACEMENT ([place]) RIGHT EYE;  Surgeon: Leandrew Koyanagi, MD;  Location: [place];  Service: Ophthalmology;  Laterality: Right;  . CATARACT EXTRACTION W/PHACO Left 06/04/2016   Procedure: CATARACT EXTRACTION PHACO AND INTRAOCULAR LENS PLACEMENT ([facility]);  Surgeon: Leandrew Koyanagi, MD;  Location: [place];  Service: Ophthalmology;  Laterality: Left;  . [place]  . COLONOSCOPY WITH PROPOFOL N/A 07/22/2017   Procedure: COLONOSCOPY WITH PROPOFOL;  Surgeon: Robert Bellow, MD;  Location: [facility];  Service: Endoscopy;  Laterality: N/A;  . FRACTURE SURGERY Left 2011   arm, plate and 12 screws  . LEG SURGERY Right 01/02/2015  . MASTECTOMY Right 2013  .  Right inguinal hernia repair  2013  . VEIN SURGERY Left 2015    Family History  Problem Relation Age of Onset  . Dementia Mother   . Stroke Father   . Diabetes Brother   . Breast cancer Neg Hx     Social History   Tobacco Use  . Smoking status:  Never Smoker  . Smokeless tobacco: Never Used  Substance Use Topics  . Alcohol use: No    Comment: Quit drinking  11/04/2016     Current Outpatient Medications:  .  calcium carbonate (OS-CAL) 600 MG TABS, Take 600 mg by mouth 2 (two) times daily with a meal., Disp: , Rfl:  .  rosuvastatin (CRESTOR) 5 MG tablet, Take 1 tablet (5 mg total) by mouth daily., Disp: 30 tablet, Rfl: 0 .  vitamin B-12 (CYANOCOBALAMIN) 1000 MCG tablet, Take 1,000 mcg by mouth daily., Disp: , Rfl:   Allergies  Allergen Reactions  . Codeine Nausea Only  . Pravastatin     Constipation     I personally reviewed {Reviewed:14835} with the patient/caregiver today.   ROS  ***  Objective  There were no vitals filed for this visit.  There is no height or weight on file to calculate BMI.  Physical Exam ***  No results found for this or any previous visit (from the past 2160 hour(s)).  Diabetic Foot Exam: Diabetic Foot Exam - Simple   No data filed    ***  PHQ2/9: Depression screen [facility] 2/9 05/28/2020 05/08/2020 03/12/2020 11/03/2019 07/07/2017  Decreased Interest 1 0 0 0 0  Down, Depressed, Hopeless 1 0 0 0 2  PHQ - 2 Score 2 0 0 0 2  Altered sleeping 1 - 0 0 0  Tired, decreased energy 1 - 0 0 3  Change in appetite 0 - 0 0 0  Feeling bad or failure about yourself  0 - 0 0 0  Trouble concentrating 0 - 0 0 0  Moving slowly or fidgety/restless 0 - 0 0 0  Suicidal thoughts 0 - 0 0 0  PHQ-9 Score 4 - 0 0 5  Difficult doing work/chores Not difficult at all - - - Not difficult at all    phq 9 is {gen pos [id number]} ***  Fall Risk: Fall Risk  05/28/2020 05/08/2020 03/12/2020 05/26/2017  Falls in the past year? 0 0 0 No  Number falls in past yr: 0 0 0 -  Injury with Fall? 0 0 0 -  Risk for fall due to : - No Fall Risks - -  Follow up - Falls prevention discussed - -   ***   Functional Status Survey:   ***   Assessment & Plan  *** There are no diagnoses linked to this encounter.

## 2020-12-03 ENCOUNTER — Ambulatory Visit: Payer: Medicare Other | Admitting: Family Medicine

## 2020-12-07 ENCOUNTER — Other Ambulatory Visit: Payer: Medicare Other

## 2020-12-26 IMAGING — US US PELVIS COMPLETE WITH TRANSVAGINAL
1 series · 13 of 19 positions shown · non-contrast
Comparison: Prior CT from 03/07/2020.

CLINICAL DATA: Follow-up examination for cyst seen on prior CT.



[Series 1: us pelvis complete with transvaginal · 0.22mm/px · 13 of 19 slices shown]
[im 1/19]
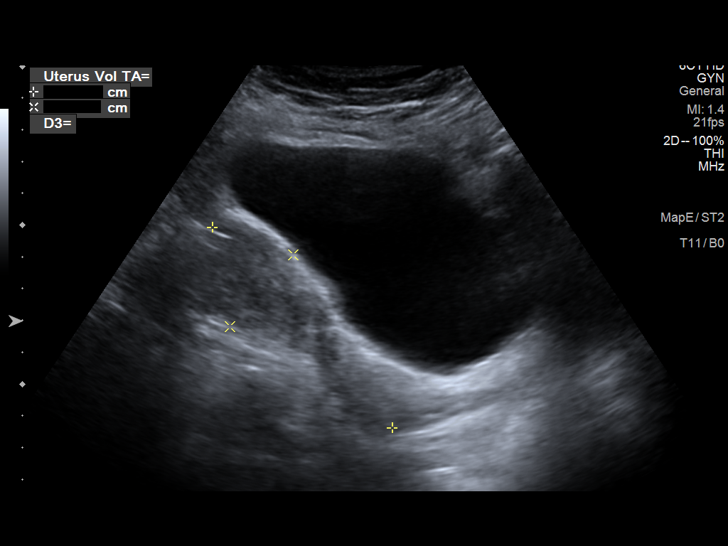
[im 3/19]
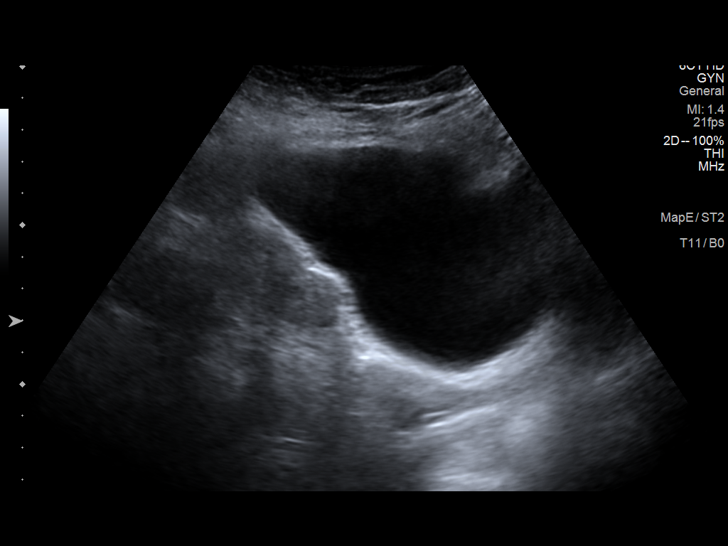
[im 4/19]
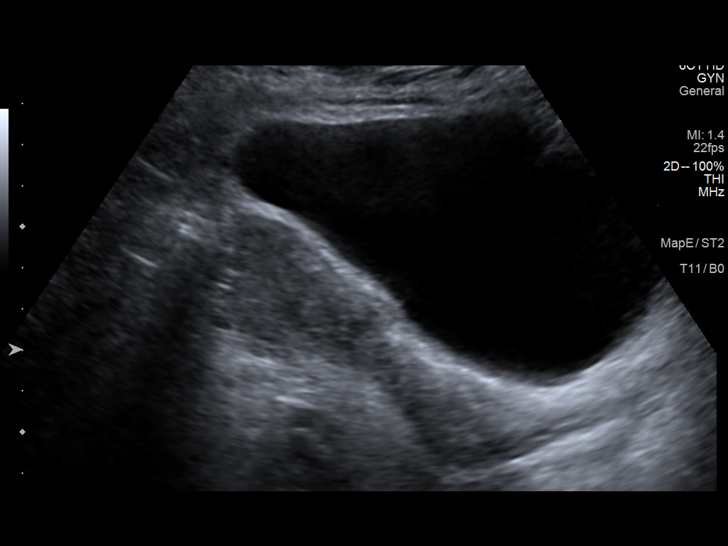
[im 6/19]
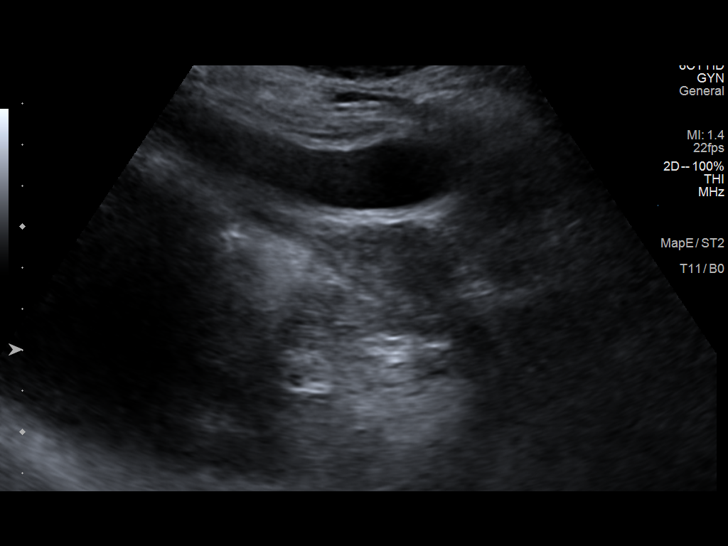
[im 7/19]
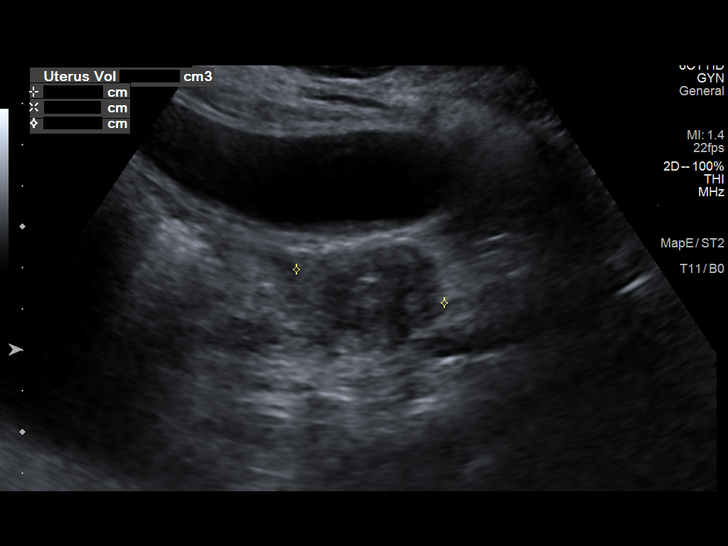
[im 9/19]
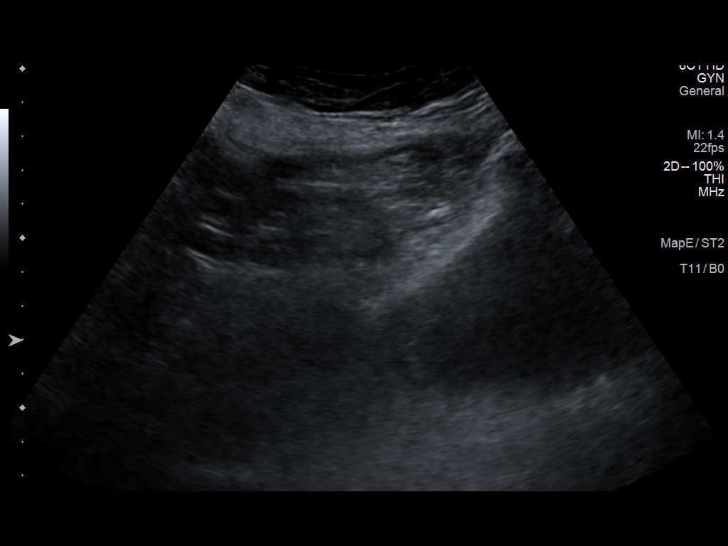
[im 10/19]
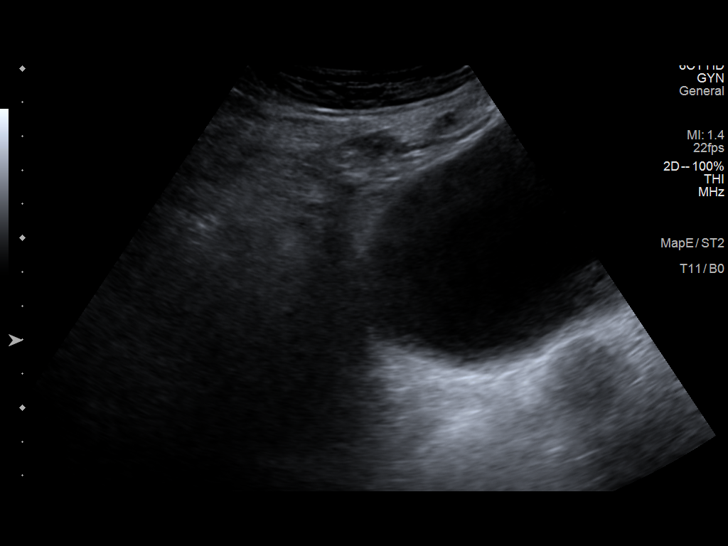
[im 11/19]
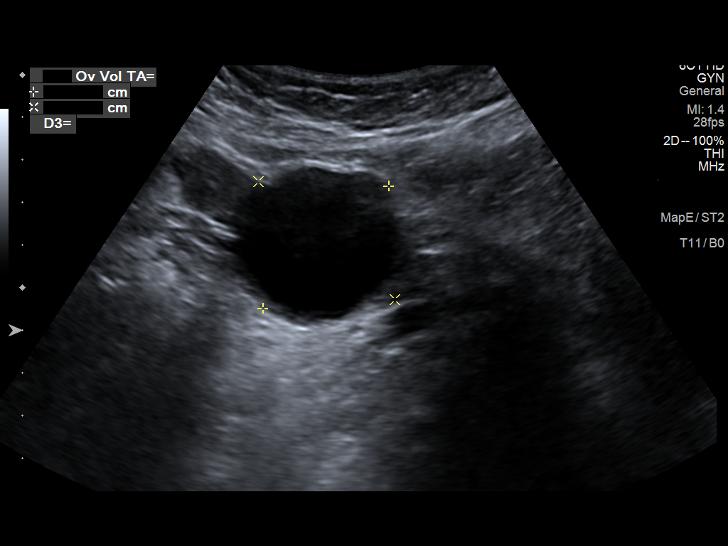
[im 13/19]
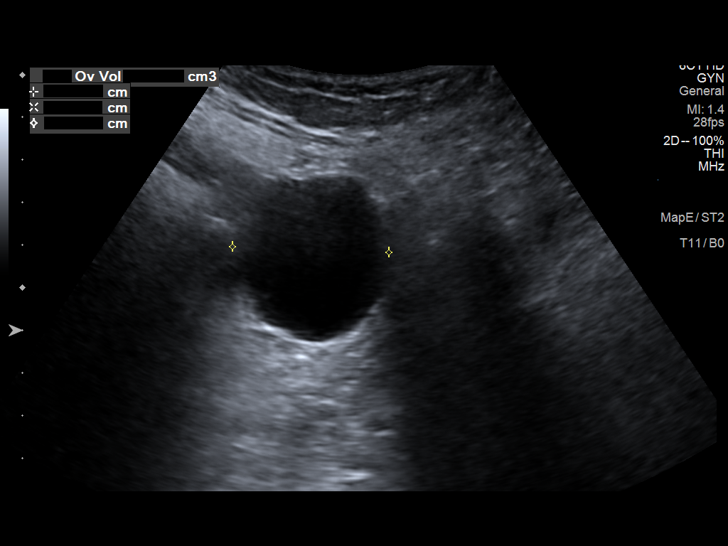
[im 14/19]
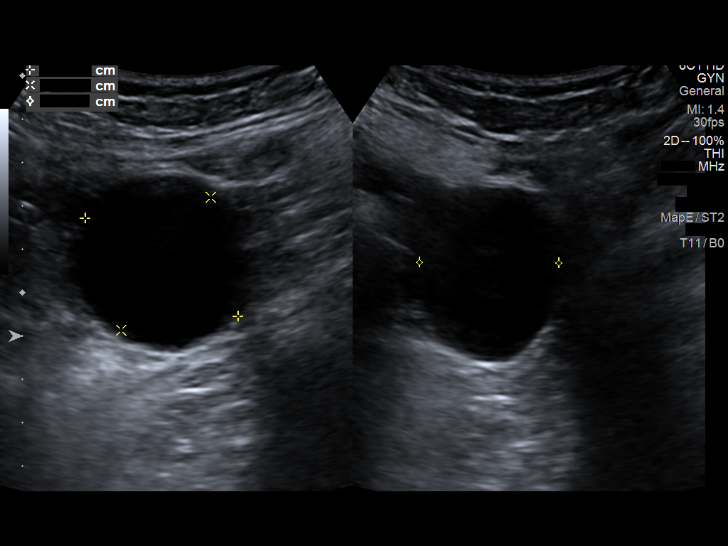
[im 16/19]
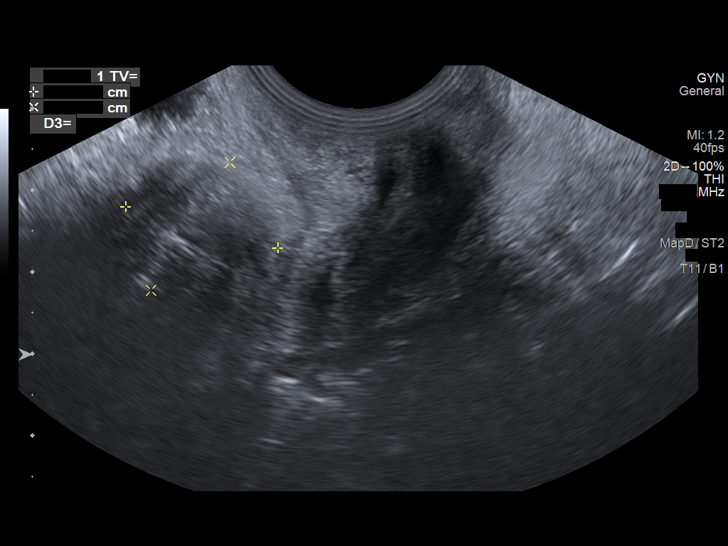
[im 17/19]
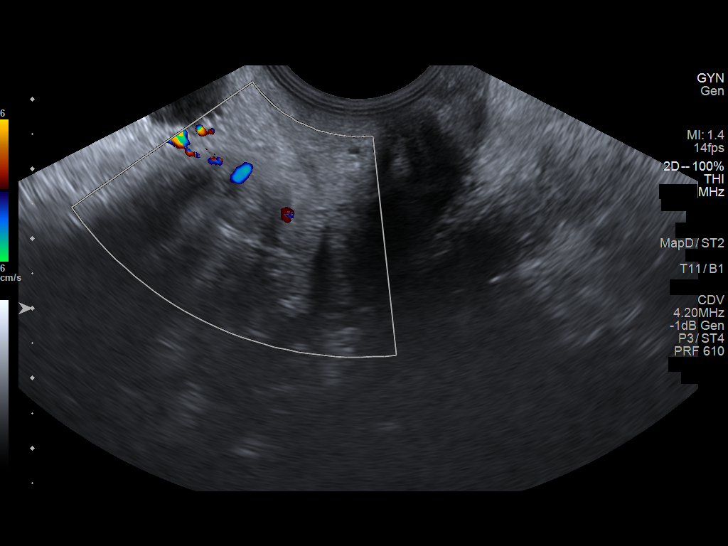
[im 19/19]
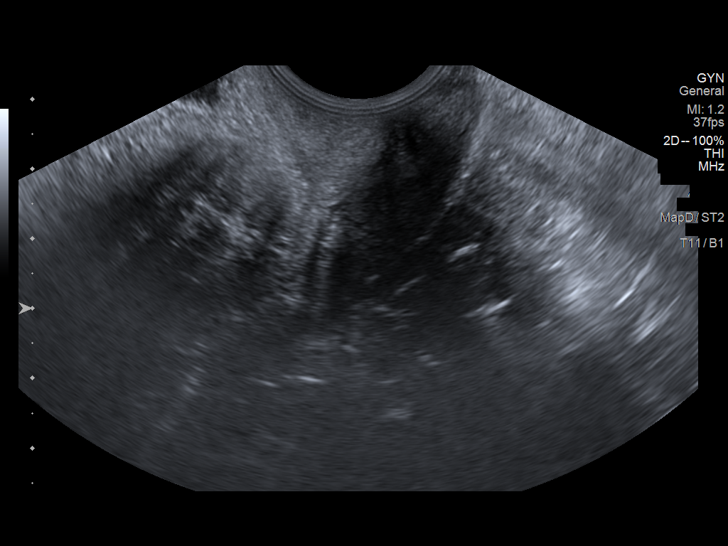

[13 of 19 positions shown; findings below may reference images not displayed]

FINDINGS: Uterus

Measurements: 8.5 x 3.0 x 3.7 cm = volume: 49 mL. 1.9 cm intramural
fibroid present at the anterior uterine fundus.

Endometrium

3.1 mm. Endometrial stripe grossly within normal limits without
focal abnormality, although visualization somewhat limited due to
overlying fibroid.

Right ovary

Not visualized.  No adnexal mass.

Left ovary

Measurements: 4.2 x 4.1 x 3.7 cm = volume: 34 mL. 4.2 x 3.7 x 3.2 cm
simple cyst, corresponding with abnormality seen on prior CT. No
internal complexity, vascularity, or solid component.

Other findings

No abnormal free fluid.
IMPRESSION: 1. 4.2 cm simple left adnexal cyst, likely an ovarian cyst. This has
benign characteristics, but given its size, follow up by ultrasound
is recommended in 6 months. This recommendation follows consensus
guidelines: Simple Adnexal Cysts: SRU Consensus Conference Update on
Follow-up and Reporting. Radiology 4317; [DATE].
2. 1.9 cm intramural fibroid at the anterior uterine fundus.
3. Nonvisualization of the right ovary. No other adnexal mass or
free fluid.

## 2021-03-26 ENCOUNTER — Ambulatory Visit: Admission: RE | Admit: 2021-03-26 | Payer: Medicare Other | Source: Ambulatory Visit

## 2021-03-27 ENCOUNTER — Other Ambulatory Visit: Payer: Medicare Other

## 2021-04-08 ENCOUNTER — Other Ambulatory Visit: Payer: Medicare Other

## 2021-04-09 ENCOUNTER — Ambulatory Visit: Payer: Medicare Other | Admitting: Oncology

## 2021-04-09 ENCOUNTER — Other Ambulatory Visit: Payer: Medicare Other

## 2021-05-09 ENCOUNTER — Ambulatory Visit: Payer: Medicare Other

## 2021-07-31 ENCOUNTER — Other Ambulatory Visit: Payer: Medicare Other

## 2021-07-31 ENCOUNTER — Ambulatory Visit: Payer: Medicare Other | Admitting: Hematology and Oncology

## 2022-04-21 ENCOUNTER — Ambulatory Visit: Payer: Self-pay | Admitting: Family Medicine

## 2022-04-21 ENCOUNTER — Encounter: Payer: Self-pay | Admitting: Internal Medicine

## 2022-04-21 ENCOUNTER — Ambulatory Visit: Payer: Self-pay | Admitting: *Deleted

## 2022-04-21 ENCOUNTER — Ambulatory Visit (INDEPENDENT_AMBULATORY_CARE_PROVIDER_SITE_OTHER): Payer: Medicare Other | Admitting: Internal Medicine

## 2022-04-21 ENCOUNTER — Ambulatory Visit
Admission: RE | Admit: 2022-04-21 | Discharge: 2022-04-21 | Disposition: A | Discharge status: Home / Self Care | Payer: Medicare Other | Source: Ambulatory Visit | Attending: Internal Medicine | Admitting: Internal Medicine

## 2022-04-21 VITALS — BP 122/78 | HR 93 | Temp 97.7°F | Resp 18 | Ht 62.0 in | Wt 147.3 lb

## 2022-04-21 DIAGNOSIS — R4789 Other speech disturbances: Secondary | ICD-10-CM | POA: Insufficient documentation

## 2022-04-21 DIAGNOSIS — I700 Atherosclerosis of aorta: Secondary | ICD-10-CM

## 2022-04-21 DIAGNOSIS — R5383 Other fatigue: Secondary | ICD-10-CM | POA: Diagnosis not present

## 2022-04-21 DIAGNOSIS — R531 Weakness: Secondary | ICD-10-CM | POA: Diagnosis not present

## 2022-04-21 DIAGNOSIS — E538 Deficiency of other specified B group vitamins: Secondary | ICD-10-CM | POA: Diagnosis not present

## 2022-04-21 DIAGNOSIS — R29898 Other symptoms and signs involving the musculoskeletal system: Secondary | ICD-10-CM | POA: Diagnosis not present

## 2022-04-21 DIAGNOSIS — F1021 Alcohol dependence, in remission: Secondary | ICD-10-CM

## 2022-04-21 DIAGNOSIS — E559 Vitamin D deficiency, unspecified: Secondary | ICD-10-CM | POA: Diagnosis not present

## 2022-04-21 DIAGNOSIS — R739 Hyperglycemia, unspecified: Secondary | ICD-10-CM | POA: Diagnosis not present

## 2022-04-21 NOTE — Progress Notes (Signed)
Acute Office Visit  Subjective:     Patient ID: Rebecca Faulkner, female    DOB: [birth year], 81 y.o.   MRN: [id number]  Chief Complaint  Patient presents with   Aphasia    Difficulty making words and using arms    HPI Patient is in today for speech difficulty/word finding difficulty for 3 days. 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 Was unable to finish the word and does not remember what the word was now.  Friend with her notes some memory changes but no acute confusion. No issues with word finding or speaking difficulties since then. Has not happened before. Denies facial droop or other acute neurologic changes. Does endorse chronic left upper extremity weakness for the last 3 months. Has a hard time lifting right arm, mild sensory changes. Does have a hsitory of alcohol use, stopped drinking about 2 years ago. Has not been seen here in 2 years, due for labs. History of multiple vitamin deficiencies, not on any medications daily now.    Review of Systems  Constitutional:  Negative for chills and fever.  HENT:  Negative for hearing loss.   Eyes:  Negative for blurred vision.  Respiratory:  Negative for shortness of breath.   Cardiovascular:  Negative for chest pain.  Gastrointestinal:  Negative for abdominal pain.  Neurological:  Positive for sensory change, speech change and weakness. Negative for dizziness and headaches.        Objective:    BP 122/78   Pulse 93   Temp 97.7 F (36.5 C)   Resp 18   Ht 5\' 2"  (1.575 m)   Wt 147 lb 4.8 oz (66.8 kg)   SpO2 94%   BMI 26.94 kg/m  BP Readings from Last 3 Encounters:  04/21/22 122/78  09/24/20 117/66  07/30/20 121/67   Wt Readings from Last 3 Encounters:  04/21/22 147 lb 4.8 oz (66.8 kg)  09/24/20 149 lb 0.5 oz (67.6 kg)  07/30/20 145 lb 13.4 oz (66.2 kg)      Physical Exam Constitutional:      Appearance: Normal  appearance.  HENT:     Head: Normocephalic and atraumatic.     Nose: Nose normal.     Mouth/Throat:     Mouth: Mucous membranes are moist.     Pharynx: Oropharynx is clear.  Eyes:     Extraocular Movements: Extraocular movements intact.     Conjunctiva/sclera: Conjunctivae normal.     Pupils: Pupils are equal, round, and reactive to light.  Neck:     Vascular: No carotid bruit.  Cardiovascular:     Rate and Rhythm: Normal rate and regular rhythm.  Pulmonary:     Effort: Pulmonary effort is normal.     Breath sounds: Normal breath sounds.  Lymphadenopathy:     Cervical: No cervical adenopathy.  Neurological:     Mental Status: She is alert.     Cranial Nerves: No cranial nerve deficit.     Sensory: Sensory deficit present.     Motor: Weakness present.     Coordination: Coordination abnormal.     Gait: Gait abnormal.     Comments: Left upper extremity weakness 4/5, mild sensation changes to pressure. Cannot do heel to shin due to hip pain, Romberg positive. Shuffling gait, states due to hip pain.  Psychiatric:  Mood and Affect: Mood normal.        Behavior: Behavior normal.     No results found for any visits on 04/21/22.      Assessment & Plan:   1. Weakness of left upper extremity/History of alcoholism (HCC)/Vitamin D deficiency/Word finding difficulty: Labs due, rule out metabolic issue/vitamin deficiencies. Stat CT head ordered to rule out CVA. Symptoms >48 hours ago.   - CBC w/Diff/Platelet - COMPLETE METABOLIC PANEL WITH GFR - TSH - B12 and Folate Panel - Vitamin B1 - Vitamin D (25 hydroxy) - HgB A1c - Lipid Profile - C-reactive protein - CT HEAD WO CONTRAST ( ); Future  2. Atherosclerosis of aorta [facility]): Cholesterol screening due.  - Lipid Profile   Return in about 1 week (around 04/28/2022).  Margarita Mail, DO

## 2022-04-21 NOTE — Telephone Encounter (Signed)
Pt's employer calling in.   Rebecca Faulkner.   She is not on pt's DPR.    Rebecca Faulkner is worried about pt.   Afraid she maybe had a stroke.   I asked Rebecca Faulkner to have pt call into Dr. Arty Baumgartner office herself since I wasn't able to discuss pt's medical condition with [facility].  Rebecca Faulkner was saying pt lives alone and so Rebecca Faulkner is worried about her.    I asked to speak with pt but Rebecca Faulkner said she wasn't there with [facility].     I again asked Rebecca Faulkner to call pt and ask her to call us.  I attempted to call [name] at [phone] however her voice mailbox is full and unable to accept messages at this time.  (Noted in her chart that she has cancelled and been a No Show for many of her appts over the last year or so).      Answer Assessment - Initial Assessment Questions 1. REASON FOR CALL or QUESTION: "What is your reason for calling today?" or "How can I best help you?" or "What question do you have that I can help answer?"     Employer Rebecca Faulkner Faulkner calling in.   Pt is not with her.   Rebecca Faulkner is worried about pt.  Rebecca Faulkner was explaining her mother had a stroke and she is concerned that the pt had a stroke this morning.  Protocols used: Information Only Call - No Triage-A-AH

## 2022-04-22 MED ORDER — VITAMIN D (ERGOCALCIFEROL) 1.25 MG (50000 UNIT) PO CAPS: 50000.0000 [IU] | ORAL_CAPSULE | ORAL | 0 refills | Status: DC

## 2022-04-23 ENCOUNTER — Telehealth: Payer: Self-pay | Admitting: Family Medicine

## 2022-04-23 NOTE — Telephone Encounter (Unsigned)
Copied from [place] [id number]. Topic: General - Other >> Apr 23, 2022  9:12 AM Everette C wrote: Reason for CRM: The patient's friend would like to speak with a member of staff when possible about the patient's most recent lab results  Please contact further when available

## 2022-04-24 LAB — LIPID PANEL
Cholesterol: 221 mg/dL — ABNORMAL HIGH (ref <200)
HDL: 65 mg/dL (ref >=50)
LDL Cholesterol (Calc): 138 mg/dL (calc) — ABNORMAL HIGH
Non-HDL Cholesterol (Calc): 156 mg/dL (calc) — ABNORMAL HIGH (ref <130)
Total CHOL/HDL Ratio: 3.4 (calc) (ref <5.0)
Triglycerides: 79 mg/dL (ref <150)

## 2022-04-24 LAB — CBC WITH DIFFERENTIAL/PLATELET
Absolute Monocytes: 810 cells/uL (ref 200–950)
Basophils Absolute: 70 cells/uL (ref 0–200)
Basophils Relative: 0.8 %
Eosinophils Absolute: 220 cells/uL (ref 15–500)
Eosinophils Relative: 2.5 %
HCT: 42.4 % (ref 35.0–45.0)
Hemoglobin: 14.4 g/dL (ref 11.7–15.5)
Lymphs Abs: 1250 cells/uL (ref 850–3900)
MCH: 30 pg (ref 27.0–33.0)
MCHC: 34 g/dL (ref 32.0–36.0)
MCV: 88.3 fL (ref 80.0–100.0)
MPV: 10.6 fL (ref 7.5–12.5)
Monocytes Relative: 9.2 %
Neutro Abs: 6450 cells/uL (ref 1500–7800)
Neutrophils Relative %: 73.3 %
Platelets: 260 10*3/uL (ref 140–400)
RBC: 4.8 10*6/uL (ref 3.80–5.10)
RDW: 12.8 % (ref 11.0–15.0)
Total Lymphocyte: 14.2 %
WBC: 8.8 10*3/uL (ref 3.8–10.8)

## 2022-04-24 LAB — HEMOGLOBIN A1C
Hgb A1c MFr Bld: 5.5 % of total Hgb (ref <5.7)
Mean Plasma Glucose: 111 mg/dL
eAG (mmol/L): 6.2 mmol/L

## 2022-04-24 LAB — B12 AND FOLATE PANEL
Folate: 7.8 ng/mL
Vitamin B-12: 330 pg/mL (ref 200–1100)

## 2022-04-24 LAB — COMPLETE METABOLIC PANEL WITH GFR
AG Ratio: 1.7 (calc) (ref 1.0–2.5)
ALT: 13 U/L (ref 6–29)
AST: 18 U/L (ref 10–35)
Albumin: 4.3 g/dL (ref 3.6–5.1)
Alkaline phosphatase (APISO): 66 U/L (ref 37–153)
BUN: 10 mg/dL (ref 7–25)
CO2: 21 mmol/L (ref 20–32)
Calcium: 9.7 mg/dL (ref 8.6–10.4)
Chloride: 108 mmol/L (ref 98–110)
Creat: 0.68 mg/dL (ref 0.60–1.00)
Globulin: 2.6 g/dL (calc) (ref 1.9–3.7)
Glucose, Bld: 86 mg/dL (ref 65–99)
Potassium: 4 mmol/L (ref 3.5–5.3)
Sodium: 140 mmol/L (ref 135–146)
Total Bilirubin: 0.4 mg/dL (ref 0.2–1.2)
Total Protein: 6.9 g/dL (ref 6.1–8.1)
eGFR: 89 mL/min/{1.73_m2} (ref >=60)

## 2022-04-24 LAB — C-REACTIVE PROTEIN: CRP: 2.1 mg/L (ref <8.0)

## 2022-04-24 LAB — TSH: TSH: 1.61 mIU/L (ref 0.40–4.50)

## 2022-04-24 LAB — VITAMIN D 25 HYDROXY (VIT D DEFICIENCY, FRACTURES): Vit D, 25-Hydroxy: 19 ng/mL — ABNORMAL LOW (ref 30–100)

## 2022-04-24 LAB — VITAMIN B1: Vitamin B1 (Thiamine): 7 nmol/L — ABNORMAL LOW (ref 8–30)

## 2022-04-24 NOTE — Telephone Encounter (Signed)
Left detailed vm °

## 2022-05-02 NOTE — Progress Notes (Signed)
Acute Office Visit  Subjective:     Patient ID: Rebecca Faulkner, female    DOB: [birth year], 81 y.o.   MRN: [id number]  No chief complaint on file.   HPI Patient was seen in the office 04/21/2022 for speech difficulty/word finding difficulty for 3 days. Was talking to a customer at work and was able to speak normally but lost track of what she was saying in the middle of a word. Was unable to finish the word and does not remember what the word was now.  Friend with her notes some memory changes but no acute confusion. No issues with word finding or speaking difficulties since then. Has not happened before. Denies facial droop or other acute neurologic changes. Does endorse chronic left upper extremity weakness for the last 3 months. Has a hard time lifting right arm, mild sensory changes. Does have a hsitory of alcohol use, stopped drinking about 2 years ago. Has not been seen here in 2 years, due for labs. History of multiple vitamin deficiencies, not on any medications daily now.   CT of the head on 04/21/2022 showed no acute intracranial abnormalities but had generalized atrophy.  Vitamin D deficiency: -Vitamin D low at 7 and 6/23 -Started on vitamin D supplements 50,000 IUs weekly for 12 weeks  HLD: -Medications: Nothing currently -Last lipid panel: Lipid Panel     Component Value Date/Time   CHOL 221 (H) 04/21/2022 1454   TRIG 79 04/21/2022 1454   HDL 65 04/21/2022 1454   CHOLHDL 3.4 04/21/2022 1454   LDLCALC 138 (H) 04/21/2022 1454   The 10-year ASCVD risk score (Arnett DK, et al., 2019) is: 22.7%   Values used to calculate the score:     Age: 65 years     Sex: Female     Is Non-Hispanic African American: No     Diabetic: No     Tobacco smoker: No     Systolic Blood Pressure: 093 mmHg     Is BP treated: No     HDL Cholesterol: 65 mg/dL     Total Cholesterol: 221 mg/dL     Review of Systems  Constitutional:  Negative for chills and fever.  HENT:  Negative for  hearing loss.   Eyes:  Negative for blurred vision.  Respiratory:  Negative for shortness of breath.   Cardiovascular:  Negative for chest pain.  Gastrointestinal:  Negative for abdominal pain.  Neurological:  Positive for sensory change, speech change and weakness. Negative for dizziness and headaches.        Objective:    There were no vitals taken for this visit. BP Readings from Last 3 Encounters:  04/21/22 122/78  09/24/20 117/66  07/30/20 121/67   Wt Readings from Last 3 Encounters:  04/21/22 147 lb 4.8 oz (66.8 kg)  09/24/20 149 lb 0.5 oz (67.6 kg)  07/30/20 145 lb 13.4 oz (66.2 kg)      Physical Exam Constitutional:      Appearance: Normal appearance.  HENT:     Head: Normocephalic and atraumatic.     Nose: Nose normal.     Mouth/Throat:     Mouth: Mucous membranes are moist.     Pharynx: Oropharynx is clear.  Eyes:     Extraocular Movements: Extraocular movements intact.     Conjunctiva/sclera: Conjunctivae normal.     Pupils: Pupils are equal, round, and reactive to light.  Neck:     Vascular: No carotid bruit.  Cardiovascular:  Rate and Rhythm: Normal rate and regular rhythm.  Pulmonary:     Effort: Pulmonary effort is normal.     Breath sounds: Normal breath sounds.  Lymphadenopathy:     Cervical: No cervical adenopathy.  Neurological:     Mental Status: She is alert.     Cranial Nerves: No cranial nerve deficit.     Sensory: Sensory deficit present.     Motor: Weakness present.     Coordination: Coordination abnormal.     Gait: Gait abnormal.     Comments: Left upper extremity weakness 4/5, mild sensation changes to pressure. Cannot do heel to shin due to hip pain, Romberg positive. Shuffling gait, states due to hip pain.  Psychiatric:        Mood and Affect: Mood normal.        Behavior: Behavior normal.     No results found for any visits on 05/05/22.      Assessment & Plan:   1. Weakness of left upper extremity/History of  alcoholism (HCC)/Vitamin D deficiency/Word finding difficulty: Labs due, rule out metabolic issue/vitamin deficiencies. Stat CT head ordered to rule out CVA. Symptoms >48 hours ago.   - CBC w/Diff/Platelet - COMPLETE METABOLIC PANEL WITH GFR - TSH - B12 and Folate Panel - Vitamin B1 - Vitamin D (25 hydroxy) - HgB A1c - Lipid Profile - C-reactive protein - CT HEAD WO CONTRAST (5MM); Future  2. Atherosclerosis of aorta [facility]): Cholesterol screening due.  - Lipid Profile   No follow-ups on file.  Teodora Medici, DO

## 2022-05-05 ENCOUNTER — Ambulatory Visit: Payer: Medicare Other | Admitting: Internal Medicine

## 2022-05-05 ENCOUNTER — Encounter: Payer: Self-pay | Admitting: Internal Medicine

## 2022-05-05 VITALS — BP 126/80 | HR 87 | Temp 97.9°F | Resp 18 | Ht 62.0 in | Wt 147.7 lb

## 2022-05-05 DIAGNOSIS — R413 Other amnesia: Secondary | ICD-10-CM | POA: Diagnosis not present

## 2022-05-05 DIAGNOSIS — E559 Vitamin D deficiency, unspecified: Secondary | ICD-10-CM | POA: Diagnosis not present

## 2022-05-05 DIAGNOSIS — I700 Atherosclerosis of aorta: Secondary | ICD-10-CM | POA: Diagnosis not present

## 2022-05-05 DIAGNOSIS — E782 Mixed hyperlipidemia: Secondary | ICD-10-CM | POA: Diagnosis not present

## 2022-05-05 MED ORDER — SIMVASTATIN 20 MG PO TABS: 20.0000 mg | ORAL_TABLET | Freq: Every day | ORAL | 0 refills | Status: DC

## 2022-05-05 NOTE — Patient Instructions (Addendum)
It was great seeing you today!  Plan discussed at today's visit: -Continue B complex vitamin and Vitamin D as well a multivitamin -Try cholesterol medication, let me know if you have any issues with this   Follow up in: 3 months   Take care and let us know if you have any questions or concerns prior to your next visit.  Dr. Rosana Berger

## 2022-05-07 ENCOUNTER — Ambulatory Visit: Payer: Self-pay | Admitting: *Deleted

## 2022-05-07 NOTE — Telephone Encounter (Signed)
Summary: Med advice   Pt want to know if she can take her medication at 4:00pm 2 hrs before bedtime. Please advise  [phone]   simvastatin (ZOCOR) 20 MG tablet      Called DPR per request to review when to take medication zocor. No answer, LVMTCB [phone]

## 2022-05-07 NOTE — Telephone Encounter (Signed)
Jiles Garter called back Advised 4pm is appropriate to take Zocor. States easier for pt. And her self as caregiver. Reason for Disposition  Caller has medicine question only, adult not sick, AND triager answers question  Answer Assessment - Initial Assessment Questions 1. NAME of MEDICATION: "What medicine are you calling about?"     Zocor 2. QUESTION: "What is your question?" (e.g., double dose of medicine, side effect)     When to take  Protocols used: Medication Question Call-A-AH

## 2022-05-07 NOTE — Telephone Encounter (Signed)
Jiles Garter, DPR called, LVMTCB. Called pt, spoke with her and advised ok to take medication 2 hrs early as long as in evening time can take anytime just once daily. Pt verbalized understanding and didn't need further assistance.   Summary: Med advice   Pt want to know if she can take her medication at 4:00pm 2 hrs before bedtime. Please advise  [phone]   simvastatin (ZOCOR) 20 MG tablet      Reason for Disposition  Health Information question, no triage required and triager able to answer question  Answer Assessment - Initial Assessment Questions 1. REASON FOR CALL or QUESTION: "What is your reason for calling today?" or "How can I best help you?" or "What question do you have that I can help answer?"     Summary: Med advice   Pt want to know if she can take her medication at 4:00pm 2 hrs before bedtime for simvastatin.  Protocols used: Information Only Call - No Triage-A-AH

## 2022-06-27 ENCOUNTER — Ambulatory Visit: Payer: Self-pay

## 2022-06-27 NOTE — Telephone Encounter (Signed)
Pt called if new symptoms from last visit will need to go to ER.  She states same as last visit just little worse nothing new.

## 2022-06-27 NOTE — Telephone Encounter (Signed)
  Chief Complaint: Question regarding medication, intermittent rt leg and arm tingling/numbness Symptoms: At time the right arm and leg starts tingling Frequency: awhile - already seen for this Pertinent Negatives: Patient denies chest pain Disposition: '[]'$ ED /'[]'$ Urgent Care (no appt availability in office) / '[x]'$ Appointment(In office/virtual)/ '[]'$  [place] [facility]/ '[]'$ Home Care/ '[]'$ Refused Recommended Disposition /'[]'$ [place] [facility]/ '[]'$  Follow-up with PCP Additional Notes: Pt had a question regarding Vit D instructions. Shared providre's note for 04/21/2022.    Pt also has intermittent right arm and leg tingling. She moves her head around and uses her left arm to reposition the right arm until it resolves. PT thinks it may be a "pinched nerve." PT has been seen for this previously without resolution.   Summary: Discuss medication/ rt side numbness   Caller needing clarification on directions for vitamin D rx   Caller states pt is " more numb on rt side" than before and inquiring if provider wants to see pt prior to next appt       Per note fron OV of 04/21/2022:  I will send in a prescription strength supplement, 50,000 IUs once a week for 12 weeks, then switch over to over-the-counter vitamin D at least at 1000 IUs daily.  Reason for Disposition  [1] Weakness of arm / hand, or leg / foot AND [2] is a chronic symptom (recurrent or ongoing AND present > 4 weeks)  Answer Assessment - Initial Assessment Questions 1. SYMPTOM: "What is the main symptom you are concerned about?" (e.g., weakness, numbness)     Right leg and arm numbness 2. ONSET: "When did this start?" (minutes, hours, days; while sleeping)     Ongoing 3. LAST NORMAL: "When was the last time you (the patient) were normal (no symptoms)?"      4. PATTERN "Does this come and go, or has it been constant since it started?"  "Is it present now?"     Intermittent 5. CARDIAC SYMPTOMS: "Have you had any of the following  symptoms: chest pain, difficulty breathing, palpitations?"     no 6. NEUROLOGIC SYMPTOMS: "Have you had any of the following symptoms: headache, dizziness, vision loss, double vision, changes in speech, unsteady on your feet?"     no 7. OTHER SYMPTOMS: "Do you have any other symptoms?"     no 8. PREGNANCY: "Is there any chance you are pregnant?" "When was your last menstrual period?"     na  Protocols used: Neurologic Deficit-A-AH

## 2022-07-01 ENCOUNTER — Encounter: Payer: Self-pay | Admitting: Internal Medicine

## 2022-07-01 ENCOUNTER — Ambulatory Visit (INDEPENDENT_AMBULATORY_CARE_PROVIDER_SITE_OTHER): Payer: Medicare Other | Admitting: Internal Medicine

## 2022-07-01 VITALS — BP 116/72 | HR 88 | Temp 98.1°F | Resp 16 | Ht 62.0 in | Wt 144.8 lb

## 2022-07-01 DIAGNOSIS — R200 Anesthesia of skin: Secondary | ICD-10-CM

## 2022-07-01 DIAGNOSIS — R531 Weakness: Secondary | ICD-10-CM

## 2022-07-01 NOTE — Patient Instructions (Addendum)
It was great seeing you today!  Plan discussed at today's visit: -Continue Vitamin B1 vitamins, switch to Vitamin D over the counter 2000 IU daily -Referral to Neurology placed   Follow up in: next month, already scheduled  Take care and let us know if you have any questions or concerns prior to your next visit.  Dr. Rosana Berger

## 2022-07-01 NOTE — Progress Notes (Signed)
Acute Office Visit  Subjective:     Patient ID: Rebecca Faulkner, female    DOB: 04-26-1941, 81 y.o.   MRN: [id number]  Chief Complaint  Patient presents with   Numbness    Right side onset months had CT head back in June for stroke like symptoms    HPI Patient is in today for numbness in right upper and lower extremity.  This for started a few months ago, CT of the head was obtained on 04/21/2022, which was negative for acute abnormalities.  Laboratory work-up significant only for low vitamin B1 levels, she is currently on a B complex supplement.  Her vitamin D levels were also low, she almost completed her prescription strength supplements and will switch to over-the-counter vitamin D 2000 IUs daily.  She cannot undergo an MRI due to metal plates in her left humerus and right femur.  NUMBNESS Duration: months Onset: gradual Location: Right hand, fingers and right foot Bilateral: no Symmetric: no Decreased sensation: yes  Weakness: yes Pain: no Quality:   Feels like her limbs are falling asleep Severity: severe  Frequency: intermittent Trauma: no Recent illness: no Diabetes: no Thyroid disease: no  HIV: no    Review of Systems  Constitutional:  Negative for chills and fever.  Eyes:  Negative for blurred vision.  Respiratory:  Negative for shortness of breath.   Cardiovascular:  Negative for chest pain.  Neurological:  Positive for tingling, sensory change and weakness.        Objective:    BP 116/72   Pulse 88   Temp 98.1 F (36.7 C)   Resp 16   Ht '5\' 2"'$  (1.575 m)   Wt 144 lb 12.8 oz (65.7 kg)   SpO2 96%   BMI 26.48 kg/m  BP Readings from Last 3 Encounters:  07/01/22 116/72  05/05/22 126/80  04/21/22 122/78   Wt Readings from Last 3 Encounters:  07/01/22 144 lb 12.8 oz (65.7 kg)  05/05/22 147 lb 11.2 oz (67 kg)  04/21/22 147 lb 4.8 oz (66.8 kg)      Physical Exam Constitutional:      Appearance: Normal appearance.  HENT:     Head:  Normocephalic and atraumatic.     Mouth/Throat:     Mouth: Mucous membranes are moist.     Pharynx: Oropharynx is clear.  Eyes:     Extraocular Movements: Extraocular movements intact.     Conjunctiva/sclera: Conjunctivae normal.     Pupils: Pupils are equal, round, and reactive to light.  Cardiovascular:     Rate and Rhythm: Normal rate and regular rhythm.  Pulmonary:     Effort: Pulmonary effort is normal.     Breath sounds: Normal breath sounds.  Skin:    General: Skin is warm and dry.  Neurological:     General: No focal deficit present.     Mental Status: She is alert. Mental status is at baseline.     Cranial Nerves: No cranial nerve deficit.     Sensory: Sensory deficit present.     Motor: Weakness present.     Gait: Gait abnormal.     Comments: Bilateral upper extremity weakness with grip strength as well as decreased sensation in the right upper extremity to light touch.  Patient walks with a shuffle-like gait, uses a cane with ambulation.  Psychiatric:        Mood and Affect: Mood normal.        Behavior: Behavior normal.  No results found for any visits on 07/01/22.      Assessment & Plan:   1. Right sided weakness/Right sided numbness: CT of the head from June without acute abnormalities.  Patient is on a B vitamin complex and vitamin D supplements.  She is still complaining of right upper and lower extremity weakness and numbness with sensation change.  At this point she will be referred to neurology for further work-up.  Follow-up here in 1 month.  - Ambulatory referral to Neurology   Return for already scheduled .  Teodora Medici, DO

## 2022-07-28 DIAGNOSIS — R42 Dizziness and giddiness: Secondary | ICD-10-CM | POA: Diagnosis not present

## 2022-07-28 DIAGNOSIS — R202 Paresthesia of skin: Secondary | ICD-10-CM | POA: Diagnosis not present

## 2022-07-28 DIAGNOSIS — R531 Weakness: Secondary | ICD-10-CM | POA: Diagnosis not present

## 2022-07-28 DIAGNOSIS — R200 Anesthesia of skin: Secondary | ICD-10-CM | POA: Diagnosis not present

## 2022-08-11 ENCOUNTER — Ambulatory Visit (INDEPENDENT_AMBULATORY_CARE_PROVIDER_SITE_OTHER): Payer: Medicare Other | Admitting: Internal Medicine

## 2022-08-11 ENCOUNTER — Encounter: Payer: Self-pay | Admitting: Internal Medicine

## 2022-08-11 VITALS — BP 112/64 | HR 88 | Resp 16 | Ht 62.0 in | Wt 143.5 lb

## 2022-08-11 DIAGNOSIS — R200 Anesthesia of skin: Secondary | ICD-10-CM

## 2022-08-11 DIAGNOSIS — E782 Mixed hyperlipidemia: Secondary | ICD-10-CM | POA: Diagnosis not present

## 2022-08-11 DIAGNOSIS — Z23 Encounter for immunization: Secondary | ICD-10-CM

## 2022-08-11 DIAGNOSIS — I700 Atherosclerosis of aorta: Secondary | ICD-10-CM

## 2022-08-11 MED ORDER — SIMVASTATIN 20 MG PO TABS: 20.0000 mg | ORAL_TABLET | Freq: Every day | ORAL | 1 refills | Status: DC

## 2022-08-11 NOTE — Progress Notes (Signed)
Established Patient Office Visit  Subjective:     Patient ID: Rebecca Faulkner, female    DOB: June 18, 1941, 81 y.o.   MRN: [id number]  Chief Complaint  Patient presents with   Follow-up    HPI Patient is in today for follow up on numbness in right upper and lower extremity.  This for started a few months ago, CT of the head was obtained on 04/21/2022, which was negative for acute abnormalities.  Laboratory work-up significant only for low vitamin B1 levels, she is currently on a B complex supplement.  Her vitamin D levels were also low, she has completed her prescription strength supplements and is now on over-the-counter vitamin D 2000 IUs daily.  She cannot undergo an MRI due to metal plates in her left humerus and right femur.  She was seen by neurology at [facility] on 07/28/2022.  She is scheduled for an EMG of her right upper extremity and she was started on gabapentin 100-200 mg nightly.  She was also started on baby aspirin. Today she states that the gabapentin 100 mg nightly does help with some symptoms mildly but it does make her tired, even if she is taking it at night. EMG scheduled for 11/27.   HLD: -Medications: Started on Zocor 20 mg at last office visit -Patient is compliant with above medications and reports no side effects. Doing well.  -Last lipid panel: Lipid Panel     Component Value Date/Time   CHOL 221 (H) 04/21/2022 1454   TRIG 79 04/21/2022 1454   HDL 65 04/21/2022 1454   CHOLHDL 3.4 04/21/2022 1454   LDLCALC 138 (H) 04/21/2022 1454   The 10-year ASCVD risk score (Arnett DK, et al., 2019) is: 19.5%   Values used to calculate the score:     Age: 60 years     Sex: Female     Is Non-Hispanic African American: No     Diabetic: No     Tobacco smoker: No     Systolic Blood Pressure: 272 mmHg     Is BP treated: No     HDL Cholesterol: 65 mg/dL     Total Cholesterol: 221 mg/dL  Health maintenance: -Blood work up-to-date -Colon cancer screening: Colonoscopy  2018   Review of Systems  Constitutional:  Negative for chills and fever.  Eyes:  Negative for blurred vision.  Respiratory:  Negative for shortness of breath.   Cardiovascular:  Negative for chest pain.  Neurological:  Positive for tingling, sensory change and weakness.        Objective:    BP 112/64   Pulse 88   Resp 16   Ht '5\' 2"'$  (1.575 m)   Wt 143 lb 8 oz (65.1 kg)   SpO2 96%   BMI 26.25 kg/m  BP Readings from Last 3 Encounters:  08/11/22 112/64  07/01/22 116/72  05/05/22 126/80   Wt Readings from Last 3 Encounters:  08/11/22 143 lb 8 oz (65.1 kg)  07/01/22 144 lb 12.8 oz (65.7 kg)  05/05/22 147 lb 11.2 oz (67 kg)      Physical Exam Constitutional:      Appearance: Normal appearance.  HENT:     Head: Normocephalic and atraumatic.  Eyes:     Conjunctiva/sclera: Conjunctivae normal.  Cardiovascular:     Rate and Rhythm: Normal rate and regular rhythm.  Pulmonary:     Effort: Pulmonary effort is normal.     Breath sounds: Normal breath sounds.  Skin:    General:  Skin is warm and dry.  Neurological:     General: No focal deficit present.     Mental Status: She is alert. Mental status is at baseline.  Psychiatric:        Mood and Affect: Mood normal.        Behavior: Behavior normal.     No results found for any visits on 08/11/22.      Assessment & Plan:   1. Mixed hyperlipidemiaAtherosclerosis of aorta [facility]): Doing well on Zocor 20 mg, she will continue this medication.  It was refilled today.  Plan to repeat fasting lipid panel at follow-up in 3 months.  - simvastatin (ZOCOR) 20 MG tablet; Take 1 tablet (20 mg total) by mouth at bedtime.  Dispense: 90 tablet; Refill: 1  2. Right sided numbness: Symptoms slightly improved while taking gabapentin 100 mg at night.  The patient is concerned taking higher dose will increase her fatigue.  She is scheduled for EMG on 11/27 and neurology follow-up on 12/11.  3. Need for influenza vaccination/Vaccine for  streptococcus pneumoniae and influenza: Flu and Prevnar 20 vaccines administered today.  - Flu Vaccine QUAD High Dose(Fluad) - Pneumococcal conjugate vaccine 20-valent (Prevnar 20)   Return in about 3 months (around 11/11/2022).  Teodora Medici, DO

## 2022-08-11 NOTE — Patient Instructions (Signed)
It was great seeing you today!  Plan discussed at today's visit: -Medications refilled -Please be fasting at least 8-12 hours prior to next appointment  Follow up in: 3 months   Take care and let us know if you have any questions or concerns prior to your next visit.  Dr. Rosana Berger

## 2022-09-22 DIAGNOSIS — R202 Paresthesia of skin: Secondary | ICD-10-CM | POA: Diagnosis not present

## 2022-09-22 DIAGNOSIS — R200 Anesthesia of skin: Secondary | ICD-10-CM | POA: Diagnosis not present

## 2022-10-06 DIAGNOSIS — R42 Dizziness and giddiness: Secondary | ICD-10-CM | POA: Diagnosis not present

## 2022-10-06 DIAGNOSIS — G629 Polyneuropathy, unspecified: Secondary | ICD-10-CM | POA: Diagnosis not present

## 2022-10-06 DIAGNOSIS — R202 Paresthesia of skin: Secondary | ICD-10-CM | POA: Diagnosis not present

## 2022-10-06 DIAGNOSIS — R531 Weakness: Secondary | ICD-10-CM | POA: Diagnosis not present

## 2022-10-13 DIAGNOSIS — H35413 Lattice degeneration of retina, bilateral: Secondary | ICD-10-CM | POA: Diagnosis not present

## 2022-10-20 ENCOUNTER — Other Ambulatory Visit: Payer: Self-pay

## 2022-10-20 ENCOUNTER — Emergency Department: Payer: Medicare Other

## 2022-10-20 ENCOUNTER — Encounter: Payer: Self-pay | Admitting: Emergency Medicine

## 2022-10-20 ENCOUNTER — Emergency Department
Admission: EM | Admit: 2022-10-20 | Discharge: 2022-10-20 | Disposition: A | Discharge status: Home / Self Care | Payer: Medicare Other | Attending: Emergency Medicine | Admitting: Emergency Medicine

## 2022-10-20 DIAGNOSIS — Z1152 Encounter for screening for COVID-19: Secondary | ICD-10-CM | POA: Diagnosis not present

## 2022-10-20 DIAGNOSIS — J0120 Acute ethmoidal sinusitis, unspecified: Secondary | ICD-10-CM | POA: Diagnosis not present

## 2022-10-20 DIAGNOSIS — R058 Other specified cough: Secondary | ICD-10-CM | POA: Insufficient documentation

## 2022-10-20 DIAGNOSIS — R519 Headache, unspecified: Secondary | ICD-10-CM | POA: Diagnosis not present

## 2022-10-20 DIAGNOSIS — R9431 Abnormal electrocardiogram [ECG] [EKG]: Secondary | ICD-10-CM | POA: Diagnosis not present

## 2022-10-20 DIAGNOSIS — J322 Chronic ethmoidal sinusitis: Secondary | ICD-10-CM

## 2022-10-20 DIAGNOSIS — R110 Nausea: Secondary | ICD-10-CM | POA: Diagnosis present

## 2022-10-20 DIAGNOSIS — J329 Chronic sinusitis, unspecified: Secondary | ICD-10-CM | POA: Diagnosis not present

## 2022-10-20 DIAGNOSIS — R059 Cough, unspecified: Secondary | ICD-10-CM | POA: Diagnosis not present

## 2022-10-20 LAB — COMPREHENSIVE METABOLIC PANEL
ALT: 17 U/L (ref 0–44)
AST: 25 U/L (ref 15–41)
Albumin: 4 g/dL (ref 3.5–5.0)
Alkaline Phosphatase: 55 U/L (ref 38–126)
Anion gap: 11 (ref 5–15)
BUN: 11 mg/dL (ref 8–23)
CO2: 21 mmol/L — ABNORMAL LOW (ref 22–32)
Calcium: 8.8 mg/dL — ABNORMAL LOW (ref 8.9–10.3)
Chloride: 107 mmol/L (ref 98–111)
Creatinine, Ser: 0.62 mg/dL (ref 0.44–1.00)
GFR, Estimated: >60 mL/min (ref >60)
Glucose, Bld: 110 mg/dL — ABNORMAL HIGH (ref 70–99)
Potassium: 3.5 mmol/L (ref 3.5–5.1)
Sodium: 139 mmol/L (ref 135–145)
Total Bilirubin: 0.8 mg/dL (ref 0.3–1.2)
Total Protein: 7 g/dL (ref 6.5–8.1)

## 2022-10-20 LAB — CBC WITH DIFFERENTIAL/PLATELET
Abs Immature Granulocytes: 0.05 10*3/uL (ref 0.00–0.07)
Basophils Absolute: 0 10*3/uL (ref 0.0–0.1)
Basophils Relative: 0 %
Eosinophils Absolute: 0 10*3/uL (ref 0.0–0.5)
Eosinophils Relative: 0 %
HCT: 42.8 % (ref 36.0–46.0)
Hemoglobin: 14.1 g/dL (ref 12.0–15.0)
Immature Granulocytes: 1 %
Lymphocytes Relative: 5 %
Lymphs Abs: 0.5 10*3/uL — ABNORMAL LOW (ref 0.7–4.0)
MCH: 30 pg (ref 26.0–34.0)
MCHC: 32.9 g/dL (ref 30.0–36.0)
MCV: 91.1 fL (ref 80.0–100.0)
Monocytes Absolute: 0.5 10*3/uL (ref 0.1–1.0)
Monocytes Relative: 5 %
Neutro Abs: 9.5 10*3/uL — ABNORMAL HIGH (ref 1.7–7.7)
Neutrophils Relative %: 89 %
Platelets: 214 10*3/uL (ref 150–400)
RBC: 4.7 MIL/uL (ref 3.87–5.11)
RDW: 13.2 % (ref 11.5–15.5)
WBC: 10.5 10*3/uL (ref 4.0–10.5)
nRBC: 0 % (ref 0.0–0.2)

## 2022-10-20 LAB — RESP PANEL BY RT-PCR (RSV, FLU A&B, COVID)  RVPGX2
Influenza A by PCR: NEGATIVE
Influenza B by PCR: NEGATIVE
Resp Syncytial Virus by PCR: NEGATIVE
SARS Coronavirus 2 by RT PCR: NEGATIVE

## 2022-10-20 LAB — TROPONIN I (HIGH SENSITIVITY): Troponin I (High Sensitivity): 6 ng/L (ref <18)

## 2022-10-20 LAB — LIPASE, BLOOD: Lipase: 35 U/L (ref 11–51)

## 2022-10-20 MED ORDER — AMOXICILLIN-POT CLAVULANATE 875-125 MG PO TABS
1.0000 | ORAL_TABLET | Freq: Once | ORAL | Status: AC
  Administered 2022-10-20: 1 via ORAL
  Filled 2022-10-20: qty 1

## 2022-10-20 MED ORDER — SODIUM CHLORIDE 0.9 % IV BOLUS
500.0000 mL | Freq: Once | INTRAVENOUS | Status: AC
  Administered 2022-10-20: 500 mL via INTRAVENOUS

## 2022-10-20 MED ORDER — ACETAMINOPHEN 500 MG PO TABS
1000.0000 mg | ORAL_TABLET | Freq: Once | ORAL | Status: AC
  Administered 2022-10-20: 1000 mg via ORAL
  Filled 2022-10-20: qty 2

## 2022-10-20 MED ORDER — ONDANSETRON 4 MG PO TBDP: 4.0000 mg | ORAL_TABLET | Freq: Three times a day (TID) | ORAL | 0 refills | Status: AC | PRN

## 2022-10-20 MED ORDER — AMOXICILLIN-POT CLAVULANATE 875-125 MG PO TABS: 1.0000 | ORAL_TABLET | Freq: Two times a day (BID) | ORAL | 0 refills | Status: AC

## 2022-10-20 MED ORDER — ONDANSETRON HCL 4 MG/2ML IJ SOLN
4.0000 mg | Freq: Once | INTRAMUSCULAR | Status: AC
  Administered 2022-10-20: 4 mg via INTRAVENOUS
  Filled 2022-10-20: qty 2

## 2022-10-20 NOTE — ED Notes (Signed)
See triage note  Presents with nausea h/a and some body aches  States sxs' started yesterday  Afebrile on arrival

## 2022-10-20 NOTE — Discharge Instructions (Addendum)
We are covering you for possible sinus infection given your CT scan and your symptoms however you develop worsening shortness of breath you should return to the ER immediately for repeat evaluation.  You can take Tylenol 1 g every 8 hours to help with your pain and drink plenty of fluids and stay well-hydrated.  I prescribed a few nausea pills in case you need them to help stay well-hydrated.  We have discussed CT imaging of your abdomen but at that time you did not have any abdominal pain but if you develop pain in your abdomen please return to the ER for repeat evaluation

## 2022-10-20 NOTE — ED Triage Notes (Signed)
Pt states that yesterday she started to not feel well with headache, body aches and nausea. Pt states chills. Pt works in [employer] and been around a lot of people who have been sick.

## 2022-10-20 NOTE — ED Provider Triage Note (Signed)
Emergency Medicine Provider Triage Evaluation Note  Rebecca Faulkner , a 81 y.o. female  was evaluated in triage.  Pt complains of headache, body aches, nausea, chills since yesterday. She works in [employer] and has likely been exposed to viral illnesses.    Physical Exam  BP (!) 145/86   Pulse (!) 105   Temp 98.3 F (36.8 C)   Resp 20   Ht '5\' 2"'$  (1.575 m)   Wt 63.5 kg   SpO2 95%   BMI 25.61 kg/m  Gen:   Awake, no distress   Resp:  Normal effort  MSK:   Moves extremities without difficulty  Other:    Medical Decision Making  Medically screening exam initiated at 1:13 PM.  Appropriate orders placed.  [name] was informed that the remainder of the evaluation will be completed by another provider, this initial triage assessment does not replace that evaluation, and the importance of remaining in the ED until their evaluation is complete   Victorino Dike, FNP 10/20/22 1314

## 2022-10-20 NOTE — ED Provider Notes (Signed)
[facility] Provider Note    Event Date/Time   First MD Initiated Contact with Patient 10/20/22 1506     (approximate)   History   Nausea   HPI  Rebecca Faulkner is a 81 y.o. female with HLD who comes in with nausea, headache, and bodyaches. No fevers. Symptoms started yesterday. Denies any fever reducers today.  Patient reports that her symptoms started while at work yesterday.  She reports some other people at work feeling sick as well.  She reports the headache is mild in nature and was not sudden or severe in onset.  She does report getting headaches that been more severe in the past this is more just like a dull ache.  She does report a cough associated with it.  Has not had a measurable temperature just feels hot off and on.  She denies any falls hitting her head denies any chest pain shortness of breath any abdominal discomfort.  She reports that she rarely gets sick and she just want to make sure that everything looked okay.    Physical Exam   Triage Vital Signs: ED Triage Vitals  Enc Vitals Group     BP 10/20/22 1308 (!) 145/86     Pulse Rate 10/20/22 1308 (!) 105     Resp 10/20/22 1308 20     Temp 10/20/22 1308 98.3 F (36.8 C)     Temp src --      SpO2 10/20/22 1308 95 %     Weight 10/20/22 1309 140 lb (63.5 kg)     Height 10/20/22 1309 '5\' 2"'$  (1.575 m)     Head Circumference --      Peak Flow --      Pain Score 10/20/22 1309 5     Pain Loc --      Pain Edu? --      Excl. in [place]? --     Most recent vital signs: Vitals:   10/20/22 1308  BP: (!) 145/86  Pulse: (!) 105  Resp: 20  Temp: 98.3 F (36.8 C)  SpO2: 95%     General: Awake, no distress.  CV:  Good peripheral perfusion.  Resp:  Normal effort.  Clear lungs Abd:  No distention.  Soft and nontender Other:  No swelling in legs.  No calf tenderness Full range of motion of neck.  No neck stiffness  ED Results / Procedures / Treatments   Labs (all labs ordered are listed,  but only abnormal results are displayed) Labs Reviewed  RESP PANEL BY RT-PCR (RSV, FLU A&B, COVID)  [id number]  CBC WITH DIFFERENTIAL/PLATELET  COMPREHENSIVE METABOLIC PANEL  LIPASE, BLOOD  URINALYSIS, ROUTINE W REFLEX MICROSCOPIC  TROPONIN I (HIGH SENSITIVITY)     EKG  My interpretation of EKG:  Normal sinus rhythm 94 without any ST elevation or T wave inversions, normal intervals  RADIOLOGY I have reviewed the xray personally and interpreted and no pneumonia  PROCEDURES:  Critical Care performed: No  Procedures   MEDICATIONS ORDERED IN ED: Medications  sodium chloride 0.9 % bolus 500 mL (has no administration in time range)  acetaminophen (TYLENOL) tablet 1,000 mg (has no administration in time range)  ondansetron (ZOFRAN) injection 4 mg (has no administration in time range)     IMPRESSION / MDM / ASSESSMENT AND PLAN / ED COURSE  I reviewed the triage vital signs and the nursing notes.   Patient's presentation is most consistent with acute presentation with potential threat to life  or bodily function.   Patient comes in with cough, body aches, headaches, nausea decreased p.o. intake.  Patient slightly tachycardic will get IV to give some IV fluids and some Tylenol to help with symptoms.  She is very well-appearing at this time.  No evidence of sepsis or meningitis.  Will get CT head just to ensure no signs of intercranial mass.  Chest x-ray to evaluate for any pneumonia.  We discussed CT imaging of her abdomen given previously she had diverticulitis on review of records but patient is adamant she is not having any abdominal pain.  She reports no diarrhea or symptoms of diverticulitis.  She states that she like to try to leave by 538 so at this time she declines CT abdomen pelvis given we have to wait for the labs to come back to get this.  No significant shortness of breath or swelling her legs to suggest PE or DVT.   COVID, flu are negative  CT head does show some ethmoid  sinus disease.  Chest x-ray without any evidence of any pneumonia  Troponin negative.  CBC reassuring CMP reassuring lipase normal.  After fluids and Tylenol patient reports complete resolution of headache.  Patient is ambulatory sat is normal.  I suspect patient could have some developing sinusitis given age we discussed pros and cons of antibiotics and she would like to proceed.  Will give first dose here and discharge patient.  She feels comfortable with this plan.  Recommended UA but patient did not want to stay for urine.  She denies any urinary symptoms.  She reports that she has to get home before gets dark out otherwise she cannot drive and therefore give declines UA at this time.  Repeat abdominal exam remains soft and nontender and she feels comfortable with discharge home    FINAL CLINICAL IMPRESSION(S) / ED DIAGNOSES   Final diagnoses:  Ethmoid sinusitis, unspecified chronicity     Rx / DC Orders   ED Discharge Orders          Ordered    amoxicillin-clavulanate (AUGMENTIN) 875-125 MG tablet  2 times daily        10/20/22 1811             Note:  This document was prepared using Dragon voice recognition software and may include unintentional dictation errors.   Vanessa [place], MD 10/20/22 [phone]

## 2022-10-22 NOTE — Progress Notes (Signed)
Patient ID: Rebecca Faulkner, female    DOB: Jun 01, 1941, 81 y.o.   MRN: [id number]  PCP: Teodora Medici, DO  Chief Complaint  Patient presents with   Sinusitis    Seen at ED on 12/25 negative for pneum, flu, covid and rsv.  CT showed sinusitis and was given antibiotic for 10 days currently on day 4. Still having bad cough    Subjective:   Rebecca Faulkner is a 81 y.o. female, presents to clinic with CC of the following:  HPI  Pt presents for HA/N, and ED f/up  Was seen on 12/25 dx with sinusitis per CT scan, other workup unremarkable, started on augmentin for bacterial sinusitis Today she reports some ficial pain and pressure getting a little better She is coughing worse - fits of coughing Sweats, no fever, no wheeze or CP  Patient Active Problem List   Diagnosis Date Noted   History of diverticulitis 03/12/2020   Left ovarian cyst 03/12/2020   Atherosclerosis of aorta ([place]) 03/12/2020   Osteoporosis 08/05/2019   Closed comminuted intertrochanteric fracture of proximal femur ([place]) 07/27/2018   Mass of upper outer quadrant of left breast 06/01/2018   Goals of care, counseling/discussion 01/31/2018   Osteoporosis without current pathological fracture 07/28/2017   Depression, major, recurrent, mild ([place]) 07/07/2017   B12 deficiency 07/07/2017   Vitamin D deficiency 07/07/2017   Screening for colon cancer 05/27/2017   Malignant neoplasm of upper-outer quadrant of right breast in female, estrogen receptor positive ([place]) 05/27/2017   Glaucoma 05/26/2017   History of alcoholism ([place]) 05/26/2017   History of hip fracture 05/10/2015   Breast cancer, right ([place]) 05/27/2012   Estrogen receptor positive status (ER+) 05/14/2012      Current Outpatient Medications:    amoxicillin-clavulanate (AUGMENTIN) 875-125 MG tablet, Take 1 tablet by mouth 2 (two) times daily for 10 days., Disp: 20 tablet, Rfl: 0   cholecalciferol (VITAMIN D3) 25 MCG (1000 UNIT) tablet, Take 1,000 Units by  mouth daily., Disp: , Rfl:    gabapentin (NEURONTIN) 100 MG capsule, Take 100 mg by mouth 2 (two) times daily., Disp: , Rfl:    Multiple Vitamins-Minerals (MULTIVITAMIN WITH MINERALS) tablet, Take 1 tablet by mouth daily., Disp: , Rfl:    ondansetron (ZOFRAN-ODT) 4 MG disintegrating tablet, Take 1 tablet (4 mg total) by mouth every 8 (eight) hours as needed for up to 5 days., Disp: 15 tablet, Rfl: 0   simvastatin (ZOCOR) 20 MG tablet, Take 1 tablet (20 mg total) by mouth at bedtime., Disp: 90 tablet, Rfl: 1   thiamine (VITAMIN B-1) 100 MG tablet, Take 100 mg by mouth daily., Disp: , Rfl:    Allergies  Allergen Reactions   Codeine Nausea Only   Pravastatin     Constipation      Social History   Tobacco Use   Smoking status: Never   Smokeless tobacco: Never  Vaping Use   Vaping Use: Never used  Substance Use Topics   Alcohol use: No    Comment: Quit drinking  11/04/2016   Drug use: No      Chart Review Today: I personally reviewed active problem list, medication list, allergies, family history, social history, health maintenance, notes from last encounter, lab results, imaging with the patient/caregiver today.   Review of Systems  Constitutional: Negative.   HENT: Negative.    Eyes: Negative.   Respiratory: Negative.    Cardiovascular: Negative.   Gastrointestinal: Negative.   Endocrine: Negative.   Genitourinary: Negative.  Musculoskeletal: Negative.   Skin: Negative.   Allergic/Immunologic: Negative.   Neurological: Negative.   Hematological: Negative.   Psychiatric/Behavioral: Negative.    All other systems reviewed and are negative.      Objective:   Vitals:   10/23/22 1552  BP: 122/78  Pulse: 96  Resp: 18  Temp: 98.7 F (37.1 C)  SpO2: 97%  Weight: 143 lb 3.2 oz (65 kg)  Height: '5\' 2"'$  (1.575 m)    Body mass index is 26.19 kg/m.  Physical Exam Vitals and nursing note reviewed.  Constitutional:      General: She is not in acute distress.     Appearance: Normal appearance. She is well-developed, well-groomed and normal weight. She is not ill-appearing, toxic-appearing or diaphoretic.     Comments: Well-appearing, nontoxic  HENT:     Head: Normocephalic and atraumatic.     Right Ear: Tympanic membrane, ear canal and external ear normal. There is no impacted cerumen.     Left Ear: Tympanic membrane, ear canal and external ear normal. There is no impacted cerumen.     Nose: Congestion and rhinorrhea present.     Mouth/Throat:     Mouth: Mucous membranes are moist.     Pharynx: Oropharynx is clear. No oropharyngeal exudate or posterior oropharyngeal erythema.  Eyes:     General: No scleral icterus.       Right eye: No discharge.        Left eye: No discharge.     Conjunctiva/sclera: Conjunctivae normal.  Cardiovascular:     Rate and Rhythm: Normal rate and regular rhythm.     Pulses: Normal pulses.     Heart sounds: Normal heart sounds. No murmur heard.    No friction rub. No gallop.  Pulmonary:     Effort: Pulmonary effort is normal. No respiratory distress.     Breath sounds: No stridor. Rhonchi present. No wheezing or rales.  Chest:     Chest wall: No tenderness.  Abdominal:     General: Bowel sounds are normal. There is no distension.     Palpations: Abdomen is soft.     Tenderness: There is no abdominal tenderness. There is no guarding.  Musculoskeletal:     Cervical back: Normal range of motion.  Skin:    General: Skin is warm.     Coloration: Skin is not jaundiced or pale.     Findings: No lesion or rash.  Neurological:     General: No focal deficit present.     Mental Status: She is alert.     Cranial Nerves: No cranial nerve deficit.     Sensory: No sensory deficit.     Motor: No weakness.     Coordination: Coordination normal.     Gait: Gait normal.  Psychiatric:        Mood and Affect: Mood normal.        Behavior: Behavior is cooperative.      Results for orders placed or performed during the  hospital encounter of 10/20/22  Resp panel by RT-PCR (RSV, Flu A&B, Covid) Anterior Nasal Swab   Specimen: Anterior Nasal Swab  Result Value Ref Range   SARS Coronavirus 2 by RT PCR NEGATIVE NEGATIVE   Influenza A by PCR NEGATIVE NEGATIVE   Influenza B by PCR NEGATIVE NEGATIVE   Resp Syncytial Virus by PCR NEGATIVE NEGATIVE  CBC with Differential  Result Value Ref Range   WBC 10.5 4.0 - 10.5 K/uL   RBC 4.70 3.87 -  5.11 MIL/uL   Hemoglobin 14.1 12.0 - 15.0 g/dL   HCT 42.8 36.0 - 46.0 %   MCV 91.1 80.0 - 100.0 fL   MCH 30.0 26.0 - 34.0 pg   MCHC 32.9 30.0 - 36.0 g/dL   RDW 13.2 11.5 - 15.5 %   Platelets 214 150 - 400 K/uL   nRBC 0.0 0.0 - 0.2 %   Neutrophils Relative % 89 %   Neutro Abs 9.5 (H) 1.7 - 7.7 K/uL   Lymphocytes Relative 5 %   Lymphs Abs 0.5 (L) 0.7 - 4.0 K/uL   Monocytes Relative 5 %   Monocytes Absolute 0.5 0.1 - 1.0 K/uL   Eosinophils Relative 0 %   Eosinophils Absolute 0.0 0.0 - 0.5 K/uL   Basophils Relative 0 %   Basophils Absolute 0.0 0.0 - 0.1 K/uL   Immature Granulocytes 1 %   Abs Immature Granulocytes 0.05 0.00 - 0.07 K/uL  Comprehensive metabolic panel  Result Value Ref Range   Sodium 139 135 - 145 mmol/L   Potassium 3.5 3.5 - 5.1 mmol/L   Chloride 107 98 - 111 mmol/L   CO2 21 (L) 22 - 32 mmol/L   Glucose, Bld 110 (H) 70 - 99 mg/dL   BUN 11 8 - 23 mg/dL   Creatinine, Ser 0.62 0.44 - 1.00 mg/dL   Calcium 8.8 (L) 8.9 - 10.3 mg/dL   Total Protein 7.0 6.5 - 8.1 g/dL   Albumin 4.0 3.5 - 5.0 g/dL   AST 25 15 - 41 U/L   ALT 17 0 - 44 U/L   Alkaline Phosphatase 55 38 - 126 U/L   Total Bilirubin 0.8 0.3 - 1.2 mg/dL   GFR, Estimated >60 >60 mL/min   Anion gap 11 5 - 15  Lipase, blood  Result Value Ref Range   Lipase 35 11 - 51 U/L  Troponin I (High Sensitivity)  Result Value Ref Range   Troponin I (High Sensitivity) 6 <18 ng/L       Assessment & Plan:     ICD-10-CM   1. Ethmoid sinusitis, unspecified chronicity  J32.2    Reviewed her prior CT  scan with her and her neighbor, still feeling fairly under the weather encouraged her to rest and do meds for another 2 days before RTW    2. Nonintractable headache, unspecified chronicity pattern, unspecified headache type  R51.9    Headache has improved some on exam she has no sinus tenderness to palpation and no concerning neurodeficits    3. Nausea  R11.0    Offered antinausea medications she is still progressing diet urged her to take her antibiotic and is on with food can try Pepcid did as well to set stomach    4. Bronchitis  J40 benzonatate (TESSALON) 100 MG capsule    predniSONE (DELTASONE) 20 MG tablet    guaiFENesin (MUCINEX) 600 MG 12 hr tablet    promethazine-dextromethorphan (PROMETHAZINE-DM) 6.25-15 MG/5ML syrup   Sinusitis has slightly improved after only a few days of an biotics she has were running reading symptoms with some tightness wheeze - tx for mild onset of bronchitis -reviewed multiple cough medication she can try prescribed benzonatate, a burst of steroids encouraged her to use Mucinex, over-the-counter nasal sprays like saline and Flonase and use a antihistamine like Claritin Zyrtec or Allegra to use all these medications over the next 1 to 2 weeks She can also try the promethazine cough syrup because of her severe complaints of coughing but I did  warn her to do the lowest dose and it can make her sleepy I encouraged her to try Mucinex and Tessalon first and see how she does and then she could try Promethazine DM    5. Encounter for examination following treatment at hospital  Z09    ER visit labs, results and treatment recommendations all reviewed and patient is here today for follow-up     Additional info/instructions for pt on AVS: Recommend stopping the antibiotics at 7 d if you are feeling better You are okay to hold you vitamins and statin for a few days while sick and having an upset stomach  Encouraged to rest, push fluids, do not return to work for  another ~2 d or I'm afraid she will prolong her recovery All questions asked and answered with pt and with her friend present  F/up if not improving over the next 1-2 weeks     Delsa Grana, PA-C 10/22/22 8:36 PM

## 2022-10-23 ENCOUNTER — Encounter: Payer: Self-pay | Admitting: Family Medicine

## 2022-10-23 ENCOUNTER — Ambulatory Visit (INDEPENDENT_AMBULATORY_CARE_PROVIDER_SITE_OTHER): Payer: Medicare Other | Admitting: Family Medicine

## 2022-10-23 VITALS — BP 122/78 | HR 96 | Temp 98.7°F | Resp 18 | Ht 62.0 in | Wt 143.2 lb

## 2022-10-23 DIAGNOSIS — R110 Nausea: Secondary | ICD-10-CM | POA: Diagnosis not present

## 2022-10-23 DIAGNOSIS — J40 Bronchitis, not specified as acute or chronic: Secondary | ICD-10-CM

## 2022-10-23 DIAGNOSIS — R519 Headache, unspecified: Secondary | ICD-10-CM | POA: Diagnosis not present

## 2022-10-23 DIAGNOSIS — J322 Chronic ethmoidal sinusitis: Secondary | ICD-10-CM

## 2022-10-23 DIAGNOSIS — Z09 Encounter for follow-up examination after completed treatment for conditions other than malignant neoplasm: Secondary | ICD-10-CM

## 2022-10-23 MED ORDER — BENZONATATE 100 MG PO CAPS: 100.0000 mg | ORAL_CAPSULE | Freq: Three times a day (TID) | ORAL | 1 refills | Status: DC | PRN

## 2022-10-23 MED ORDER — PROMETHAZINE-DM 6.25-15 MG/5ML PO SYRP: 2.5000 mL | ORAL_SOLUTION | Freq: Two times a day (BID) | ORAL | 0 refills | Status: DC | PRN

## 2022-10-23 MED ORDER — PREDNISONE 20 MG PO TABS: ORAL_TABLET | ORAL | 0 refills | Status: DC

## 2022-10-23 MED ORDER — GUAIFENESIN ER 600 MG PO TB12: 600.0000 mg | ORAL_TABLET | Freq: Two times a day (BID) | ORAL | 0 refills | Status: DC | PRN

## 2022-10-23 NOTE — Patient Instructions (Signed)
Recommend stopping the antibiotics at 7 d if you are feeling better You are okay to hold you vitamins and statin for a few days while sick and having an upset stomach

## 2022-10-29 ENCOUNTER — Telehealth: Payer: Self-pay | Admitting: *Deleted

## 2022-10-29 NOTE — Telephone Encounter (Signed)
     Patient  visit on 10/20/2022  at [facility] was for SINUS  Have you been able to follow up with your primary care physician? FOLLOWED UP WITH PCP  The patient was or was not able to obtain any needed medicine or equipment.  Are there diet recommendations that you are having difficulty following?  Patient expresses understanding of discharge instructions and education provided has no other needs at this time.    [place] [phone] [address] , [place] [address] Email : [email or web address]

## 2022-11-05 ENCOUNTER — Encounter: Payer: Self-pay | Admitting: Hematology and Oncology

## 2022-11-05 ENCOUNTER — Telehealth: Payer: Self-pay | Admitting: Internal Medicine

## 2022-11-05 NOTE — Telephone Encounter (Signed)
Copied from [place] [phone] [phone]. Topic: General - Other >> Nov 05, 2022  2:42 PM Everette C wrote: Reason for CRM: The patient's friend Gladys Damme would like to be contacted by Dr. Rosana Berger or a member of clinical staff when possible regarding the patient's upcoming appointment   Please contact further possible

## 2022-11-05 NOTE — Telephone Encounter (Signed)
Patients friend is very concerned about toenails, states they went to neurologist for the numbness and she was embarrassed to take off socks and nails were think and long to the point they were curved under.  Neurologist never said or mentioned anything to her about it and friend did not say anything due to not wanting to upset/embarrass her.  They are coming in next Monday, friend would like you to take a look/refer her to podiatry.  Without her knowing we talked.

## 2022-11-09 NOTE — Progress Notes (Unsigned)
Established Patient Office Visit  Subjective:     Patient ID: Rebecca Faulkner, female    DOB: 14-Jan-1941, 82 y.o.   MRN: [id number]  No chief complaint on file.   HPI Patient is in today for follow up on numbness in right upper and lower extremity.  This started a few months ago, CT of the head was obtained on 04/21/2022, which was negative for acute abnormalities.  Laboratory work-up significant only for low vitamin B1 levels, she is currently on a B complex supplement.  Her vitamin D levels were also low, she has completed her prescription strength supplements and is now on over-the-counter vitamin D 2000 IUs daily.  She cannot undergo an MRI due to metal plates in her left humerus and right femur.  She was seen by neurology at [facility] on 07/28/2022 and again on 10/06/22.  She was started on gabapentin 100-200 mg nightly.  She was also started on baby aspirin. Today she states that the gabapentin 100 mg nightly does help with some symptoms mildly but it does make her tired, even if she is taking it at night. EMG scheduled for 11/27 showing chronic severe polyneuropathy.   HLD: -Medications: Started on Zocor 20 mg at last office visit -Patient is compliant with above medications and reports no side effects. Doing well.  -Last lipid panel: Lipid Panel     Component Value Date/Time   CHOL 221 (H) 04/21/2022 1454   TRIG 79 04/21/2022 1454   HDL 65 04/21/2022 1454   CHOLHDL 3.4 04/21/2022 1454   LDLCALC 138 (H) 04/21/2022 1454   The 10-year ASCVD risk score (Arnett DK, et al., 2019) is: 22.7%   Values used to calculate the score:     Age: 13 years     Sex: Female     Is Non-Hispanic African American: No     Diabetic: No     Tobacco smoker: No     Systolic Blood Pressure: 341 mmHg     Is BP treated: No     HDL Cholesterol: 65 mg/dL     Total Cholesterol: 221 mg/dL  Health maintenance: -Blood work up-to-date -Colon cancer screening: Colonoscopy 2018   Review of Systems   Constitutional:  Negative for chills and fever.  Eyes:  Negative for blurred vision.  Respiratory:  Negative for shortness of breath.   Cardiovascular:  Negative for chest pain.  Neurological:  Positive for tingling, sensory change and weakness.        Objective:    There were no vitals taken for this visit. BP Readings from Last 3 Encounters:  10/23/22 122/78  10/20/22 (!) 102/58  08/11/22 112/64   Wt Readings from Last 3 Encounters:  10/23/22 143 lb 3.2 oz (65 kg)  10/20/22 140 lb (63.5 kg)  08/11/22 143 lb 8 oz (65.1 kg)      Physical Exam Constitutional:      Appearance: Normal appearance.  HENT:     Head: Normocephalic and atraumatic.  Eyes:     Conjunctiva/sclera: Conjunctivae normal.  Cardiovascular:     Rate and Rhythm: Normal rate and regular rhythm.  Pulmonary:     Effort: Pulmonary effort is normal.     Breath sounds: Normal breath sounds.  Skin:    General: Skin is warm and dry.  Neurological:     General: No focal deficit present.     Mental Status: She is alert. Mental status is at baseline.  Psychiatric:  Mood and Affect: Mood normal.        Behavior: Behavior normal.     No results found for any visits on 11/10/22.      Assessment & Plan:   1. Mixed hyperlipidemiaAtherosclerosis of aorta [facility]): Doing well on Zocor 20 mg, she will continue this medication.  It was refilled today.  Plan to repeat fasting lipid panel at follow-up in 3 months.  - simvastatin (ZOCOR) 20 MG tablet; Take 1 tablet (20 mg total) by mouth at bedtime.  Dispense: 90 tablet; Refill: 1  2. Right sided numbness: Symptoms slightly improved while taking gabapentin 100 mg at night.  The patient is concerned taking higher dose will increase her fatigue.  She is scheduled for EMG on 11/27 and neurology follow-up on 12/11.  3. Need for influenza vaccination/Vaccine for streptococcus pneumoniae and influenza: Flu and Prevnar 20 vaccines administered today.  - Flu Vaccine  QUAD High Dose(Fluad) - Pneumococcal conjugate vaccine 20-valent (Prevnar 20)   No follow-ups on file.  Teodora Medici, DO

## 2022-11-10 ENCOUNTER — Ambulatory Visit (INDEPENDENT_AMBULATORY_CARE_PROVIDER_SITE_OTHER): Payer: Medicare Other | Admitting: Internal Medicine

## 2022-11-10 ENCOUNTER — Encounter: Payer: Self-pay | Admitting: Internal Medicine

## 2022-11-10 VITALS — BP 122/82 | HR 90 | Temp 97.6°F | Resp 18 | Ht 62.0 in | Wt 142.7 lb

## 2022-11-10 DIAGNOSIS — E519 Thiamine deficiency, unspecified: Secondary | ICD-10-CM | POA: Diagnosis not present

## 2022-11-10 DIAGNOSIS — E782 Mixed hyperlipidemia: Secondary | ICD-10-CM

## 2022-11-10 DIAGNOSIS — L602 Onychogryphosis: Secondary | ICD-10-CM | POA: Diagnosis not present

## 2022-11-10 DIAGNOSIS — R7309 Other abnormal glucose: Secondary | ICD-10-CM | POA: Diagnosis not present

## 2022-11-10 NOTE — Patient Instructions (Addendum)
It was great seeing you today!  Plan discussed at today's visit: -Blood work ordered today, results will be uploaded to [facility].  -Referral placed to Podiatry   Follow up in: 3 months   Take care and let us know if you have any questions or concerns prior to your next visit.  Dr. Rosana Berger

## 2022-11-11 ENCOUNTER — Encounter: Payer: Self-pay | Admitting: Hematology and Oncology

## 2022-11-12 MED ORDER — SIMVASTATIN 40 MG PO TABS: 40.0000 mg | ORAL_TABLET | Freq: Every day | ORAL | 1 refills | Status: DC

## 2022-11-12 NOTE — Addendum Note (Signed)
Addended by: Teodora Medici on: 11/12/2022 07:54 AM   Modules accepted: Orders

## 2022-11-14 LAB — LIPID PANEL
Cholesterol: 209 mg/dL — ABNORMAL HIGH (ref <200)
HDL: 64 mg/dL (ref >=50)
LDL Cholesterol (Calc): 125 mg/dL (calc) — ABNORMAL HIGH
Non-HDL Cholesterol (Calc): 145 mg/dL (calc) — ABNORMAL HIGH (ref <130)
Total CHOL/HDL Ratio: 3.3 (calc) (ref <5.0)
Triglycerides: 94 mg/dL (ref <150)

## 2022-11-14 LAB — VITAMIN B1: Vitamin B1 (Thiamine): 13 nmol/L (ref 8–30)

## 2022-11-14 LAB — HEMOGLOBIN A1C
Hgb A1c MFr Bld: 5.8 % of total Hgb — ABNORMAL HIGH (ref <5.7)
Mean Plasma Glucose: 120 mg/dL
eAG (mmol/L): 6.6 mmol/L

## 2022-11-17 DIAGNOSIS — H26491 Other secondary cataract, right eye: Secondary | ICD-10-CM | POA: Diagnosis not present

## 2022-11-18 ENCOUNTER — Telehealth: Payer: Self-pay | Admitting: Internal Medicine

## 2022-11-18 ENCOUNTER — Ambulatory Visit (INDEPENDENT_AMBULATORY_CARE_PROVIDER_SITE_OTHER): Payer: Medicare Other | Admitting: Physician Assistant

## 2022-11-18 ENCOUNTER — Encounter: Payer: Self-pay | Admitting: Physician Assistant

## 2022-11-18 VITALS — BP 116/72 | HR 97 | Temp 98.1°F | Resp 16 | Ht 62.0 in | Wt 147.0 lb

## 2022-11-18 DIAGNOSIS — Z0000 Encounter for general adult medical examination without abnormal findings: Secondary | ICD-10-CM | POA: Diagnosis not present

## 2022-11-18 DIAGNOSIS — M810 Age-related osteoporosis without current pathological fracture: Secondary | ICD-10-CM

## 2022-11-18 NOTE — Progress Notes (Signed)
Annual Wellness Visit    Subjective:   Rebecca Faulkner is a 82 y.o. female who presents for Medicare Annual (Subsequent) preventive examination.  Today's Provider: Talitha Givens, MHS, PA-C Introduced myself to the patient as a PA-C and provided education on APPs in clinical practice.   Review of Systems:   Cardiac Risk Factors include: advanced age (>42mn, >>61women);dyslipidemia     Objective:     Vitals: BP 116/72   Pulse 97   Temp 98.1 F (36.7 C) (Oral)   Resp 16   Ht '5\' 2"'$  (1.575 m)   Wt 147 lb (66.7 kg)   SpO2 97%   BMI 26.89 kg/m   Body mass index is 26.89 kg/m.     10/20/2022    1:10 PM 09/24/2020    1:14 PM 07/30/2020    9:13 AM 05/08/2020    8:16 AM 03/07/2020    8:45 AM 10/26/2019   11:34 AM 07/29/2019   11:19 AM  Advanced Directives  Does Patient Have a Medical Advance Directive? No No No No No No No  Would patient like information on creating a medical advance directive?  No - Patient declined No - Patient declined Yes (MAU/Ambulatory/Procedural Areas - Information given) No - Patient declined No - Patient declined No - Patient declined    Tobacco Social History   Tobacco Use  Smoking Status Never  Smokeless Tobacco Never     Counseling given: Not Answered   Clinical Intake:  Pre-visit preparation completed: Yes  Pain : No/denies pain     Nutritional Status: BMI 25 -29 Overweight Nutritional Risks: None Diabetes: No  How often do you need to have someone help you when you read instructions, pamphlets, or other written materials from your doctor or pharmacy?: 5 - Always What is the last grade level you completed in school?: 12th grade  Interpreter Needed?: No     Past Medical History:  Diagnosis Date   Breast cancer [place]   right breast   Heart murmur 1990   Hypercholesteremia    Lump or mass in breast    Malignant neoplasm of upper-outer quadrant of female breast [place] 2013   R-breast T1, NO, [place], ER/PR pos., Her 2  neg.   Osteoporosis    Ulcer 1974   Past Surgical History:  Procedure Laterality Date   BREAST SURGERY Right 2013   CATARACT EXTRACTION W/PHACO Right 04/23/2016   Procedure: CATARACT EXTRACTION PHACO AND INTRAOCULAR LENS PLACEMENT (IOC) RIGHT EYE;  Surgeon: CLeandrew Koyanagi MD;  [facility]  Service: Ophthalmology;  Laterality: Right;   CATARACT EXTRACTION W/PHACO Left 06/04/2016   Procedure: CATARACT EXTRACTION PHACO AND INTRAOCULAR LENS PLACEMENT (IOC);  Surgeon: CLeandrew Koyanagi MD;  [facility]  Service: Ophthalmology;  Laterality: Left;   CESAREAN SECTION  1969   COLONOSCOPY WITH PROPOFOL N/A 07/22/2017   Procedure: COLONOSCOPY WITH PROPOFOL;  Surgeon: BRobert Bellow MD;  Location: [facility] ENDOSCOPY;  Service: Endoscopy;  Laterality: N/A;   FRACTURE SURGERY Left 2011   arm, plate and 12 screws   LEG SURGERY Right 01/02/2015   MASTECTOMY Right 2013   Right inguinal hernia repair  2013   VEIN SURGERY Left 2015   Family History  Problem Relation Age of Onset   Dementia Mother    Stroke Father    Diabetes Brother    Breast cancer Neg Hx    Social History   Socioeconomic History   Marital status: Divorced  Spouse name: Not on file   Number of children: 1   Years of education: Not on file   Highest education level: Not on file  Occupational History   Not on file  Tobacco Use   Smoking status: Never   Smokeless tobacco: Never  Vaping Use   Vaping Use: Never used  Substance and Sexual Activity   Alcohol use: No    Comment: Quit drinking  11/04/2016   Drug use: No   Sexual activity: Not Currently  Other Topics Concern   Not on file  Social History Narrative   Works full time at [employer]. She is the Freight forwarder and to go window    She has one son  - that lives in [place]   Two dogs and one cat.    Social Determinants of Health   Financial Resource Strain: Low Risk  (05/08/2020)   Overall Financial Resource Strain (CARDIA)     Difficulty of Paying Living Expenses: Not hard at all  Food Insecurity: No Food Insecurity (05/08/2020)   Hunger Vital Sign    Worried About Running Out of Food in the Last Year: Never true    Ran Out of Food in the Last Year: Never true  Transportation Needs: No Transportation Needs (05/08/2020)   PRAPARE - Hydrologist (Medical): No    Lack of Transportation (Non-Medical): No  Physical Activity: Inactive (05/08/2020)   Exercise Vital Sign    Days of Exercise per Week: 0 days    Minutes of Exercise per Session: 0 min  Stress: No Stress Concern Present (05/08/2020)   [place]    Feeling of Stress : Not at all  Social Connections: Unknown (05/08/2020)   Social Connection and Isolation Panel [NHANES]    Frequency of Communication with Friends and Family: Patient refused    Frequency of Social Gatherings with Friends and Family: Patient refused    Attends Religious Services: Patient refused    Active Member of Clubs or Organizations: Patient refused    Attends Archivist Meetings: Patient refused    Marital Status: Divorced    Outpatient Encounter Medications as of 11/18/2022  Medication Sig   benzonatate (TESSALON) 100 MG capsule Take 1-2 capsules (100-200 mg total) by mouth 3 (three) times daily as needed for cough.   cholecalciferol (VITAMIN D3) 25 MCG (1000 UNIT) tablet Take 1,000 Units by mouth daily.   gabapentin (NEURONTIN) 100 MG capsule Take 100 mg by mouth 2 (two) times daily.   guaiFENesin (MUCINEX) 600 MG 12 hr tablet Take 1 tablet (600 mg total) by mouth 2 (two) times daily as needed.   Multiple Vitamins-Minerals (MULTIVITAMIN WITH MINERALS) tablet Take 1 tablet by mouth daily.   promethazine-dextromethorphan (PROMETHAZINE-DM) 6.25-15 MG/5ML syrup Take 2.5-5 mLs by mouth 2 (two) times daily as needed for cough.   simvastatin (ZOCOR) 40 MG tablet Take 1 tablet (40 mg  total) by mouth at bedtime.   thiamine (VITAMIN B-1) 100 MG tablet Take 100 mg by mouth daily.   No facility-administered encounter medications on file as of 11/18/2022.    Activities of Daily Living    11/18/2022    3:29 PM 11/18/2022    3:12 PM  In your present state of health, do you have any difficulty performing the following activities:  Hearing? 0 0  Vision? 0 0  Difficulty concentrating or making decisions? 1 0  Walking or climbing stairs? 0 0  Comment  She uses a cane intermittently   Dressing or bathing? 0 0  Doing errands, shopping? 0 1  Preparing Food and eating ? N   Using the Toilet? N   In the past six months, have you accidently leaked urine? Y   Do you have problems with loss of bowel control? N   Managing your Medications? Y   Managing your Finances? N   Housekeeping or managing your Housekeeping? N     Patient Care Team: Teodora Medici, DO as PCP - General (Internal Medicine) Steele Sizer, MD as Attending Physician (Family Medicine) Lequita Asal, MD (Inactive) as Referring Physician (Hematology and Oncology) Leandrew Koyanagi, MD as Referring Physician (Ophthalmology) Bary Castilla Forest Gleason, MD as Consulting Physician (General Surgery)    Assessment:   This is a routine wellness examination for [place].  Exercise Activities and Dietary recommendations Current Exercise Habits: The patient does not participate in regular exercise at present, Exercise limited by: orthopedic condition(s)   Goals Addressed   None     Fall Risk:    11/18/2022    3:12 PM 11/10/2022   10:02 AM 10/23/2022    3:53 PM 08/11/2022    1:23 PM 07/01/2022   11:40 AM  Fall Risk   Falls in the past year? 0 0 0 1 0  Number falls in past yr: 0 0 0 1 0  Injury with Fall? 0 0 0 0 0  Risk for fall due to : No Fall Risks   Impaired balance/gait;Impaired mobility   Follow up Falls prevention discussed;Education provided;Falls evaluation completed        FALL RISK PREVENTION  PERTAINING TO THE HOME:  Any stairs in or around the home? Yes  If so, are there any without handrails? Yes   Home free of loose throw rugs in walkways, pet beds, electrical cords, etc? Yes  Adequate lighting in your home to reduce risk of falls? Yes   ASSISTIVE DEVICES UTILIZED TO PREVENT FALLS:  Life alert? No  Use of a cane, walker or w/c? Yes  Grab bars in the bathroom? Yes  Shower chair or bench in shower? Yes  Elevated toilet seat or a handicapped toilet? Yes   DME ORDERS:  DME order needed?  No   TIMED UP AND GO:  Was the test performed? Yes .  Length of time to ambulate 10 feet: 8 sec.   GAIT:  Appearance of gait: Gait steady and fast without the use of an assistive device.  Education: Fall risk prevention has been discussed.  Intervention(s) required? No   DME/home health order needed?  No    Depression Screen    11/18/2022    3:12 PM 11/10/2022   10:02 AM 10/23/2022    3:53 PM 08/11/2022    1:27 PM  PHQ 2/9 Scores  PHQ - 2 Score 0 0 0 0  PHQ- 9 Score 0 0 0 0     Cognitive Function    05/05/2022    1:46 PM  MMSE - Mini Mental State Exam  Orientation to time 4  Orientation to Place 5  Registration 3  Attention/ Calculation 5  Recall 3  Language- name 2 objects 2  Language- repeat 1  Language- follow 3 step command 3  Language- read & follow direction 1  Write a sentence 1  Copy design 1  Total score 29        11/18/2022    3:32 PM  6CIT Screen  What Year? 0 points  What  month? 0 points  What time? 0 points  Count back from 20 0 points  Months in reverse 0 points  Repeat phrase 0 points  Total Score 0 points    Immunization History  Administered Date(s) Administered   Fluad Quad(high Dose 65+) 08/11/2022   Influenza, High Dose Seasonal PF 07/07/2017   Influenza-Unspecified 07/04/2014   PFIZER(Purple Top)SARS-COV-2 Vaccination 11/23/2019, 12/14/2019   PNEUMOCOCCAL CONJUGATE-20 08/11/2022   Pneumococcal Conjugate-13 05/26/2017    Pneumococcal Polysaccharide-23 03/22/2008   Tdap 07/04/2014   Zoster, Live 07/04/2014    Qualifies for Shingles Vaccine? Yes  . Due for Shingrix. Education has been provided regarding the importance of this vaccine. Pt has been advised to call insurance company to determine out of pocket expense. Advised may also receive vaccine at local pharmacy or Health Dept. Verbalized acceptance and understanding.  Tdap: Although this vaccine is not a covered service during a Wellness Exam, does the patient still wish to receive this vaccine today?  No .  Education has been provided regarding the importance of this vaccine. Advised may receive this vaccine at local pharmacy or Health Dept. Aware to provide a copy of the vaccination record if obtained from local pharmacy or Health Dept. Verbalized acceptance and understanding.  Flu Vaccine: Completed  Pneumococcal Vaccine: Completed  Covid-19 Vaccine: Completed vaccines  Screening Tests Health Maintenance  Topic Date Due   Zoster Vaccines- Shingrix (1 of 2) Never done   Medicare Annual Wellness (AWV)  05/08/2021   COVID-19 Vaccine (3 - Pfizer risk series) 11/26/2022 (Originally 01/11/2020)   DTaP/Tdap/Td (2 - Td or Tdap) 07/04/2024   Pneumonia Vaccine 13+ Years old  Completed   INFLUENZA VACCINE  Completed   DEXA SCAN  Completed   Hepatitis C Screening  Completed   HPV VACCINES  Aged Out   COLONOSCOPY (Pts 45-55yr Insurance coverage will need to be confirmed)  Discontinued    Cancer Screenings:  Colorectal Screening: Completed 07/22/2017.   Mammogram:  No longer required.  Bone Density: Completed 09/03/2020. Results reflect OSTEOPENIA, Repeat every 2 years. Referral placed today   Lung Cancer Screening: (Low Dose CT Chest recommended if Age 80-80years, 30 pack-year currently smoking OR have quit w/in 15years.) does not qualify.    Additional Screening:  Hepatitis C Screening: Completed 05/28/2020  Vision Screening: Recommended annual  ophthalmology exams for early detection of glaucoma and other disorders of the eye. Is the patient up to date with their annual eye exam?  Yes  Who is the provider or what is the name of the office in which the pt attends annual eye exams? [facility]   Dental Screening: Recommended annual dental exams for proper oral hygiene  Community Resource Referral:  CRR required this visit?  No       Plan:  I have personally reviewed and addressed the Medicare Annual Wellness questionnaire and have noted the following in the patient's chart:  A. Medical and social history B. Use of alcohol, tobacco or illicit drugs  C. Current medications and supplements D. Functional ability and status E.  Nutritional status F.  Physical activity G. Advance directives H. List of other physicians I.  Hospitalizations, surgeries, and ER visits in previous 12 months J.  [place] as hearing and vision if needed, cognitive and depression L. Referrals and appointments   In addition, I have reviewed and discussed with patient certain preventive protocols, quality metrics, and best practice recommendations. A written personalized care plan for preventive services as well  as general preventive health recommendations were provided to patient.  Signed,    Talitha Givens, MHS, PA-C [facility]

## 2022-11-18 NOTE — Patient Instructions (Addendum)
Ms. Gane , Thank you for taking time to come for your Medicare Wellness Visit. I appreciate your ongoing commitment to your health goals. Please review the following plan we discussed and let me know if I can assist you in the future.   Screening recommendations/referrals: Colonoscopy: no longer needed  Mammogram: No longer recommended  Bone Density: Last completed in 2021, results show osteopenia, you can repeat this every 2 years. Please call the [facility] to schedule this.  Recommended yearly ophthalmology/optometry visit for glaucoma screening and checkup Recommended yearly dental visit for hygiene and checkup  Vaccinations: Influenza vaccine: completed for 2023 flu season  Pneumococcal vaccine: Completed vaccine series  Tdap vaccine: Up to date, next due in 2025 Shingles vaccine: never done, please speak to your PCP or pharmacy about getting these vaccines    Covid-19: Please stay up to date on the vaccine and booster recommendations for your age group.    Advanced directives: Not on file with office. I have attached information on Advanced directives for you to review   Conditions/risks identified: None   Next appointment: Follow up in one year for your annual wellness visit    Preventive Care 65 Years and Older, Female Preventive care refers to lifestyle choices and visits with your health care provider that can promote health and wellness. What does preventive care include? A yearly physical exam. This is also called an annual well check. Dental exams once or twice a year. Routine eye exams. Ask your health care provider how often you should have your eyes checked. Personal lifestyle choices, including: Daily care of your teeth and gums. Regular physical activity. Eating a healthy diet. Avoiding tobacco and drug use. Limiting alcohol use. Practicing safe sex. Taking low-dose aspirin every day. Taking vitamin and mineral supplements as recommended by your  health care provider. What happens during an annual well check? The services and screenings done by your health care provider during your annual well check will depend on your age, overall health, lifestyle risk factors, and family history of disease. Counseling  Your health care provider may ask you questions about your: Alcohol use. Tobacco use. Drug use. Emotional well-being. Home and relationship well-being. Sexual activity. Eating habits. History of falls. Memory and ability to understand (cognition). Work and work Statistician. Reproductive health. Screening  You may have the following tests or measurements: Height, weight, and BMI. Blood pressure. Lipid and cholesterol levels. These may be checked every 5 years, or more frequently if you are over 29 years old. Skin check. Lung cancer screening. You may have this screening every year starting at age 91 if you have a 30-pack-year history of smoking and currently smoke or have quit within the past 15 years. Fecal occult blood test (FOBT) of the stool. You may have this test every year starting at age 35. Flexible sigmoidoscopy or colonoscopy. You may have a sigmoidoscopy every 5 years or a colonoscopy every 10 years starting at age 13. Hepatitis C blood test. Hepatitis B blood test. Sexually transmitted disease (STD) testing. Diabetes screening. This is done by checking your blood sugar (glucose) after you have not eaten for a while (fasting). You may have this done every 1-3 years. Bone density scan. This is done to screen for osteoporosis. You may have this done starting at age 25. Mammogram. This may be done every 1-2 years. Talk to your health care provider about how often you should have regular mammograms. Talk with your health care provider about your test results,  treatment options, and if necessary, the need for more tests. Vaccines  Your health care provider may recommend certain vaccines, such as: Influenza vaccine.  This is recommended every year. Tetanus, diphtheria, and acellular pertussis (Tdap, Td) vaccine. You may need a Td booster every 10 years. Zoster vaccine. You may need this after age 92. Pneumococcal 13-valent conjugate (PCV13) vaccine. One dose is recommended after age 37. Pneumococcal polysaccharide (PPSV23) vaccine. One dose is recommended after age 37. Talk to your health care provider about which screenings and vaccines you need and how often you need them. This information is not intended to replace advice given to you by your health care provider. Make sure you discuss any questions you have with your health care provider. Document Released: 11/09/2015 Document Revised: 07/02/2016 Document Reviewed: 08/14/2015 Elsevier Interactive Patient Education  2017 [place] Prevention in the Home Falls can cause injuries. They can happen to people of all ages. There are many things you can do to make your home safe and to help prevent falls. What can I do on the outside of my home? Regularly fix the edges of walkways and driveways and fix any cracks. Remove anything that might make you trip as you walk through a door, such as a raised step or threshold. Trim any bushes or trees on the path to your home. Use bright outdoor lighting. Clear any walking paths of anything that might make someone trip, such as rocks or tools. Regularly check to see if handrails are loose or broken. Make sure that both sides of any steps have handrails. Any raised decks and porches should have guardrails on the edges. Have any leaves, snow, or ice cleared regularly. Use sand or salt on walking paths during winter. Clean up any spills in your garage right away. This includes oil or grease spills. What can I do in the bathroom? Use night lights. Install grab bars by the toilet and in the tub and shower. Do not use towel bars as grab bars. Use non-skid mats or decals in the tub or shower. If you need to sit  down in the shower, use a plastic, non-slip stool. Keep the floor dry. Clean up any water that spills on the floor as soon as it happens. Remove soap buildup in the tub or shower regularly. Attach bath mats securely with double-sided non-slip rug tape. Do not have throw rugs and other things on the floor that can make you trip. What can I do in the bedroom? Use night lights. Make sure that you have a light by your bed that is easy to reach. Do not use any sheets or blankets that are too big for your bed. They should not hang down onto the floor. Have a firm chair that has side arms. You can use this for support while you get dressed. Do not have throw rugs and other things on the floor that can make you trip. What can I do in the kitchen? Clean up any spills right away. Avoid walking on wet floors. Keep items that you use a lot in easy-to-reach places. If you need to reach something above you, use a strong step stool that has a grab bar. Keep electrical cords out of the way. Do not use floor polish or wax that makes floors slippery. If you must use wax, use non-skid floor wax. Do not have throw rugs and other things on the floor that can make you trip. What can I do with my stairs? Do  not leave any items on the stairs. Make sure that there are handrails on both sides of the stairs and use them. Fix handrails that are broken or loose. Make sure that handrails are as long as the stairways. Check any carpeting to make sure that it is firmly attached to the stairs. Fix any carpet that is loose or worn. Avoid having throw rugs at the top or bottom of the stairs. If you do have throw rugs, attach them to the floor with carpet tape. Make sure that you have a light switch at the top of the stairs and the bottom of the stairs. If you do not have them, ask someone to add them for you. What else can I do to help prevent falls? Wear shoes that: Do not have high heels. Have rubber bottoms. Are  comfortable and fit you well. Are closed at the toe. Do not wear sandals. If you use a stepladder: Make sure that it is fully opened. Do not climb a closed stepladder. Make sure that both sides of the stepladder are locked into place. Ask someone to hold it for you, if possible. Clearly mark and make sure that you can see: Any grab bars or handrails. First and last steps. Where the edge of each step is. Use tools that help you move around (mobility aids) if they are needed. These include: Canes. Walkers. Scooters. Crutches. Turn on the lights when you go into a dark area. Replace any light bulbs as soon as they burn out. Set up your furniture so you have a clear path. Avoid moving your furniture around. If any of your floors are uneven, fix them. If there are any pets around you, be aware of where they are. Review your medicines with your doctor. Some medicines can make you feel dizzy. This can increase your chance of falling. Ask your doctor what other things that you can do to help prevent falls. This information is not intended to replace advice given to you by your health care provider. Make sure you discuss any questions you have with your health care provider. Document Released: 08/09/2009 Document Revised: 03/20/2016 Document Reviewed: 11/17/2014 Elsevier Interactive Patient Education  2017 [employer].

## 2022-11-18 NOTE — Telephone Encounter (Signed)
Patient called and I scheduled an appt to discuss and do RX

## 2022-11-18 NOTE — Telephone Encounter (Signed)
Pt is inquiring about getting a prescription for adult diapers. She heard that insurance could possibly cover it so I told her to contact them to make sure. Pt uses [facility]  Pt has appt sch'd for 4.22.2024 and would like to know if you would like for her to keep this date or if your okay with her moving it out to a six month appt. Please advise

## 2022-11-21 NOTE — Progress Notes (Unsigned)
   Acute Office Visit  Subjective:     Patient ID: Rebecca Faulkner, female    DOB: [birth year], 82 y.o.   MRN: [id number]  No chief complaint on file.   HPI Patient is in today for urinary incontinence and need for depends.   URINARY SYMPTOMS  Dysuria: {Blank single:19197::"yes","no","burning"} Urinary frequency: {Blank single:19197::"yes","no"} Urgency: {Blank single:19197::"yes","no"} Small volume voids: {Blank single:19197::"yes","no"} Symptom severity: {Blank single:19197::"yes","no"} Urinary incontinence: {Blank single:19197::"yes","no"} Foul odor: {Blank single:19197::"yes","no"} Hematuria: {Blank single:19197::"yes","no"} Abdominal pain: {Blank single:19197::"yes","no"} Back pain: {Blank single:19197::"yes","no"} Suprapubic pain/pressure: {Blank single:19197::"yes","no"} Flank pain: {Blank single:19197::"yes","no"} Fever:  {Blank multiple:19196::"yes","no","subjective","low grade"} Vomiting: {Blank single:19197::"yes","no"} Relief with cranberry juice: {Blank single:19197::"yes","no"} Relief with pyridium: {Blank single:19197::"yes","no"} Status: better/worse/stable Previous urinary tract infection: {Blank single:19197::"yes","no"} Recurrent urinary tract infection: {Blank single:19197::"yes","no"} Sexual activity: No sexually active/monogomous/practicing safe sex History of sexually transmitted disease: {Blank single:19197::"yes","no"} Penile discharge: {Blank single:19197::"yes","no"} Treatments attempted: {Blank multiple:19196::"none","antibiotics","pyridium","cranberry","increasing fluids"}     ROS      Objective:    There were no vitals taken for this visit. {Vitals History (Optional):23777}  Physical Exam  No results found for any visits on 11/24/22.      Assessment & Plan:   Problem List Items Addressed This Visit   None   No orders of the defined types were placed in this encounter.   No follow-ups on file.  Teodora Medici, DO

## 2022-11-24 ENCOUNTER — Encounter: Payer: Self-pay | Admitting: Internal Medicine

## 2022-11-24 ENCOUNTER — Ambulatory Visit (INDEPENDENT_AMBULATORY_CARE_PROVIDER_SITE_OTHER): Payer: Medicare Other | Admitting: Internal Medicine

## 2022-11-24 VITALS — BP 122/84 | HR 98 | Temp 97.6°F | Resp 18 | Wt 143.8 lb

## 2022-11-24 DIAGNOSIS — N393 Stress incontinence (female) (male): Secondary | ICD-10-CM

## 2022-11-24 DIAGNOSIS — H524 Presbyopia: Secondary | ICD-10-CM | POA: Diagnosis not present

## 2022-11-24 NOTE — Patient Instructions (Addendum)
It was great seeing you today!  Plan discussed at today's visit: -Prescription sent to depends, recommend Kegel exercises to help strength pelvic muscles, can also see a physical therapist for muscle strengthening as well   Follow up in: already scheduled for April  Take care and let us know if you have any questions or concerns prior to your next visit.  Dr. Rosana Berger   Kegel Exercises  Kegel exercises can help strengthen your pelvic floor muscles. The pelvic floor is a group of muscles that support your rectum, small intestine, and bladder. In females, pelvic floor muscles also help support the uterus. These muscles help you control the flow of urine and stool (feces). Kegel exercises are painless and simple. They do not require any equipment. Your provider may suggest Kegel exercises to: Improve bladder and bowel control. Improve sexual response. Improve weak pelvic floor muscles after surgery to remove the uterus (hysterectomy) or after pregnancy, in females. Improve weak pelvic floor muscles after prostate gland removal or surgery, in males. Kegel exercises involve squeezing your pelvic floor muscles. These are the same muscles you squeeze when you try to stop the flow of urine or keep from passing gas. The exercises can be done while sitting, standing, or lying down, but it is best to vary your position. Ask your health care provider which exercises are safe for you. Do exercises exactly as told by your health care provider and adjust them as directed. Do not begin these exercises until told by your health care provider. Exercises How to do Kegel exercises: Squeeze your pelvic floor muscles tight. You should feel a tight lift in your rectal area. If you are a female, you should also feel a tightness in your vaginal area. Keep your stomach, buttocks, and legs relaxed. Hold the muscles tight for up to 10 seconds. Breathe normally. Relax your muscles for up to 10 seconds. Repeat as told  by your health care provider. Repeat this exercise daily as told by your health care provider. Continue to do this exercise for at least 4-6 weeks, or for as long as told by your health care provider. You may be referred to a physical therapist who can help you learn more about how to do Kegel exercises. Depending on your condition, your health care provider may recommend: Varying how long you squeeze your muscles. Doing several sets of exercises every day. Doing exercises for several weeks. Making Kegel exercises a part of your regular exercise routine. This information is not intended to replace advice given to you by your health care provider. Make sure you discuss any questions you have with your health care provider. Document Revised: 02/21/2021 Document Reviewed: 02/21/2021 Elsevier Patient Education  [place].

## 2022-12-01 ENCOUNTER — Ambulatory Visit: Payer: Medicare Other | Admitting: Podiatry

## 2022-12-01 ENCOUNTER — Encounter: Payer: Self-pay | Admitting: Podiatry

## 2022-12-01 VITALS — BP 124/74 | HR 101

## 2022-12-01 DIAGNOSIS — B351 Tinea unguium: Secondary | ICD-10-CM | POA: Diagnosis not present

## 2022-12-01 DIAGNOSIS — M79675 Pain in left toe(s): Secondary | ICD-10-CM

## 2022-12-01 DIAGNOSIS — M79674 Pain in right toe(s): Secondary | ICD-10-CM

## 2022-12-01 MED ORDER — FLUCONAZOLE 150 MG PO TABS: 150.0000 mg | ORAL_TABLET | ORAL | 0 refills | Status: AC

## 2022-12-02 NOTE — Progress Notes (Signed)
  Subjective:  Patient ID: Rebecca Faulkner, female    DOB: [birth year],  MRN: [id number]  Chief Complaint  Patient presents with   Nail Problem    "Look at my feet and trim my toenails." N - toenails L - 1-5  b/l D - 4-5 yrs O - gradually worse C - long, thick, discolored A - none T - none    82 y.o. female presents with the above complaint. History confirmed with patient.   Objective:  Physical Exam: warm, good capillary refill, no trophic changes or ulcerative lesions, normal DP and PT pulses, and normal sensory exam. Left Foot: dystrophic yellowed discolored nail plates with subungual debris Right Foot: dystrophic yellowed discolored nail plates with subungual debris  Assessment:   1. Pain due to onychomycosis of toenails of both feet      Plan:  Patient was evaluated and treated and all questions answered.  Discussed the etiology and treatment options for the condition in detail with the patient. Educated patient on the topical and oral treatment options for mycotic nails. Recommended debridement of the nails today. Sharp and mechanical debridement performed of all painful and mycotic nails today. Nails debrided in length and thickness using a nail nipper to level of comfort. Discussed treatment options including appropriate shoe gear. Follow up as needed for painful nails.  She is interested in treatment with an oral agent.  We discussed pulsed dosing with fluconazole as well as Lamisil or topical options.  I recommended treatment with pulsed dosing fluconazole.  We discussed the risk of simvastatin interaction and myalgia, hopefully low-dose and intermittent weekly will be able to avoid this but she will let me know if any side effects develop.  I will see her back in 3 months for follow-up  Return in about 3 months (around 03/01/2023) for painful thick fungal nails.

## 2023-01-12 DIAGNOSIS — G629 Polyneuropathy, unspecified: Secondary | ICD-10-CM | POA: Diagnosis not present

## 2023-01-12 DIAGNOSIS — R202 Paresthesia of skin: Secondary | ICD-10-CM | POA: Diagnosis not present

## 2023-01-12 DIAGNOSIS — R531 Weakness: Secondary | ICD-10-CM | POA: Diagnosis not present

## 2023-01-12 DIAGNOSIS — F411 Generalized anxiety disorder: Secondary | ICD-10-CM | POA: Diagnosis not present

## 2023-02-15 NOTE — Progress Notes (Unsigned)
Established Patient Office Visit  Subjective:     Patient ID: Rebecca Faulkner, female    DOB: 1940-11-22, 82 y.o.   MRN: [id number]  No chief complaint on file.   HPI Patient is in today for follow up on numbness in right upper and lower extremity.  This started a few months ago, CT of the head was obtained on 04/21/2022, which was negative for acute abnormalities.  Laboratory work-up significant only for low vitamin B1 levels, she is currently on a B complex supplement.  Her vitamin D levels were also low, she has completed her prescription strength supplements and is now on over-the-counter vitamin D 2000 IUs daily.  She cannot undergo an MRI due to metal plates in her left humerus and right femur.  She was seen by neurology at [facility] on 07/28/2022 and again on 10/06/22. EMG on 11/27 showing chronic severe polyneuropathy.  She was started on gabapentin 100-200 mg nightly. She had been taking this and doing well but stopped because she was sick with the flu around Christmas and was nauseated. She recently started taking the Gabapentin again.   HLD: -Medications: Zocor 40 mg -Patient is compliant with above medications and reports no side effects. Doing well.  -Last lipid panel: Lipid Panel     Component Value Date/Time   CHOL 209 (H) 11/10/2022 1034   TRIG 94 11/10/2022 1034   HDL 64 11/10/2022 1034   CHOLHDL 3.3 11/10/2022 1034   LDLCALC 125 (H) 11/10/2022 1034   The 10-year ASCVD risk score (Arnett DK, et al., 2019) is: 24.3%   Values used to calculate the score:     Age: 41 years     Sex: Female     Is Non-Hispanic African American: No     Diabetic: No     Tobacco smoker: No     Systolic Blood Pressure: 127 mmHg     Is BP treated: No     HDL Cholesterol: 64 mg/dL     Total Cholesterol: 209 mg/dL  Health maintenance: -Blood work up-to-date -Colon cancer screening: Colonoscopy 2018   Review of Systems  Constitutional:  Negative for chills and fever.  Eyes:   Negative for blurred vision.  Respiratory:  Negative for shortness of breath.   Cardiovascular:  Negative for chest pain.  Neurological:  Positive for tingling.        Objective:    There were no vitals taken for this visit. BP Readings from Last 3 Encounters:  12/01/22 124/74  11/24/22 122/84  11/18/22 116/72   Wt Readings from Last 3 Encounters:  11/24/22 143 lb 12.8 oz (65.2 kg)  11/18/22 147 lb (66.7 kg)  11/10/22 142 lb 11.2 oz (64.7 kg)      Physical Exam Constitutional:      Appearance: Normal appearance.  HENT:     Head: Normocephalic and atraumatic.  Eyes:     Conjunctiva/sclera: Conjunctivae normal.  Cardiovascular:     Rate and Rhythm: Normal rate and regular rhythm.  Pulmonary:     Effort: Pulmonary effort is normal.     Breath sounds: Normal breath sounds.  Musculoskeletal:     Right foot: Normal range of motion. No deformity, bunion, Charcot foot, foot drop or prominent metatarsal heads.     Left foot: No deformity, bunion, Charcot foot, foot drop or prominent metatarsal heads.  Feet:     Right foot:     Protective Sensation: 6 sites tested.  6 sites sensed.  Skin integrity: Skin integrity normal.     Toenail Condition: Right toenails are abnormally thick and long.     Left foot:     Protective Sensation: 6 sites tested.  6 sites sensed.     Skin integrity: Skin integrity normal.     Toenail Condition: Left toenails are abnormally thick and long.  Skin:    General: Skin is warm and dry.  Neurological:     General: No focal deficit present.     Mental Status: She is alert. Mental status is at baseline.  Psychiatric:        Mood and Affect: Mood normal.        Behavior: Behavior normal.     No results found for any visits on 02/16/23.      Assessment & Plan:   1. Mixed hyperlipidemia: Recheck fasting lipid panel today. Continue Zocor 20 mg.  - Lipid Profile  2. Thiamine deficiency: Recheck levels. She is on a B complex vitamin.   -  Vitamin B1  3. Elevated glucose: Sugar high when she was in the ER, strong family history of diabetes will check A1c today.   - HgB A1c  4. Onychauxis: Referral to Podiatry as she cannot cut her own toenails due to hip/back pain.  - Ambulatory referral to Podiatry   No follow-ups on file.  Margarita Mail, DO

## 2023-02-16 ENCOUNTER — Ambulatory Visit (INDEPENDENT_AMBULATORY_CARE_PROVIDER_SITE_OTHER): Payer: Medicare Other | Admitting: Internal Medicine

## 2023-02-16 ENCOUNTER — Encounter: Payer: Self-pay | Admitting: Internal Medicine

## 2023-02-16 VITALS — BP 120/74 | HR 89 | Temp 98.1°F | Resp 16 | Ht 62.0 in | Wt 147.1 lb

## 2023-02-16 DIAGNOSIS — E782 Mixed hyperlipidemia: Secondary | ICD-10-CM

## 2023-02-16 DIAGNOSIS — G629 Polyneuropathy, unspecified: Secondary | ICD-10-CM

## 2023-02-16 DIAGNOSIS — B351 Tinea unguium: Secondary | ICD-10-CM

## 2023-02-16 DIAGNOSIS — N393 Stress incontinence (female) (male): Secondary | ICD-10-CM | POA: Diagnosis not present

## 2023-02-16 DIAGNOSIS — R7303 Prediabetes: Secondary | ICD-10-CM | POA: Diagnosis not present

## 2023-02-16 MED ORDER — SIMVASTATIN 40 MG PO TABS: 40.0000 mg | ORAL_TABLET | Freq: Every day | ORAL | 1 refills | Status: DC

## 2023-03-02 ENCOUNTER — Ambulatory Visit: Payer: Medicare Other | Admitting: Podiatry

## 2023-04-06 ENCOUNTER — Ambulatory Visit: Payer: Medicare Other | Admitting: Podiatry

## 2023-04-06 DIAGNOSIS — B351 Tinea unguium: Secondary | ICD-10-CM

## 2023-04-06 DIAGNOSIS — M79674 Pain in right toe(s): Secondary | ICD-10-CM

## 2023-04-06 DIAGNOSIS — M79675 Pain in left toe(s): Secondary | ICD-10-CM

## 2023-04-07 NOTE — Progress Notes (Signed)
  Subjective:  Patient ID: Rebecca Faulkner, female    DOB: 1940-11-29,  MRN: [id number]  Chief Complaint  Patient presents with   Nail Problem    Thick painful toenails, 3 month follow up      82 y.o. female presents with the above complaint. History confirmed with patient.  She did not take the medication, states they are worried about the side effects  Objective:  Physical Exam: warm, good capillary refill, no trophic changes or ulcerative lesions, normal DP and PT pulses, and normal sensory exam. Left Foot: dystrophic yellowed discolored nail plates with subungual debris Right Foot: dystrophic yellowed discolored nail plates with subungual debris  Assessment:   1. Pain due to onychomycosis of toenails of both feet      Plan:  Patient was evaluated and treated and all questions answered.  Discussed the etiology and treatment options for the condition in detail with the patient. Recommended debridement of the nails today. Sharp and mechanical debridement performed of all painful and mycotic nails today. Nails debrided in length and thickness using a nail nipper to level of comfort. Discussed treatment options including appropriate shoe gear. Follow up as needed for painful nails.     Return in about 3 months (around 07/07/2023) for painful thick fungal nails.

## 2023-04-27 DIAGNOSIS — H35413 Lattice degeneration of retina, bilateral: Secondary | ICD-10-CM | POA: Diagnosis not present

## 2023-04-27 DIAGNOSIS — Z961 Presence of intraocular lens: Secondary | ICD-10-CM | POA: Diagnosis not present

## 2023-07-13 ENCOUNTER — Encounter: Payer: Self-pay | Admitting: Podiatry

## 2023-07-13 ENCOUNTER — Ambulatory Visit: Payer: Medicare Other | Admitting: Podiatry

## 2023-07-13 VITALS — BP 123/71 | HR 80

## 2023-07-13 DIAGNOSIS — F411 Generalized anxiety disorder: Secondary | ICD-10-CM | POA: Diagnosis not present

## 2023-07-13 DIAGNOSIS — R200 Anesthesia of skin: Secondary | ICD-10-CM | POA: Diagnosis not present

## 2023-07-13 DIAGNOSIS — B351 Tinea unguium: Secondary | ICD-10-CM

## 2023-07-13 DIAGNOSIS — M79674 Pain in right toe(s): Secondary | ICD-10-CM | POA: Diagnosis not present

## 2023-07-13 DIAGNOSIS — R531 Weakness: Secondary | ICD-10-CM | POA: Diagnosis not present

## 2023-07-13 DIAGNOSIS — G629 Polyneuropathy, unspecified: Secondary | ICD-10-CM | POA: Diagnosis not present

## 2023-07-13 DIAGNOSIS — M79675 Pain in left toe(s): Secondary | ICD-10-CM

## 2023-07-13 NOTE — Progress Notes (Signed)
Subjective:  Patient ID: Rebecca Faulkner, female    DOB: November 05, 1940,  MRN: [id number]  Chief Complaint  Patient presents with   Nail Problem    "Let us know if they're doing better or if we need to get more of that medicine."    82 y.o. female presents with the above complaint. History confirmed with patient.    Objective:  Physical Exam: warm, good capillary refill, no trophic changes or ulcerative lesions, normal DP and PT pulses, and normal sensory exam. Left Foot: dystrophic yellowed discolored nail plates with subungual debris Right Foot: dystrophic yellowed discolored nail plates with subungual debris  Assessment:   1. Pain due to onychomycosis of toenails of both feet      Plan:  Patient was evaluated and treated and all questions answered.  Discussed the etiology and treatment options for the condition in detail with the patient. Recommended debridement of the nails today. Sharp and mechanical debridement performed of all painful and mycotic nails today. Nails debrided in length and thickness using a nail nipper to level of comfort. Discussed treatment options including appropriate shoe gear.   Do not think further oral AF therapy will allow much improvement, would recommend intermittent debridement as needed     Return in about 3 months (around 10/12/2023) for painful thick fungal nails.

## 2023-07-20 DIAGNOSIS — R202 Paresthesia of skin: Secondary | ICD-10-CM | POA: Diagnosis not present

## 2023-07-20 DIAGNOSIS — R200 Anesthesia of skin: Secondary | ICD-10-CM | POA: Diagnosis not present

## 2023-07-20 DIAGNOSIS — F411 Generalized anxiety disorder: Secondary | ICD-10-CM | POA: Diagnosis not present

## 2023-07-20 DIAGNOSIS — R531 Weakness: Secondary | ICD-10-CM | POA: Diagnosis not present

## 2023-07-20 DIAGNOSIS — E785 Hyperlipidemia, unspecified: Secondary | ICD-10-CM | POA: Diagnosis not present

## 2023-07-20 DIAGNOSIS — R252 Cramp and spasm: Secondary | ICD-10-CM | POA: Diagnosis not present

## 2023-07-20 DIAGNOSIS — G629 Polyneuropathy, unspecified: Secondary | ICD-10-CM | POA: Diagnosis not present

## 2023-08-16 NOTE — Progress Notes (Unsigned)
Established Patient Office Visit  Subjective:     Patient ID: Rebecca Faulkner, female    DOB: Mar 02, 1941, 82 y.o.   MRN: [id number]  No chief complaint on file.   HPI Patient is in today for follow up on chronic medical conditions.  At her last office visit, we discussed urinary incontinence.  Patient states that she never received her urinary labs and needs this reordered.  Onychomycosis: She did see podiatry on 12/01/2022 and had her toenails cut.  She was also started on fluconazole 150 mg weekly for onychomycosis.  Patient states that she has been compliant with this regimen and reports no side effects.  She is following up with podiatry and may for recheck.  She is on a statin, liver enzymes were last checked in December 2023 and were normal at that time.  Neuropathy: -CT of the head was obtained on 04/21/2022, which was negative for acute abnormalities.  -Laboratory work-up significant only for low vitamin B1 levels, she is currently on a B complex supplement.   -Her vitamin D levels were also low, she has completed her prescription strength supplements and is now on over-the-counter vitamin D 2000 IUs daily.  She cannot undergo an MRI due to metal plates in her left humerus and right femur.   -She is following with neurology at [facility] clinic on 01/12/23.  -EMG on 11/27 showing chronic severe polyneuropathy.   -Currently on gabapentin 200-300 mg nightly.  HLD: -Medications: Zocor 40 mg -Patient is compliant with above medications and reports no side effects. Doing well.  -Last lipid panel: Lipid Panel     Component Value Date/Time   CHOL 209 (H) 11/10/2022 1034   TRIG 94 11/10/2022 1034   HDL 64 11/10/2022 1034   CHOLHDL 3.3 11/10/2022 1034   LDLCALC 125 (H) 11/10/2022 1034   The ASCVD Risk score (Arnett DK, et al., 2019) failed to calculate for the following reasons:   The 2019 ASCVD risk score is only valid for ages 92 to 58  Pre-Diabetes: -Last A1c 1/24 5.8% -Not  currently on any medication, does eat chocolate and sweets frequently   Health maintenance: -Blood work up-to-date -Colon cancer screening: Colonoscopy 2018   Review of Systems  Constitutional:  Negative for chills and fever.  Eyes:  Negative for blurred vision.  Respiratory:  Negative for shortness of breath.   Cardiovascular:  Negative for chest pain.  Neurological:  Positive for tingling.        Objective:    There were no vitals taken for this visit. BP Readings from Last 3 Encounters:  07/13/23 123/71  02/16/23 120/74  12/01/22 124/74   Wt Readings from Last 3 Encounters:  02/16/23 147 lb 1.6 oz (66.7 kg)  11/24/22 143 lb 12.8 oz (65.2 kg)  11/18/22 147 lb (66.7 kg)      Physical Exam Constitutional:      Appearance: Normal appearance.  HENT:     Head: Normocephalic and atraumatic.  Eyes:     Conjunctiva/sclera: Conjunctivae normal.  Cardiovascular:     Rate and Rhythm: Normal rate and regular rhythm.  Pulmonary:     Effort: Pulmonary effort is normal.     Breath sounds: Normal breath sounds.  Musculoskeletal:     Right lower leg: No edema.     Left lower leg: No edema.  Skin:    General: Skin is warm and dry.  Neurological:     General: No focal deficit present.     Mental Status: She  is alert. Mental status is at baseline.  Psychiatric:        Mood and Affect: Mood normal.        Behavior: Behavior normal.     No results found for any visits on 08/17/23.      Assessment & Plan:   1. Mixed hyperlipidemia: Chronic and stable. Continue Zocor 40 mg, refilled.  - simvastatin (ZOCOR) 40 MG tablet; Take 1 tablet (40 mg total) by mouth at bedtime.  Dispense: 90 tablet; Refill: 1  2. Stress incontinence: DME order faxed again. Patient will let me know if she is not able to get this filled.  3. Onychomycosis: Following with Podiatry, note from 12/21/22 reviewed. Patient is currently on Fluconazole 150 mg weekly, seeing Podiatry next month. Plan on  checking LFT's at follow up.   4. Pre-diabetes: Discussed decreasing sugar and carbs in the diet, recheck at follow up.   5. Neuropathy: Stable, following with Neurology, note from 01/12/23 reviewed. Currently on Gabapentin 200-300 mg at night.   No follow-ups on file.  Margarita Mail, DO

## 2023-08-17 ENCOUNTER — Encounter: Payer: Self-pay | Admitting: Internal Medicine

## 2023-08-17 ENCOUNTER — Ambulatory Visit: Payer: Medicare Other | Admitting: Internal Medicine

## 2023-08-17 VITALS — BP 124/74 | HR 87 | Temp 97.6°F | Resp 18 | Ht 62.0 in | Wt 148.7 lb

## 2023-08-17 DIAGNOSIS — E782 Mixed hyperlipidemia: Secondary | ICD-10-CM

## 2023-08-17 DIAGNOSIS — Z23 Encounter for immunization: Secondary | ICD-10-CM | POA: Diagnosis not present

## 2023-08-17 DIAGNOSIS — R7303 Prediabetes: Secondary | ICD-10-CM

## 2023-08-17 DIAGNOSIS — G629 Polyneuropathy, unspecified: Secondary | ICD-10-CM

## 2023-08-17 MED ORDER — SIMVASTATIN 40 MG PO TABS: 40.0000 mg | ORAL_TABLET | Freq: Every day | ORAL | 1 refills | Status: DC

## 2023-08-18 ENCOUNTER — Ambulatory Visit: Payer: Medicare Other | Admitting: Internal Medicine

## 2023-10-05 ENCOUNTER — Encounter: Payer: Self-pay | Admitting: Podiatry

## 2023-10-05 ENCOUNTER — Ambulatory Visit: Payer: Medicare Other | Admitting: Podiatry

## 2023-10-05 DIAGNOSIS — B351 Tinea unguium: Secondary | ICD-10-CM | POA: Diagnosis not present

## 2023-10-05 DIAGNOSIS — M79675 Pain in left toe(s): Secondary | ICD-10-CM | POA: Diagnosis not present

## 2023-10-05 DIAGNOSIS — M79674 Pain in right toe(s): Secondary | ICD-10-CM

## 2023-10-05 NOTE — Progress Notes (Signed)
  Subjective:  Patient ID: Rebecca Faulkner, female    DOB: 10-25-1941,  MRN: [id number]  Chief Complaint  Patient presents with   Nail Problem    "Trim my toenails, I think."    82 y.o. female presents with the above complaint. History confirmed with patient.  No new issues debridement has been helpful  Objective:  Physical Exam: warm, good capillary refill, no trophic changes or ulcerative lesions, normal DP and PT pulses, and normal sensory exam. Left Foot: dystrophic yellowed discolored nail plates with subungual debris Right Foot: dystrophic yellowed discolored nail plates with subungual debris  Assessment:   1. Pain due to onychomycosis of toenails of both feet      Plan:  Patient was evaluated and treated and all questions answered.  Discussed the etiology and treatment options for the condition in detail with the patient. Recommended debridement of the nails today. Sharp and mechanical debridement performed of all painful and mycotic nails today. Nails debrided in length and thickness using a nail nipper to level of comfort. Discussed treatment options including appropriate shoe gear.   Continue regular intermittent debridement has been helpful in reducing pain and improving function.     Return in about 3 months (around 01/03/2024) for painful thick fungal nails.

## 2023-11-13 ENCOUNTER — Encounter: Payer: Self-pay | Admitting: Hematology and Oncology

## 2023-11-13 ENCOUNTER — Emergency Department
Admission: EM | Admit: 2023-11-13 | Discharge: 2023-11-13 | Disposition: A | Discharge status: Home / Self Care | Payer: Medicare Other | Attending: Emergency Medicine | Admitting: Emergency Medicine

## 2023-11-13 ENCOUNTER — Other Ambulatory Visit: Payer: Self-pay

## 2023-11-13 DIAGNOSIS — R109 Unspecified abdominal pain: Secondary | ICD-10-CM | POA: Insufficient documentation

## 2023-11-13 DIAGNOSIS — Z20822 Contact with and (suspected) exposure to covid-19: Secondary | ICD-10-CM | POA: Insufficient documentation

## 2023-11-13 DIAGNOSIS — R112 Nausea with vomiting, unspecified: Secondary | ICD-10-CM | POA: Diagnosis not present

## 2023-11-13 DIAGNOSIS — K529 Noninfective gastroenteritis and colitis, unspecified: Secondary | ICD-10-CM | POA: Diagnosis not present

## 2023-11-13 DIAGNOSIS — R197 Diarrhea, unspecified: Secondary | ICD-10-CM | POA: Diagnosis not present

## 2023-11-13 LAB — COMPREHENSIVE METABOLIC PANEL
ALT: 25 U/L (ref 0–44)
AST: 34 U/L (ref 15–41)
Albumin: 4.4 g/dL (ref 3.5–5.0)
Alkaline Phosphatase: 62 U/L (ref 38–126)
Anion gap: 13 (ref 5–15)
BUN: 16 mg/dL (ref 8–23)
CO2: 19 mmol/L — ABNORMAL LOW (ref 22–32)
Calcium: 9.4 mg/dL (ref 8.9–10.3)
Chloride: 108 mmol/L (ref 98–111)
Creatinine, Ser: 0.75 mg/dL (ref 0.44–1.00)
GFR, Estimated: >60 mL/min (ref >60)
Glucose, Bld: 177 mg/dL — ABNORMAL HIGH (ref 70–99)
Potassium: 3.7 mmol/L (ref 3.5–5.1)
Sodium: 140 mmol/L (ref 135–145)
Total Bilirubin: 0.6 mg/dL (ref 0.0–1.2)
Total Protein: 7.6 g/dL (ref 6.5–8.1)

## 2023-11-13 LAB — URINALYSIS, ROUTINE W REFLEX MICROSCOPIC
Bilirubin Urine: NEGATIVE
Glucose, UA: NEGATIVE mg/dL
Ketones, ur: NEGATIVE mg/dL
Leukocytes,Ua: NEGATIVE
Nitrite: NEGATIVE
Protein, ur: 30 mg/dL — AB
Specific Gravity, Urine: 1.027 (ref 1.005–1.030)
pH: 5 (ref 5.0–8.0)

## 2023-11-13 LAB — CBC
HCT: 46.4 % — ABNORMAL HIGH (ref 36.0–46.0)
Hemoglobin: 15.4 g/dL — ABNORMAL HIGH (ref 12.0–15.0)
MCH: 30.1 pg (ref 26.0–34.0)
MCHC: 33.2 g/dL (ref 30.0–36.0)
MCV: 90.6 fL (ref 80.0–100.0)
Platelets: 248 10*3/uL (ref 150–400)
RBC: 5.12 MIL/uL — ABNORMAL HIGH (ref 3.87–5.11)
RDW: 13.4 % (ref 11.5–15.5)
WBC: 12.3 10*3/uL — ABNORMAL HIGH (ref 4.0–10.5)
nRBC: 0 % (ref 0.0–0.2)

## 2023-11-13 LAB — RESP PANEL BY RT-PCR (RSV, FLU A&B, COVID)  RVPGX2
Influenza A by PCR: NEGATIVE
Influenza B by PCR: NEGATIVE
Resp Syncytial Virus by PCR: NEGATIVE
SARS Coronavirus 2 by RT PCR: NEGATIVE

## 2023-11-13 LAB — LIPASE, BLOOD: Lipase: 33 U/L (ref 11–51)

## 2023-11-13 MED ORDER — ONDANSETRON HCL 4 MG/2ML IJ SOLN
4.0000 mg | Freq: Once | INTRAMUSCULAR | Status: AC
  Administered 2023-11-13: 4 mg via INTRAVENOUS
  Filled 2023-11-13: qty 2

## 2023-11-13 MED ORDER — ONDANSETRON 4 MG PO TBDP: 4.0000 mg | ORAL_TABLET | Freq: Three times a day (TID) | ORAL | 0 refills | Status: DC | PRN

## 2023-11-13 MED ORDER — SODIUM CHLORIDE 0.9 % IV SOLN: Freq: Once | INTRAVENOUS | Status: AC

## 2023-11-13 NOTE — ED Provider Notes (Signed)
[facility] Provider Note    Event Date/Time   First MD Initiated Contact with Patient 11/13/23 1021     (approximate)   History   Diarrhea   HPI  Rebecca Faulkner is a 83 y.o. female who presents with complaints of diarrhea, nausea, abdominal cramping which started yesterday.  She reports multiple episodes of diarrhea and vomiting.  She reports she is feeling somewhat improved now.  No sick contacts reported.  No fevers reported.      Physical Exam   Triage Vital Signs: ED Triage Vitals  Encounter Vitals Group     BP 11/13/23 0752 114/72     Systolic BP Percentile --      Diastolic BP Percentile --      Pulse Rate 11/13/23 0752 (!) 110     Resp 11/13/23 0752 19     Temp 11/13/23 0752 98.7 F (37.1 C)     Temp Source 11/13/23 0752 Oral     SpO2 11/13/23 0752 99 %     Weight 11/13/23 0743 67.1 kg (148 lb)     Height 11/13/23 0743 1.575 m (5\' 2" )     Head Circumference --      Peak Flow --      Pain Score 11/13/23 0743 0     Pain Loc --      Pain Education --      Exclude from Growth Chart --     Most recent vital signs: Vitals:   11/13/23 0752  BP: 114/72  Pulse: (!) 110  Resp: 19  Temp: 98.7 F (37.1 C)  SpO2: 99%     General: Awake, no distress.  CV:  Good peripheral perfusion.  Resp:  Normal effort.  Abd:  No distention.  Soft, nontender Other:     ED Results / Procedures / Treatments   Labs (all labs ordered are listed, but only abnormal results are displayed) Labs Reviewed  COMPREHENSIVE METABOLIC PANEL - Abnormal; Notable for the following components:      Result Value   CO2 19 (*)    Glucose, Bld 177 (*)    All other components within normal limits  CBC - Abnormal; Notable for the following components:   WBC 12.3 (*)    RBC 5.12 (*)    Hemoglobin 15.4 (*)    HCT 46.4 (*)    All other components within normal limits  URINALYSIS, ROUTINE W REFLEX MICROSCOPIC - Abnormal; Notable for the following components:    Color, Urine YELLOW (*)    APPearance HAZY (*)    Hgb urine dipstick SMALL (*)    Protein, ur 30 (*)    Bacteria, UA RARE (*)    All other components within normal limits  RESP PANEL BY RT-PCR (RSV, FLU A&B, COVID)  RVPGX2  LIPASE, BLOOD     EKG     RADIOLOGY     PROCEDURES:  Critical Care performed:   Procedures   MEDICATIONS ORDERED IN ED: Medications  0.9 %  sodium chloride infusion (0 mLs Intravenous Stopped 11/13/23 1145)  ondansetron (ZOFRAN) injection 4 mg (4 mg Intravenous Given 11/13/23 1100)     IMPRESSION / MDM / ASSESSMENT AND PLAN / ED COURSE  I reviewed the triage vital signs and the nursing notes. Patient's presentation is most consistent with acute presentation with potential threat to life or bodily function.  Patient presents with nausea vomiting diarrhea as detailed above, differential includes viral gastroenteritis, SBO/partial SBO  Abdominal exam is  quite reassuring, lab work notable for mild elevation of white blood cell count which is nonspecific, strongly suspect viral gastroenteritis given improving symptoms, prevalence to continue this time.  Will treat with IV fluids, IV Zofran and reevaluate  Patient continues to feel well and is asking to go home, she has no abdominal pain.  Appropriate discharge at this time with strict return precautions, patient agrees with this plan        FINAL CLINICAL IMPRESSION(S) / ED DIAGNOSES   Final diagnoses:  Gastroenteritis     Rx / DC Orders   ED Discharge Orders          Ordered    ondansetron (ZOFRAN-ODT) 4 MG disintegrating tablet  Every 8 hours PRN        11/13/23 1133             Note:  This document was prepared using Dragon voice recognition software and may include unintentional dictation errors.   Jene Every, MD 11/13/23 1153

## 2023-11-13 NOTE — ED Notes (Signed)
See triage notes. Patient c/o diarrhea and vomiting since yesterday.

## 2023-11-13 NOTE — ED Triage Notes (Addendum)
Pt comes with diarrhea and vomiting since yesterday afternoon. Pt states no belly pain. Pt denies any known fevers.

## 2023-11-13 NOTE — ED Provider Triage Note (Signed)
Emergency Medicine Provider Triage Evaluation Note  SHAKIERRA STRIKE , a 83 y.o. female  was evaluated in triage.  Pt complains of v/d started yesterday.  Review of Systems  Positive:  Negative:   Physical Exam  Ht 5\' 2"  (1.575 m)   Wt 67.1 kg   BMI 27.07 kg/m  Gen:   Awake, no distress   Resp:  Normal effort  MSK:   Moves extremities without difficulty  Other:    Medical Decision Making  Medically screening exam initiated at 7:45 AM.  Appropriate orders placed.  DAIZAH PERISHO was informed that the remainder of the evaluation will be completed by another provider, this initial triage assessment does not replace that evaluation, and the importance of remaining in the ED until their evaluation is complete.     Faythe Ghee, PA-C 11/13/23 0745

## 2023-11-19 ENCOUNTER — Telehealth: Payer: Self-pay

## 2023-11-19 NOTE — Progress Notes (Signed)
Transition Care Management Follow-up Telephone Call Date of discharge and from where: 11/13/2023 [facility] How have you been since you were released from the hospital? Patient stated she is feeling a little better but is still weak. Any questions or concerns? No  Items Reviewed: Did the pt receive and understand the discharge instructions provided? Yes  Medications obtained and verified? Yes  Other? No  Any new allergies since your discharge? No  Dietary orders reviewed? Yes Do you have support at home? Yes   Follow up appointments reviewed:  PCP Hospital f/u appt confirmed? No  Scheduled to see  on  @ . Specialist Hospital f/u appt confirmed? No  Scheduled to see  on  @ . Are transportation arrangements needed? No  If their condition worsens, is the pt aware to call PCP or go to the Emergency Dept.? Yes Was the patient provided with contact information for the PCP's office or ED? Yes Was to pt encouraged to call back with questions or concerns? Yes   Laban Orourke [facility]  [facility] Guide Direct Dial: [phone]  Fax: [phone] Website: [email or web address]

## 2024-01-11 ENCOUNTER — Ambulatory Visit: Payer: Medicare Other | Admitting: Podiatry

## 2024-01-11 DIAGNOSIS — G629 Polyneuropathy, unspecified: Secondary | ICD-10-CM | POA: Diagnosis not present

## 2024-01-11 DIAGNOSIS — F411 Generalized anxiety disorder: Secondary | ICD-10-CM | POA: Diagnosis not present

## 2024-01-11 DIAGNOSIS — R42 Dizziness and giddiness: Secondary | ICD-10-CM | POA: Diagnosis not present

## 2024-01-11 DIAGNOSIS — R531 Weakness: Secondary | ICD-10-CM | POA: Diagnosis not present

## 2024-01-13 ENCOUNTER — Other Ambulatory Visit: Payer: Self-pay | Admitting: Physician Assistant

## 2024-01-13 DIAGNOSIS — G912 (Idiopathic) normal pressure hydrocephalus: Secondary | ICD-10-CM

## 2024-01-18 ENCOUNTER — Ambulatory Visit
Admission: RE | Admit: 2024-01-18 | Discharge: 2024-01-18 | Disposition: A | Discharge status: Home / Self Care | Source: Ambulatory Visit | Attending: Physician Assistant | Admitting: Physician Assistant

## 2024-01-18 DIAGNOSIS — G912 (Idiopathic) normal pressure hydrocephalus: Secondary | ICD-10-CM | POA: Insufficient documentation

## 2024-01-25 ENCOUNTER — Ambulatory Visit: Payer: Medicare Other | Admitting: Podiatry

## 2024-01-25 ENCOUNTER — Encounter: Payer: Self-pay | Admitting: Podiatry

## 2024-01-25 DIAGNOSIS — B351 Tinea unguium: Secondary | ICD-10-CM | POA: Diagnosis not present

## 2024-01-25 DIAGNOSIS — M79675 Pain in left toe(s): Secondary | ICD-10-CM | POA: Diagnosis not present

## 2024-01-25 DIAGNOSIS — M79674 Pain in right toe(s): Secondary | ICD-10-CM

## 2024-01-25 NOTE — Progress Notes (Signed)
  Subjective:  Patient ID: Rebecca Faulkner, female    DOB: [birth year],  MRN: [id number]  Chief Complaint  Patient presents with   Nail Problem    RM6: 3 m f/u thick fungal nails    83 y.o. female presents with the above complaint. History confirmed with patient.  No new issues debridement has been helpful  Objective:  Physical Exam: warm, good capillary refill, no trophic changes or ulcerative lesions, normal DP and PT pulses, and normal sensory exam. Left Foot: dystrophic yellowed discolored nail plates with subungual debris Right Foot: dystrophic yellowed discolored nail plates with subungual debris  Assessment:   1. Pain due to onychomycosis of toenails of both feet      Plan:  Patient was evaluated and treated and all questions answered.  Discussed the etiology and treatment options for the condition in detail with the patient. Recommended debridement of the nails today. Sharp and mechanical debridement performed of all painful and mycotic nails today. Nails debrided in length and thickness using a nail nipper to level of comfort. Discussed treatment options including appropriate shoe gear.   Continue regular intermittent debridement has been helpful in reducing pain and improving function.     Return in about 3 months (around 04/25/2024) for painful thick fungal nails.

## 2024-02-04 ENCOUNTER — Encounter: Payer: Self-pay | Admitting: Hematology and Oncology

## 2024-02-08 ENCOUNTER — Ambulatory Visit: Attending: Neurology

## 2024-02-08 DIAGNOSIS — R2681 Unsteadiness on feet: Secondary | ICD-10-CM | POA: Diagnosis not present

## 2024-02-08 DIAGNOSIS — R262 Difficulty in walking, not elsewhere classified: Secondary | ICD-10-CM | POA: Insufficient documentation

## 2024-02-08 DIAGNOSIS — M6281 Muscle weakness (generalized): Secondary | ICD-10-CM | POA: Insufficient documentation

## 2024-02-08 DIAGNOSIS — R42 Dizziness and giddiness: Secondary | ICD-10-CM | POA: Insufficient documentation

## 2024-02-08 NOTE — Therapy (Signed)
 OUTPATIENT PHYSICAL THERAPY NEURO EVALUATION   Patient Name: Rebecca Faulkner MRN: [id number] DOB:April 23, 1941, 83 y.o., female Today's Date: 02/08/2024   PCP: Rockney Cid, DO REFERRING PROVIDER: Bufford Carne, MD  END OF SESSION:  PT End of Session - 02/08/24 1135     Visit Number 1    Number of Visits 25    Date for PT Re-Evaluation 05/02/24    PT Start Time 0805    PT Stop Time 0845    PT Time Calculation (min) 40 min    Equipment Utilized During Treatment Gait belt    Activity Tolerance Patient tolerated treatment well;No increased pain    Behavior During Therapy [facility] for tasks assessed/performed             Past Medical History:  Diagnosis Date   Breast cancer (HCC) 2013   right breast   Heart murmur 1990   Hypercholesteremia    Lump or mass in breast    Malignant neoplasm of upper-outer quadrant of female breast (HCC) 2013   R-breast T1, NO, MO, ER/PR pos., Her 2 neg.   Osteoporosis    Ulcer 1974   Past Surgical History:  Procedure Laterality Date   BREAST SURGERY Right 2013   CATARACT EXTRACTION W/PHACO Right 04/23/2016   Procedure: CATARACT EXTRACTION PHACO AND INTRAOCULAR LENS PLACEMENT (IOC) RIGHT EYE;  Surgeon: Annell Kidney, MD;  Location: [facility];  Service: Ophthalmology;  Laterality: Right;   CATARACT EXTRACTION W/PHACO Left 06/04/2016   Procedure: CATARACT EXTRACTION PHACO AND INTRAOCULAR LENS PLACEMENT (IOC);  Surgeon: Annell Kidney, MD;  Location: [facility];  Service: Ophthalmology;  Laterality: Left;   CESAREAN SECTION  1969   COLONOSCOPY WITH PROPOFOL N/A 07/22/2017   Procedure: COLONOSCOPY WITH PROPOFOL;  Surgeon: Marshall Skeeter, MD;  Location: [facility];  Service: Endoscopy;  Laterality: N/A;   FRACTURE SURGERY Left 2011   arm, plate and 12 screws   LEG SURGERY Right 01/02/2015   MASTECTOMY Right 2013   Right inguinal hernia repair  2013   VEIN SURGERY Left 2015   Patient Active Problem List    Diagnosis Date Noted   History of diverticulitis 03/12/2020   Left ovarian cyst 03/12/2020   Atherosclerosis of aorta (HCC) 03/12/2020   Osteoporosis 08/05/2019   Closed comminuted intertrochanteric fracture of proximal femur (HCC) 07/27/2018   Mass of upper outer quadrant of left breast 06/01/2018   Goals of care, counseling/discussion 01/31/2018   Osteoporosis without current pathological fracture 07/28/2017   Depression, major, recurrent, mild (HCC) 07/07/2017   B12 deficiency 07/07/2017   Vitamin D deficiency 07/07/2017   Screening for colon cancer 05/27/2017   Malignant neoplasm of upper-outer quadrant of right breast in female, estrogen receptor positive (HCC) 05/27/2017   Glaucoma 05/26/2017   History of alcoholism (HCC) 05/26/2017   History of hip fracture 05/10/2015   Breast cancer, right (HCC) 05/27/2012   Estrogen receptor positive status (ER+) 05/14/2012    ONSET DATE: 5 years ago  REFERRING DIAG:  R26.89 (ICD-10-CM) - Imbalance  G62.9 (ICD-10-CM) - Neuropathy    THERAPY DIAG:  Unsteadiness on feet  Dizziness and giddiness  Muscle weakness (generalized)  Difficulty in walking, not elsewhere classified  Rationale for Evaluation and Treatment: Rehabilitation  SUBJECTIVE:  SUBJECTIVE STATEMENT: Pt is a pleasant 83 y/o female presenting to PT for imbalance. Pt ambulating with a SPC. She states she initially thought she had vertigo when she started losing her balance where onset of imbalance was about 5 years ago. Pt noticed imbalance with carrying tea while ambulating; she felt she was going to fall/had someone help her. Pt reports she still gets dizzy off and on. She describes it as "weak" and "sometimes the room moves." When she had an MRI recently she saw the room spin. Pt  reports no falls in the last six months, but she has had near-falls where she grabs onto stuff.  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 She has neuropathy affecting bilat LE.  Pt accompanied by: friend   PERTINENT HISTORY: Per chart PMH significant for breast CA (R), hx hip fx, hx alcoholism, depression, B12 deficiency, Vitamin D deficiency, osteoporosis without current pathological fx, mass of upper quadrant L breast, closed comminuted intertrochanteric fx of proximal femur, atherosclerosis of aorta  PAIN:  Are you having pain?  General arthritis pain; she reports she takes Advil to help her pain  PRECAUTIONS: Fall  RED FLAGS: Pt states she wears a diaper due to incontinence and says physician aware    WEIGHT BEARING RESTRICTIONS: No  FALLS: Has patient fallen in last 6 months? No  LIVING ENVIRONMENT: Lives with: lives alone Lives in: House/apartment Stairs:  7 steps to get into home, bilat rails Has following equipment at home: Single point cane, Grab bars, and grab bar in tub but does not use anymore  PLOF:  indep with ADLs at home but is getting some help at work  PATIENT GOALS: she wants to walk without a cane; I don't want to end up in a wheelchair   OBJECTIVE:  Note: Objective measures were completed at Evaluation unless otherwise noted.  DIAGNOSTIC FINDINGS:  MRI brain pending results  CT HEAD 10/20/22: "FINDINGS: Brain: No acute infarct or hemorrhage. Lateral ventricles and midline structures are stable. No acute extra-axial fluid collections. No mass effect.   Vascular: No hyperdense vessel or unexpected calcification.   Skull: Normal. Negative for fracture or focal lesion.   Sinuses/Orbits: Mild mucoperiosteal thickening throughout the ethmoid air cells. Remaining paranasal sinuses are clear.   Other: None.   IMPRESSION: 1. Ethmoid sinus disease. 2. No acute intracranial  process.     Electronically Signed   By: Sharlet Salina M.D.   On: 10/20/2022 16:14"  COGNITION: Overall cognitive status: Within functional limits for tasks assessed   SENSATION: Pt has neuropathy affecting bilat LE and RUE; she describes sensation as numbness  COORDINATION: WFL BLE and BUE with rapid alt movement, chin<>target; shin>contrlat LE   EDEMA:  She reports every now and then she gets some swelling in BLE has been ongoing 4 years /not new   POSTURE: rounded shoulders, increased thoracic kyphosis    LOWER EXTREMITY MMT:   Grossly 4/5 bilat LE most deficits found proximal musculature   BED MOBILITY:  Rolling makes pt lightheaded   TRANSFERS: Able to complete hands-free in session with SBA  RAMP:  impaired confidence in balance on ramps per ABC questionnaire    STAIRS:  impaired confidence in balance on stairs per ABC questionnaire   GAIT: See below: decreased gait speed, uses SPC, close CGA without AD, shuffling-like gait/decreased step length and decreased heel strike, limited arm-swing and hip  extension bilat, L lateral sway in mid-stance  FUNCTIONAL TESTS:  5 times sit to stand: 13 sec, hands-free  Timed up and go (TUG): 19,5 sec no AD; 14 sec with SPC  10 meter walk test: 0.61 m/s with [facility] Berg Balance Scale: deferred    PATIENT SURVEYS:  ABC scale 58.75%                                                                                                                              TREATMENT DATE:  NMR:  Review of assessment findings, what this means for pt's current functional status, prognosis, and plan based on findings for next visit (plan to test Dix-Hallpike, Roll test). Additionally PT recommends to bring a friend next visit as well in order to pursue positional testing to screen inner ear problem.    PATIENT EDUCATION: Education details: assessment findings, plan, goals Person educated: Patient and pt's friend present for eval   Education method: Explanation Education comprehension: verbalized understanding  HOME EXERCISE PROGRAM: To be initiated next 1-2 visits  GOALS: Goals reviewed with patient? Yes  SHORT TERM GOALS: Target date: 03/21/2024    Patient will be independent in home exercise program to improve strength/mobility for better functional independence with ADLs. Baseline: to be initiated next 1-2 visit Goal status: INITIAL   LONG TERM GOALS: Target date: 05/02/2024    Patient will increase ABC scale score >80% to demonstrate better functional mobility and better confidence with ADLs.  Baseline: 58.75% Goal status: INITIAL  2.  Patient will complete five times sit to stand test in < 10 seconds indicating an increased LE strength and improved balance. Baseline: 13 sec Goal status: INITIAL  3.  Patient will increase Berg Balance score by > 6 points to demonstrate decreased fall risk during functional activities Baseline:  Goal status: INITIAL  4.  Patient will increase 10 meter walk test to >1.60m/s as to improve gait speed for better community ambulation and to reduce fall risk. Baseline: 0.61 m/s with SPC Goal status: INITIAL  5.  Patient will reduce timed up and go to <11 seconds to reduce fall risk and demonstrate improved transfer/gait ability. Baseline: 19.5 sec no AD, 14 sec with SPC Goal status: INITIAL    ASSESSMENT:  CLINICAL IMPRESSION: Patient is a pleasant 83 y.o. female who was seen today for physical therapy evaluation and treatment for imbalance and neuropathy. Pt also with reports of spinning/vertigo and hx of near-falls. PT plans to screen for inner-ear impairment next 1-2 visits. Eval findings also indicate impaired gait, balance, strength, sensation, balance confidence, pain and mobility. The pt will benefit from further skilled PT to improve impairments in order to decrease fall risk, and increase QOL and ease and safety with ADLs.   OBJECTIVE IMPAIRMENTS: Abnormal  gait, decreased activity tolerance, decreased balance, decreased mobility, difficulty walking, decreased ROM, decreased strength, dizziness, impaired sensation, impaired UE functional use, improper body mechanics, postural dysfunction, and pain.   ACTIVITY  LIMITATIONS: carrying, lifting, bending, standing, squatting, stairs, transfers, bed mobility, bathing, and locomotion level  PARTICIPATION LIMITATIONS: meal prep, cleaning, shopping, community activity, occupation, and yard work  PERSONAL FACTORS: Age, Fitness, Sex, Time since onset of injury/illness/exacerbation, and 3+ comorbidities: Per chart PMH significant for breast CA (R), hx hip fx, hx alcoholism, depression, B12 deficiency, Vitamin D deficiency, osteoporosis without current pathological fx, mass of upper quadrant L breast, closed comminuted intertrochanteric fx of proximal femur, atherosclerosis of aorta  are also affecting patient's functional outcome.   REHAB POTENTIAL: Good  CLINICAL DECISION MAKING: Evolving/moderate complexity  EVALUATION COMPLEXITY: Moderate  PLAN:  PT FREQUENCY: 1-2x/week  PT DURATION: 12 weeks  PLANNED INTERVENTIONS: 97164- PT Re-evaluation, 97750- Physical Performance Testing, 97110-Therapeutic exercises, 97530- Therapeutic activity, [id number]- Neuromuscular re-education, 97535- Self Care, 66440- Manual therapy, [id number]- Gait training, [phone]- Orthotic Initial, [phone]- Orthotic/Prosthetic subsequent, [phone]- Canalith repositioning, [phone]- Splinting, [phone]- Electrical stimulation (manual), Patient/Family education, Balance training, Stair training, Taping, Joint mobilization, Spinal mobilization, Vestibular training, DME instructions, Wheelchair mobility training, Cryotherapy, and Moist heat  PLAN FOR NEXT SESSION: Dix-Hallpike and Roll test, complete other testing as time permits, start HEP next 1-2 visits   Samie Crews, PT 02/08/2024, 11:35 AM

## 2024-02-15 ENCOUNTER — Ambulatory Visit (INDEPENDENT_AMBULATORY_CARE_PROVIDER_SITE_OTHER): Admitting: Internal Medicine

## 2024-02-15 ENCOUNTER — Ambulatory Visit: Payer: Self-pay | Admitting: Internal Medicine

## 2024-02-15 VITALS — BP 126/86 | HR 91 | Resp 18 | Ht 62.0 in | Wt 149.8 lb

## 2024-02-15 DIAGNOSIS — E782 Mixed hyperlipidemia: Secondary | ICD-10-CM

## 2024-02-15 DIAGNOSIS — R930 Abnormal findings on diagnostic imaging of skull and head, not elsewhere classified: Secondary | ICD-10-CM

## 2024-02-15 MED ORDER — SIMVASTATIN 40 MG PO TABS: 40.0000 mg | ORAL_TABLET | Freq: Every day | ORAL | 1 refills | Status: DC

## 2024-02-15 NOTE — Progress Notes (Signed)
 Established Patient Office Visit  Subjective:     Patient ID: Rebecca Faulkner, female    DOB: [birth year], 83 y.o.   MRN: [id number]  Chief Complaint  Patient presents with   Hyperlipidemia   Prediabetes    Hyperlipidemia   Patient is in today for follow up on chronic medical conditions.  She is here with her friend today. They would also like to discuss results of recent MRI.   Neuropathy: -CT of the head was obtained on 04/21/2022, which was negative for acute abnormalities.   -Laboratory work-up at the time of symptom onset significant only for low vitamin B1 levels, she is currently on a B complex supplement.   -She cannot undergo an MRI due to metal plates in her left humerus and right femur.   -EMG on 09/22/22 showing chronic severe polyneuropathy.   -Currently on gabapentin 200 mg nightly but feels like neuropathy symptoms are worse and affecting her at her job -She is following with neurology at [facility] -MRI head 4/25 showing mild ventriculometry, concerns for potential normal pressure hydrocephalus, chronic small vessel disease.   HLD: -Medications: Zocor  40 mg -Patient is compliant with above medications and reports no side effects. Doing well.  -Last lipid panel: Lipid Panel     Component Value Date/Time   CHOL 209 (H) 11/10/2022 1034   TRIG 94 11/10/2022 1034   HDL 64 11/10/2022 1034   CHOLHDL 3.3 11/10/2022 1034   LDLCALC 125 (H) 11/10/2022 1034   The ASCVD Risk score (Arnett DK, et al., 2019) failed to calculate for the following reasons:   The 2019 ASCVD risk score is only valid for ages 52 to 31  Hx of Pre-Diabetes: -Last A1c 9/24 5.5% -Not currently on any medication, does eat chocolate and sweets frequently   Health maintenance: -Blood work up-to-date -Colon cancer screening: Colonoscopy 2018   Review of Systems  All other systems reviewed and are negative.       Objective:    BP 126/86   Pulse 91   Resp 18   Ht 5\' 2"  (1.575 m)    Wt 149 lb 12.8 oz (67.9 kg)   SpO2 96%   BMI 27.40 kg/m  BP Readings from Last 3 Encounters:  02/15/24 126/86  11/13/23 114/72  08/17/23 124/74   Wt Readings from Last 3 Encounters:  02/15/24 149 lb 12.8 oz (67.9 kg)  11/13/23 148 lb (67.1 kg)  08/17/23 148 lb 11.2 oz (67.4 kg)      Physical Exam Constitutional:      Appearance: Normal appearance.  HENT:     Head: Normocephalic and atraumatic.  Eyes:     Conjunctiva/sclera: Conjunctivae normal.  Cardiovascular:     Rate and Rhythm: Normal rate and regular rhythm.  Pulmonary:     Effort: Pulmonary effort is normal.     Breath sounds: Normal breath sounds.  Feet:     Right foot:     Skin integrity: Skin integrity normal.     Left foot:     Skin integrity: Skin integrity normal.  Skin:    General: Skin is warm and dry.  Neurological:     General: No focal deficit present.     Mental Status: She is alert. Mental status is at baseline.  Psychiatric:        Mood and Affect: Mood normal.        Behavior: Behavior normal.     No results found for any visits on 02/15/24.  Assessment & Plan:   Assessment and Plan Assessment & Plan Mild ventriculomegaly MRI shows mild ventriculomegaly, suspect normal pressure hydrocephalus. Symptoms include balance issues, memory changes, and urinary incontinence. Referral to [facility] specialist recommended for further evaluation and potential treatment. - Provided educational material on normal pressure hydrocephalus. - Advised contacting Doctor Potter's office for referral to [facility] specialist.  Chronic small vessel disease Chronic small vessel changes on MRI consistent with cholesterol plaque buildup. No acute stroke or hemorrhage. - Continue simvastatin  (Zocor ) for cholesterol management.  Hyperlipidemia Chronic small vessel changes likely due to cholesterol plaque buildup. Managed with simvastatin . - Continue simvastatin  (Zocor ) for cholesterol management. - Send simvastatin   prescription to [facility].  Anxiety Anxiety related to MRI findings and potential normal pressure hydrocephalus diagnosis. Reassured about non-acute nature and plan for further evaluation.  - simvastatin  (ZOCOR ) 40 MG tablet; Take 1 tablet (40 mg total) by mouth at bedtime.  Dispense: 90 tablet; Refill: 1   Return in about 6 months (around 08/16/2024).  Rockney Cid, DO

## 2024-02-22 ENCOUNTER — Ambulatory Visit: Admitting: Physical Therapy

## 2024-02-22 DIAGNOSIS — M6281 Muscle weakness (generalized): Secondary | ICD-10-CM | POA: Diagnosis not present

## 2024-02-22 DIAGNOSIS — R2681 Unsteadiness on feet: Secondary | ICD-10-CM

## 2024-02-22 DIAGNOSIS — R42 Dizziness and giddiness: Secondary | ICD-10-CM | POA: Diagnosis not present

## 2024-02-22 DIAGNOSIS — R262 Difficulty in walking, not elsewhere classified: Secondary | ICD-10-CM | POA: Diagnosis not present

## 2024-02-22 NOTE — Therapy (Signed)
 OUTPATIENT PHYSICAL THERAPY NEURO TREATMENT   Patient Name: Rebecca Faulkner MRN: [id number] DOB:[birth year], 83 y.o., female Today's Date: 02/22/2024   PCP: Rockney Cid, DO REFERRING PROVIDER: Bufford Carne, MD  END OF SESSION:  PT End of Session - 02/22/24 0847     Visit Number 2    Number of Visits 25    Date for PT Re-Evaluation 05/02/24    PT Start Time 0847    PT Stop Time 0930    PT Time Calculation (min) 43 min    Equipment Utilized During Treatment Gait belt    Activity Tolerance Patient tolerated treatment well;No increased pain    Behavior During Therapy [facility] for tasks assessed/performed              Past Medical History:  Diagnosis Date   Breast cancer (HCC) 2013   right breast   Heart murmur 1990   Hypercholesteremia    Lump or mass in breast    Malignant neoplasm of upper-outer quadrant of female breast (HCC) 2013   R-breast T1, NO, MO, ER/PR pos., Her 2 neg.   Osteoporosis    Ulcer 1974   Past Surgical History:  Procedure Laterality Date   BREAST SURGERY Right 2013   CATARACT EXTRACTION W/PHACO Right 04/23/2016   Procedure: CATARACT EXTRACTION PHACO AND INTRAOCULAR LENS PLACEMENT (IOC) RIGHT EYE;  Surgeon: Annell Kidney, MD;  [facility];  Service: Ophthalmology;  Laterality: Right;   CATARACT EXTRACTION W/PHACO Left 06/04/2016   Procedure: CATARACT EXTRACTION PHACO AND INTRAOCULAR LENS PLACEMENT (IOC);  Surgeon: Annell Kidney, MD;  Location: [facility];  Service: Ophthalmology;  Laterality: Left;   CESAREAN SECTION  1969   COLONOSCOPY WITH PROPOFOL  N/A 07/22/2017   Procedure: COLONOSCOPY WITH PROPOFOL ;  Surgeon: Marshall Skeeter, MD;  Location: [facility];  Service: Endoscopy;  Laterality: N/A;   FRACTURE SURGERY Left 2011   arm, plate and 12 screws   LEG SURGERY Right 01/02/2015   MASTECTOMY Right 2013   Right inguinal hernia repair  2013   VEIN SURGERY Left 2015   Patient Active Problem List    Diagnosis Date Noted   History of diverticulitis 03/12/2020   Left ovarian cyst 03/12/2020   Atherosclerosis of aorta (HCC) 03/12/2020   Osteoporosis 08/05/2019   Closed comminuted intertrochanteric fracture of proximal femur (HCC) 07/27/2018   Mass of upper outer quadrant of left breast 06/01/2018   Goals of care, counseling/discussion 01/31/2018   Osteoporosis without current pathological fracture 07/28/2017   Depression, major, recurrent, mild (HCC) 07/07/2017   B12 deficiency 07/07/2017   Vitamin D  deficiency 07/07/2017   Screening for colon cancer 05/27/2017   Malignant neoplasm of upper-outer quadrant of right breast in female, estrogen receptor positive (HCC) 05/27/2017   Glaucoma 05/26/2017   History of alcoholism (HCC) 05/26/2017   History of hip fracture 05/10/2015   Breast cancer, right (HCC) 05/27/2012   Estrogen receptor positive status (ER+) 05/14/2012    ONSET DATE: 5 years ago  REFERRING DIAG:  R26.89 (ICD-10-CM) - Imbalance  G62.9 (ICD-10-CM) - Neuropathy    THERAPY DIAG:  Unsteadiness on feet  Dizziness and giddiness  Muscle weakness (generalized)  Difficulty in walking, not elsewhere classified  Rationale for Evaluation and Treatment: Rehabilitation  SUBJECTIVE:  SUBJECTIVE STATEMENT:  Pt reports recently finding out she has normal pressure hydrocephalus and has been referred to [facility] specialist, but may have to go to local MD due to them not being unavailable until August. Patient reports she has "safe zones" where she feels safe and comfortable ambulating including her home and behind her desk at work; however, states she feels very unsafe out in the community such as ambulating in the clinic today. Pt reports always using her hurricane in places she doesn't feel  safe.  Initial Eval: Pt is a pleasant 83 y/o female presenting to PT for imbalance. Pt ambulating with a SPC. She states she initially thought she had vertigo when she started losing her balance where onset of imbalance was about 5 years ago. Pt noticed imbalance with carrying tea while ambulating; she felt she was going to fall/had someone help her. Pt reports she still gets dizzy off and on. She describes it as "weak" and "sometimes the room moves." When she had an MRI recently she saw the room spin. Pt reports no falls in the last six months, but she has had near-falls where she grabs onto stuff.  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 She has neuropathy affecting bilat LE.  Pt accompanied by: friend Counsellor)  PERTINENT HISTORY: Per chart PMH significant for breast CA (R), hx hip fx, hx alcoholism, depression, B12 deficiency, Vitamin D  deficiency, osteoporosis without current pathological fx, mass of upper quadrant L breast, closed comminuted intertrochanteric fx of proximal femur, atherosclerosis of aorta  PAIN:  Are you having pain?  General arthritis pain; she reports she takes Advil to help her pain  PRECAUTIONS: Fall  RED FLAGS: Pt states she wears a diaper due to incontinence and says physician aware    WEIGHT BEARING RESTRICTIONS: No  FALLS: Has patient fallen in last 6 months? No  LIVING ENVIRONMENT: Lives with: lives alone Lives in: House/apartment Stairs:  7 steps to get into home, bilat rails Has following equipment at home: Single point cane, Grab bars, and grab bar in tub but does not use anymore  PLOF:  indep with ADLs at home but is getting some help at work  PATIENT GOALS: she wants to walk without a cane; I don't want to end up in a wheelchair   OBJECTIVE:  Note: Objective measures were completed at Evaluation unless otherwise noted.  DIAGNOSTIC FINDINGS:  EXAM 01/18/2024: MRI HEAD  WITHOUT CONTRAST IMPRESSION: 1. Mild ventriculomegaly with narrowing of the callosal angle. Findings can be seen with normal pressure hydrocephalus in the appropriate clinical setting. 2. Chronic small vessel changes. 3. No acute infarct.     Electronically Signed   By: Johnanna Mylar M.D.   On: 02/10/2024 11:19  CT HEAD 10/20/22: "FINDINGS: Brain: No acute infarct or hemorrhage. Lateral ventricles and midline structures are stable. No acute extra-axial fluid collections. No mass effect.   Vascular: No hyperdense vessel or unexpected calcification.   Skull: Normal. Negative for fracture or focal lesion.   Sinuses/Orbits: Mild mucoperiosteal thickening throughout the ethmoid air cells. Remaining paranasal sinuses are clear.   Other: None.   IMPRESSION: 1. Ethmoid sinus disease. 2. No acute intracranial process.     Electronically Signed   By: Bobbye Burrow M.D.   On: 10/20/2022 16:14"  COGNITION: Overall cognitive status: Within functional limits for tasks assessed   SENSATION: Pt has neuropathy affecting bilat LE and  RUE; she describes sensation as numbness  COORDINATION: WFL BLE and BUE with rapid alt movement, chin<>target; shin>contrlat LE   EDEMA:  She reports every now and then she gets some swelling in BLE has been ongoing 4 years /not new   POSTURE: rounded shoulders, increased thoracic kyphosis    LOWER EXTREMITY MMT:   Grossly 4/5 bilat LE most deficits found proximal musculature   BED MOBILITY:  Rolling makes pt lightheaded   TRANSFERS: Able to complete hands-free in session with SBA  RAMP:  impaired confidence in balance on ramps per ABC questionnaire    STAIRS:  impaired confidence in balance on stairs per ABC questionnaire   GAIT: See below: decreased gait speed, uses SPC, close CGA without AD, shuffling-like gait/decreased step length and decreased heel strike, limited arm-swing and hip extension bilat, L lateral sway in  mid-stance  FUNCTIONAL TESTS:  5 times sit to stand: 13 sec, hands-free  Timed up and go (TUG): 19,5 sec no AD; 14 sec with SPC  10 meter walk test: 0.61 m/s with [facility] Berg Balance Scale: deferred    PATIENT SURVEYS:  ABC scale 58.75%  Vestibular Assessment from 02/22/2024:  OCULOMOTOR EXAM:  Ocular Alignment: normal, some skin drooping around R eye impacting alignment slightly, but relatively symmetrical  Ocular ROM: No Limitations  Spontaneous Nystagmus: absent  Gaze-Induced Nystagmus: absent  Smooth Pursuits: intact  Saccades: slow  Convergence/Divergence: appears normal  Cover/Cross-Cover: WNL  VESTIBULAR - OCULAR REFLEX:   Slow VOR: Normal  VOR Cancellation: Normal, when having pt perform it herself she moved her head more quickly and she said she felt "a little lightheadedness"  Head-Impulse Test: unable to test due to pt guarding  Dynamic Visual Acuity:  to be tested if needed  POSITIONAL TESTING: Right Dix-Hallpike: upbeating, right nystagmus and Duration: <30 seconds Right Sidelying: symptomatic upon return to sitting upright Left Sidelying: no nystagmus and no symptoms  MOTION SENSITIVITY: to be tested if needed  Motion Sensitivity Quotient                                                                                                                              TREATMENT DATE: 02/22/2024  Most recent MRI results added above.    Pt ambulates into clinic using hurricane with very short, shuffled gait and guarded upper body posturing along with very slow gait speed. Close SBA/CGA provided for patient safety.  Performed Vestibular assessment as described above.   Positive R Dix-Hallpike - performed CRM x2 repetitions with good response from patient  Provided pt with ANPT vestibular education hand-outs on BPPV and What to do after BPPV Repositioning.  Pt appreciative of education on vestibular system and treatment today.   PATIENT EDUCATION: Education  details: assessment findings, plan, goals Person educated: Patient and pt's friend present for eval  Education method: Explanation Education comprehension: verbalized understanding  HOME EXERCISE PROGRAM: To be initiated next 1-2 visits  GOALS: Goals reviewed with patient?  Yes  SHORT TERM GOALS: Target date: 03/21/2024    Patient will be independent in home exercise program to improve strength/mobility for better functional independence with ADLs. Baseline: to be initiated next 1-2 visit Goal status: INITIAL   LONG TERM GOALS: Target date: 05/02/2024    Patient will increase ABC scale score >80% to demonstrate better functional mobility and better confidence with ADLs.  Baseline: 58.75% Goal status: INITIAL  2.  Patient will complete five times sit to stand test in < 10 seconds indicating an increased LE strength and improved balance. Baseline: 13 sec Goal status: INITIAL  3.  Patient will increase Berg Balance score by > 6 points to demonstrate decreased fall risk during functional activities Baseline:  Goal status: INITIAL  4.  Patient will increase 10 meter walk test to >1.32m/s as to improve gait speed for better community ambulation and to reduce fall risk. Baseline: 0.61 m/s with SPC Goal status: INITIAL  5.  Patient will reduce timed up and go to <11 seconds to reduce fall risk and demonstrate improved transfer/gait ability. Baseline: 19.5 sec no AD, 14 sec with SPC Goal status: INITIAL    ASSESSMENT:  CLINICAL IMPRESSION:  Patient is a pleasant 83 y.o. female who was seen today for physical therapy treatment for imbalance and neuropathy. Therapy session focused on vestibular assessment with oculomotor exam being WNL; however, patient positive for R upward beating, torsional nystagmus during R Dix-Hallpike. Performed 2x Canalith Repositioning Maneuver (CRM) for R posterior canal BPPV with pt tolerating treatment well. Provided education on testing and treatment  performed today with hand-out provided. The pt will benefit from further skilled PT to improve impairments in order to decrease fall risk, and increase QOL and ease and safety with ADLs.   OBJECTIVE IMPAIRMENTS: Abnormal gait, decreased activity tolerance, decreased balance, decreased mobility, difficulty walking, decreased ROM, decreased strength, dizziness, impaired sensation, impaired UE functional use, improper body mechanics, postural dysfunction, and pain.   ACTIVITY LIMITATIONS: carrying, lifting, bending, standing, squatting, stairs, transfers, bed mobility, bathing, and locomotion level  PARTICIPATION LIMITATIONS: meal prep, cleaning, shopping, community activity, occupation, and yard work  PERSONAL FACTORS: Age, Fitness, Sex, Time since onset of injury/illness/exacerbation, and 3+ comorbidities: Per chart PMH significant for breast CA (R), hx hip fx, hx alcoholism, depression, B12 deficiency, Vitamin D  deficiency, osteoporosis without current pathological fx, mass of upper quadrant L breast, closed comminuted intertrochanteric fx of proximal femur, atherosclerosis of aorta  are also affecting patient's functional outcome.   REHAB POTENTIAL: Good  CLINICAL DECISION MAKING: Evolving/moderate complexity  EVALUATION COMPLEXITY: Moderate  PLAN:  PT FREQUENCY: 1-2x/week  PT DURATION: 12 weeks  PLANNED INTERVENTIONS: 97164- PT Re-evaluation, 97750- Physical Performance Testing, 97110-Therapeutic exercises, 97530- Therapeutic activity, [id number]- Neuromuscular re-education, 97535- Self Care, 16109- Manual therapy, [id number]- Gait training, [phone]- Orthotic Initial, [phone]- Orthotic/Prosthetic subsequent, [phone]- Canalith repositioning, [phone]- Splinting, [phone]- Electrical stimulation (manual), Patient/Family education, Balance training, Stair training, Taping, Joint mobilization, Spinal mobilization, Vestibular training, DME instructions, Wheelchair mobility training, Cryotherapy, and Moist heat  PLAN  FOR NEXT SESSION:  - re-test R Dix-Hallpike - perform Roll test - start HEP next 1-2 visits - introduce rollator as other AD option in community   Robbins, PT, DPT, NCS, CSRS Physical Therapist - [place]  [facility]  9:30 AM 02/22/24

## 2024-02-29 ENCOUNTER — Ambulatory Visit: Attending: Neurology | Admitting: Physical Therapy

## 2024-02-29 DIAGNOSIS — R2681 Unsteadiness on feet: Secondary | ICD-10-CM | POA: Diagnosis not present

## 2024-02-29 DIAGNOSIS — M6281 Muscle weakness (generalized): Secondary | ICD-10-CM | POA: Insufficient documentation

## 2024-02-29 DIAGNOSIS — R262 Difficulty in walking, not elsewhere classified: Secondary | ICD-10-CM | POA: Diagnosis not present

## 2024-02-29 DIAGNOSIS — R42 Dizziness and giddiness: Secondary | ICD-10-CM | POA: Diagnosis not present

## 2024-02-29 NOTE — Therapy (Signed)
 OUTPATIENT PHYSICAL THERAPY NEURO TREATMENT   Patient Name: Rebecca Faulkner MRN: [id number] DOB:Jul 20, 1941, 83 y.o., female Today's Date: 02/29/2024   PCP: Rockney Cid, DO REFERRING PROVIDER: Bufford Carne, MD  END OF SESSION:   PT End of Session - 02/29/24 1017     Visit Number 3    Number of Visits 25    Date for PT Re-Evaluation 05/02/24    PT Start Time 1018    PT Stop Time 1103    PT Time Calculation (min) 45 min    Equipment Utilized During Treatment Gait belt    Activity Tolerance Patient tolerated treatment well;No increased pain    Behavior During Therapy [facility] for tasks assessed/performed               Past Medical History:  Diagnosis Date   Breast cancer (HCC) 2013   right breast   Heart murmur 1990   Hypercholesteremia    Lump or mass in breast    Malignant neoplasm of upper-outer quadrant of female breast (HCC) 2013   R-breast T1, NO, MO, ER/PR pos., Her 2 neg.   Osteoporosis    Ulcer 1974   Past Surgical History:  Procedure Laterality Date   BREAST SURGERY Right 2013   CATARACT EXTRACTION W/PHACO Right 04/23/2016   Procedure: CATARACT EXTRACTION PHACO AND INTRAOCULAR LENS PLACEMENT (IOC) RIGHT EYE;  Surgeon: Annell Kidney, MD;  Location: [facility];  Service: Ophthalmology;  Laterality: Right;   CATARACT EXTRACTION W/PHACO Left 06/04/2016   Procedure: CATARACT EXTRACTION PHACO AND INTRAOCULAR LENS PLACEMENT (IOC);  Surgeon: Annell Kidney, MD;  Location: [facility];  Service: Ophthalmology;  Laterality: Left;   CESAREAN SECTION  1969   COLONOSCOPY WITH PROPOFOL  N/A 07/22/2017   Procedure: COLONOSCOPY WITH PROPOFOL ;  Surgeon: Marshall Skeeter, MD;  Location: [facility];  Service: Endoscopy;  Laterality: N/A;   FRACTURE SURGERY Left 2011   arm, plate and 12 screws   LEG SURGERY Right 01/02/2015   MASTECTOMY Right 2013   Right inguinal hernia repair  2013   VEIN SURGERY Left 2015   Patient Active Problem  List   Diagnosis Date Noted   History of diverticulitis 03/12/2020   Left ovarian cyst 03/12/2020   Atherosclerosis of aorta (HCC) 03/12/2020   Osteoporosis 08/05/2019   Closed comminuted intertrochanteric fracture of proximal femur (HCC) 07/27/2018   Mass of upper outer quadrant of left breast 06/01/2018   Goals of care, counseling/discussion 01/31/2018   Osteoporosis without current pathological fracture 07/28/2017   Depression, major, recurrent, mild (HCC) 07/07/2017   B12 deficiency 07/07/2017   Vitamin D  deficiency 07/07/2017   Screening for colon cancer 05/27/2017   Malignant neoplasm of upper-outer quadrant of right breast in female, estrogen receptor positive (HCC) 05/27/2017   Glaucoma 05/26/2017   History of alcoholism (HCC) 05/26/2017   History of hip fracture 05/10/2015   Breast cancer, right (HCC) 05/27/2012   Estrogen receptor positive status (ER+) 05/14/2012    ONSET DATE: 5 years ago  REFERRING DIAG:  R26.89 (ICD-10-CM) - Imbalance  G62.9 (ICD-10-CM) - Neuropathy    THERAPY DIAG:  Unsteadiness on feet  Dizziness and giddiness  Muscle weakness (generalized)  Difficulty in walking, not elsewhere classified  Rationale for Evaluation and Treatment: Rehabilitation  SUBJECTIVE:  SUBJECTIVE STATEMENT:  Pt states she has her "good times and bad times." Pt reports last Wednesday, sometime in the middle of the night, her "bed moved again" making her feel like she was going to fall out of the bed (experiencing vertigo). Pt states prior to that, it had been a long time since her last vertigo experience.   Pt states last Thursday afternoon at work, when she stood up to move, it was like all of a sudden she was "in a vacuum" (describing that it felt like she couldn't hear) and was  "lightheaded." States someone helped her stay safe at work until she was able to go home safely.  Pt reports she notices she has started "stuttering" more recently.    Initial Eval: Pt is a pleasant 83 y/o female presenting to PT for imbalance. Pt ambulating with a SPC. She states she initially thought she had vertigo when she started losing her balance where onset of imbalance was about 5 years ago. Pt noticed imbalance with carrying tea while ambulating; she felt she was going to fall/had someone help her. Pt reports she still gets dizzy off and on. She describes it as "weak" and "sometimes the room moves." When she had an MRI recently she saw the room spin. Pt reports no falls in the last six months, but she has had near-falls where she grabs onto stuff.  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 She has neuropathy affecting bilat LE.  Pt accompanied by: friend Counsellor)  PERTINENT HISTORY: Per chart PMH significant for breast CA (R), hx hip fx, hx alcoholism, depression, B12 deficiency, Vitamin D  deficiency, osteoporosis without current pathological fx, mass of upper quadrant L breast, closed comminuted intertrochanteric fx of proximal femur, atherosclerosis of aorta, new diagnosis of normal pressure hydrocephalus  PAIN:  Are you having pain?  General arthritis pain; she reports she takes Advil to help her pain  PRECAUTIONS: Fall, normal pressure hydrocephalus  RED FLAGS: Pt states she wears a diaper due to incontinence and says physician aware    WEIGHT BEARING RESTRICTIONS: No  FALLS: Has patient fallen in last 6 months? No  LIVING ENVIRONMENT: Lives with: lives alone Lives in: House/apartment Stairs:  7 steps to get into home, bilat rails Has following equipment at home: Single point cane, Grab bars, and grab bar in tub but does not use anymore  PLOF:  indep with ADLs at home but is getting some help at  work  PATIENT GOALS: she wants to walk without a cane; I don't want to end up in a wheelchair   OBJECTIVE:  Note: Objective measures were completed at Evaluation unless otherwise noted.  DIAGNOSTIC FINDINGS:  EXAM 01/18/2024: MRI HEAD WITHOUT CONTRAST IMPRESSION: 1. Mild ventriculomegaly with narrowing of the callosal angle. Findings can be seen with normal pressure hydrocephalus in the appropriate clinical setting. 2. Chronic small vessel changes. 3. No acute infarct.     Electronically Signed   By: Johnanna Mylar M.D.   On: 02/10/2024 11:19  CT HEAD 10/20/22: "FINDINGS: Brain: No acute infarct or hemorrhage. Lateral ventricles and midline structures are stable. No acute extra-axial fluid collections. No mass effect.   Vascular: No hyperdense vessel or unexpected calcification.   Skull: Normal. Negative for fracture or focal lesion.   Sinuses/Orbits: Mild mucoperiosteal thickening throughout the ethmoid air cells. Remaining paranasal sinuses are clear.   Other: None.   IMPRESSION:  1. Ethmoid sinus disease. 2. No acute intracranial process.     Electronically Signed   By: Bobbye Burrow M.D.   On: 10/20/2022 16:14"  COGNITION: Overall cognitive status: Within functional limits for tasks assessed   SENSATION: Pt has neuropathy affecting bilat LE and RUE; she describes sensation as numbness  COORDINATION: WFL BLE and BUE with rapid alt movement, chin<>target; shin>contrlat LE   EDEMA:  She reports every now and then she gets some swelling in BLE has been ongoing 4 years /not new   POSTURE: rounded shoulders, increased thoracic kyphosis    LOWER EXTREMITY MMT:   Grossly 4/5 bilat LE most deficits found proximal musculature   BED MOBILITY:  Rolling makes pt lightheaded   TRANSFERS: Able to complete hands-free in session with SBA  RAMP:  impaired confidence in balance on ramps per ABC questionnaire    STAIRS:  impaired confidence in balance on stairs  per ABC questionnaire   GAIT: See below: decreased gait speed, uses SPC, close CGA without AD, shuffling-like gait/decreased step length and decreased heel strike, limited arm-swing and hip extension bilat, L lateral sway in mid-stance  FUNCTIONAL TESTS:  5 times sit to stand: 13 sec, hands-free  Timed up and go (TUG): 19,5 sec no AD; 14 sec with SPC  10 meter walk test: 0.61 m/s with [facility] Berg Balance Scale: deferred    PATIENT SURVEYS:  ABC scale 58.75%  Vestibular Assessment from 02/22/2024:  OCULOMOTOR EXAM:  Ocular Alignment: normal, some skin drooping around R eye impacting alignment slightly, but relatively symmetrical  Ocular ROM: No Limitations  Spontaneous Nystagmus: absent  Gaze-Induced Nystagmus: absent  Smooth Pursuits: intact  Saccades: slow  Convergence/Divergence: appears normal  Cover/Cross-Cover: WNL  VESTIBULAR - OCULAR REFLEX:   Slow VOR: Normal  VOR Cancellation: Normal, when having pt perform it herself she moved her head more quickly and she said she felt "a little lightheadedness"  Head-Impulse Test: unable to test due to pt guarding  Dynamic Visual Acuity:  to be tested if needed  POSITIONAL TESTING: Right Dix-Hallpike: upbeating, right nystagmus and Duration: <30 seconds Right Sidelying: symptomatic upon return to sitting upright Left Sidelying: no nystagmus and no symptoms  MOTION SENSITIVITY: to be tested if needed  Motion Sensitivity Quotient                                                                                                                              TREATMENT DATE: 02/29/2024  Pt ambulates into clinic using hurricane with very short, shuffled gait and guarded upper body posturing along with very slow gait speed. Close SBA/CGA provided for patient safety.  Vestibular Testing: Loaded R Dix-Hallpike: Negative (no nystagmus and no symptoms)  Loaded L Dix-Hallpike: Negative (no nystagmus and no symptoms); however, pt did report  small amount of lightheadedness when sitting back up from testing in this position Negative Roll Test bilaterally   Vitals - Orthostatic testing: Supine for at least 3  minutes: BP 116/69 (MAP 83), HR 74bpm  Immediate transition into standing: BP 115/83 (MAP 94), HR 83bpm    Patient participated in [employer] and demonstrates increased fall risk as noted by score of   36/56. Pt with greatest difficulty with items including: turning 360, alternating foot taps, semi-tandem, reaching outside BOS, and SLS.  (<36= high risk for falls, close to 100%; 37-45 significant >80%; 46-51 moderate >50%; 52-55 lower >25%).   [facility] PT Assessment - 02/29/24 0001       Standardized Balance Assessment   Standardized Balance Assessment Berg Balance Test      Berg Balance Test   Sit to Stand Able to stand without using hands and stabilize independently    Standing Unsupported Able to stand safely 2 minutes    Sitting with Back Unsupported but Feet Supported on Floor or Stool Able to sit safely and securely 2 minutes    Stand to Sit Sits safely with minimal use of hands    Transfers Able to transfer with verbal cueing and /or supervision    Standing Unsupported with Eyes Closed Able to stand 10 seconds with supervision    Standing Unsupported with Feet Together Able to place feet together independently and stand for 1 minute with supervision    From Standing, Reach Forward with Outstretched Arm Can reach forward >5 cm safely (2")    From Standing Position, Pick up Object from Floor Able to pick up shoe, needs supervision    From Standing Position, Turn to Look Behind Over each Shoulder Turn sideways only but maintains balance    Turn 360 Degrees Needs close supervision or verbal cueing    Standing Unsupported, Alternately Place Feet on Step/Stool Able to complete >2 steps/needs minimal assist    Standing Unsupported, One Foot in Front Able to take small step independently and hold 30 seconds    Standing  on One Leg Tries to lift leg/unable to hold 3 seconds but remains standing independently    Total Score 36             Patient reports standing balance items on Berg Balance Test as very challenging and pt reporting decreased confidence in her standing balance without UE support.   PATIENT EDUCATION: Education details: assessment findings, plan, goals Person educated: Patient and pt's friend present for eval  Education method: Explanation Education comprehension: verbalized understanding  HOME EXERCISE PROGRAM: To be initiated next 1-2 visits  GOALS: Goals reviewed with patient? Yes  SHORT TERM GOALS: Target date: 03/21/2024    Patient will be independent in home exercise program to improve strength/mobility for better functional independence with ADLs. Baseline: to be initiated next 1-2 visit Goal status: INITIAL   LONG TERM GOALS: Target date: 05/02/2024    Patient will increase ABC scale score >80% to demonstrate better functional mobility and better confidence with ADLs.  Baseline: 58.75% Goal status: INITIAL  2.  Patient will complete five times sit to stand test in < 10 seconds indicating an increased LE strength and improved balance. Baseline: 13 sec Goal status: INITIAL  3.  Patient will increase Berg Balance score by > 6 points to demonstrate decreased fall risk during functional activities Baseline: 02/29/2024: 36/56 Goal status: INITIAL  4.  Patient will increase 10 meter walk test to >1.36m/s as to improve gait speed for better community ambulation and to reduce fall risk. Baseline: 0.61 m/s with SPC Goal status: INITIAL  5.  Patient will reduce timed up and go to <11  seconds to reduce fall risk and demonstrate improved transfer/gait ability. Baseline: 19.5 sec no AD, 14 sec with SPC Goal status: INITIAL    ASSESSMENT:  CLINICAL IMPRESSION:  Patient is a pleasant 83 y.o. female who was seen today for physical therapy treatment for imbalance and  neuropathy. Therapy session focused on re-assessing positional vestibular tests with pt having negative R and L Dix-Hallpike as well as negative R and L Roll tests. Patient reports having experienced an episode of "lightheadedness" and feeling of "being in a tunnel" at work last week; therefore, performed orthostatic hypotension vital assessment, but it was WNL. Patient participated in [facility] and indicates high fall risk as noted by score of 36/56. Pt with greatest difficulty with items including: turning 360, alternating foot taps, semi-tandem, reaching outside BOS, and SLS. Ms. Herbst will benefit from further skilled PT to improve impairments in order to decrease fall risk, and increase QOL and ease and safety with ADLs.   OBJECTIVE IMPAIRMENTS: Abnormal gait, decreased activity tolerance, decreased balance, decreased mobility, difficulty walking, decreased ROM, decreased strength, dizziness, impaired sensation, impaired UE functional use, improper body mechanics, postural dysfunction, and pain.   ACTIVITY LIMITATIONS: carrying, lifting, bending, standing, squatting, stairs, transfers, bed mobility, bathing, and locomotion level  PARTICIPATION LIMITATIONS: meal prep, cleaning, shopping, community activity, occupation, and yard work  PERSONAL FACTORS: Age, Fitness, Sex, Time since onset of injury/illness/exacerbation, and 3+ comorbidities: Per chart PMH significant for breast CA (R), hx hip fx, hx alcoholism, depression, B12 deficiency, Vitamin D  deficiency, osteoporosis without current pathological fx, mass of upper quadrant L breast, closed comminuted intertrochanteric fx of proximal femur, atherosclerosis of aorta  are also affecting patient's functional outcome.   REHAB POTENTIAL: Good  CLINICAL DECISION MAKING: Evolving/moderate complexity  EVALUATION COMPLEXITY: Moderate  PLAN:  PT FREQUENCY: 1-2x/week  PT DURATION: 12 weeks  PLANNED INTERVENTIONS: 97164- PT Re-evaluation,  97750- Physical Performance Testing, 97110-Therapeutic exercises, 97530- Therapeutic activity, [id number]- Neuromuscular re-education, 97535- Self Care, 16109- Manual therapy, [id number]- Gait training, [phone]- Orthotic Initial, [phone]- Orthotic/Prosthetic subsequent, [phone]- Canalith repositioning, [phone]- Splinting, [phone]- Electrical stimulation (manual), Patient/Family education, Balance training, Stair training, Taping, Joint mobilization, Spinal mobilization, Vestibular training, DME instructions, Wheelchair mobility training, Cryotherapy, and Moist heat  PLAN FOR NEXT SESSION:  - start HEP  - introduce rollator as other AD option in community - standing balance:  - reaching activities   - turning 360 degrees  - alternating foot taps   - progression to semi-tandem   Zuzu Befort, PT, DPT, NCS, CSRS Physical Therapist - [facility]  5:57 PM 02/29/24

## 2024-03-07 ENCOUNTER — Ambulatory Visit: Admitting: Physical Therapy

## 2024-03-07 DIAGNOSIS — M6281 Muscle weakness (generalized): Secondary | ICD-10-CM | POA: Diagnosis not present

## 2024-03-07 DIAGNOSIS — R262 Difficulty in walking, not elsewhere classified: Secondary | ICD-10-CM

## 2024-03-07 DIAGNOSIS — R42 Dizziness and giddiness: Secondary | ICD-10-CM | POA: Diagnosis not present

## 2024-03-07 DIAGNOSIS — R2681 Unsteadiness on feet: Secondary | ICD-10-CM

## 2024-03-07 NOTE — Therapy (Signed)
 OUTPATIENT PHYSICAL THERAPY NEURO TREATMENT   Patient Name: Rebecca Faulkner MRN: [id number] DOB:1941/10/16, 83 y.o., female Today's Date: 03/07/2024   PCP: Rockney Cid, DO REFERRING PROVIDER: Bufford Carne, MD  END OF SESSION:   PT End of Session - 03/07/24 0928     Visit Number 4    Number of Visits 25    Date for PT Re-Evaluation 05/02/24    PT Start Time 0930    PT Stop Time 1015    PT Time Calculation (min) 45 min    Equipment Utilized During Treatment Gait belt    Activity Tolerance Patient tolerated treatment well;No increased pain    Behavior During Therapy [facility] for tasks assessed/performed               Past Medical History:  Diagnosis Date   Breast cancer (HCC) 2013   right breast   Heart murmur 1990   Hypercholesteremia    Lump or mass in breast    Malignant neoplasm of upper-outer quadrant of female breast (HCC) 2013   R-breast T1, NO, MO, ER/PR pos., Her 2 neg.   Osteoporosis    Ulcer 1974   Past Surgical History:  Procedure Laterality Date   BREAST SURGERY Right 2013   CATARACT EXTRACTION W/PHACO Right 04/23/2016   Procedure: CATARACT EXTRACTION PHACO AND INTRAOCULAR LENS PLACEMENT (IOC) RIGHT EYE;  Surgeon: Annell Kidney, MD;  Location: [facility];  Service: Ophthalmology;  Laterality: Right;   CATARACT EXTRACTION W/PHACO Left 06/04/2016   Procedure: CATARACT EXTRACTION PHACO AND INTRAOCULAR LENS PLACEMENT (IOC);  Surgeon: Annell Kidney, MD;  Location: [facility];  Service: Ophthalmology;  Laterality: Left;   CESAREAN SECTION  1969   COLONOSCOPY WITH PROPOFOL  N/A 07/22/2017   Procedure: COLONOSCOPY WITH PROPOFOL ;  Surgeon: Marshall Skeeter, MD;  Location: [facility];  Service: Endoscopy;  Laterality: N/A;   FRACTURE SURGERY Left 2011   arm, plate and 12 screws   LEG SURGERY Right 01/02/2015   MASTECTOMY Right 2013   Right inguinal hernia repair  2013   VEIN SURGERY Left 2015   Patient Active Problem  List   Diagnosis Date Noted   History of diverticulitis 03/12/2020   Left ovarian cyst 03/12/2020   Atherosclerosis of aorta (HCC) 03/12/2020   Osteoporosis 08/05/2019   Closed comminuted intertrochanteric fracture of proximal femur (HCC) 07/27/2018   Mass of upper outer quadrant of left breast 06/01/2018   Goals of care, counseling/discussion 01/31/2018   Osteoporosis without current pathological fracture 07/28/2017   Depression, major, recurrent, mild (HCC) 07/07/2017   B12 deficiency 07/07/2017   Vitamin D  deficiency 07/07/2017   Screening for colon cancer 05/27/2017   Malignant neoplasm of upper-outer quadrant of right breast in female, estrogen receptor positive (HCC) 05/27/2017   Glaucoma 05/26/2017   History of alcoholism (HCC) 05/26/2017   History of hip fracture 05/10/2015   Breast cancer, right (HCC) 05/27/2012   Estrogen receptor positive status (ER+) 05/14/2012    ONSET DATE: 5 years ago  REFERRING DIAG:  R26.89 (ICD-10-CM) - Imbalance  G62.9 (ICD-10-CM) - Neuropathy    THERAPY DIAG:   Unsteadiness on feet  Dizziness and giddiness  Muscle weakness (generalized)  Difficulty in walking, not elsewhere classified  Rationale for Evaluation and Treatment: Rehabilitation  SUBJECTIVE:  SUBJECTIVE STATEMENT:  Pt reports she has been "pretty good." States she is tired today because she was busy at work this past weekend for Mother's Day.  Pt states she has leaned over to pick up things off the floor several times and hasn't had any dizziness. Pt states she is so happy because she has lived with feeling dizzy for a long time.  Denies pain, but is just "tired and achy."   Pt states she has an apt with neurologist, next week.   Initial Eval: Pt is a pleasant 83 y/o female  presenting to PT for imbalance. Pt ambulating with a SPC. She states she initially thought she had vertigo when she started losing her balance where onset of imbalance was about 5 years ago. Pt noticed imbalance with carrying tea while ambulating; she felt she was going to fall/had someone help her. Pt reports she still gets dizzy off and on. She describes it as "weak" and "sometimes the room moves." When she had an MRI recently she saw the room spin. Pt reports no falls in the last six months, but she has had near-falls where she grabs onto stuff.  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 She has neuropathy affecting bilat LE.  Pt accompanied by: friend Counsellor)  PERTINENT HISTORY: Per chart PMH significant for breast CA (R), hx hip fx, hx alcoholism, depression, B12 deficiency, Vitamin D  deficiency, osteoporosis without current pathological fx, mass of upper quadrant L breast, closed comminuted intertrochanteric fx of proximal femur, atherosclerosis of aorta, new diagnosis of normal pressure hydrocephalus  PAIN:  Are you having pain? General arthritis pain; she reports she takes Advil to help her pain  PRECAUTIONS: Fall, normal pressure hydrocephalus  RED FLAGS: Pt states she wears a diaper due to incontinence and says physician aware   WEIGHT BEARING RESTRICTIONS: No  FALLS: Has patient fallen in last 6 months? No  LIVING ENVIRONMENT: Lives with: lives alone Lives in: House/apartment Stairs: 7 steps to get into home, bilat rails Has following equipment at home: Single point cane, Grab bars, and grab bar in tub but does not use anymore  PLOF: indep with ADLs at home but is getting some help at work  PATIENT GOALS: she wants to walk without a cane; I don't want to end up in a wheelchair   OBJECTIVE:  Note: Objective measures were completed at Evaluation unless otherwise noted.  DIAGNOSTIC FINDINGS:  EXAM  01/18/2024: MRI HEAD WITHOUT CONTRAST IMPRESSION: 1. Mild ventriculomegaly with narrowing of the callosal angle. Findings can be seen with normal pressure hydrocephalus in the appropriate clinical setting. 2. Chronic small vessel changes. 3. No acute infarct.     Electronically Signed   By: Johnanna Mylar M.D.   On: 02/10/2024 11:19  CT HEAD 10/20/22: "FINDINGS: Brain: No acute infarct or hemorrhage. Lateral ventricles and midline structures are stable. No acute extra-axial fluid collections. No mass effect.   Vascular: No hyperdense vessel or unexpected calcification.   Skull: Normal. Negative for fracture or focal lesion.   Sinuses/Orbits: Mild mucoperiosteal thickening throughout the ethmoid air cells. Remaining paranasal sinuses are clear.   Other: None.   IMPRESSION: 1. Ethmoid sinus disease. 2. No acute intracranial process.     Electronically Signed   By: Bobbye Burrow M.D.   On: 10/20/2022 16:14"  COGNITION: Overall cognitive status: Within functional limits for tasks assessed   SENSATION: Pt has  neuropathy affecting bilat LE and RUE; she describes sensation as numbness  COORDINATION: WFL BLE and BUE with rapid alt movement, chin<>target; shin>contrlat LE   EDEMA:  She reports every now and then she gets some swelling in BLE has been ongoing 4 years /not new   POSTURE: rounded shoulders, increased thoracic kyphosis    LOWER EXTREMITY MMT:   Grossly 4/5 bilat LE most deficits found proximal musculature   BED MOBILITY:  Rolling makes pt lightheaded   TRANSFERS: Able to complete hands-free in session with SBA  RAMP:  impaired confidence in balance on ramps per ABC questionnaire    STAIRS:  impaired confidence in balance on stairs per ABC questionnaire   GAIT: See below: decreased gait speed, uses SPC, close CGA without AD, shuffling-like gait/decreased step length and decreased heel strike, limited arm-swing and hip extension bilat, L  lateral sway in mid-stance  FUNCTIONAL TESTS:  5 times sit to stand: 13 sec, hands-free  Timed up and go (TUG): 19,5 sec no AD; 14 sec with SPC  10 meter walk test: 0.61 m/s with [facility] Berg Balance Scale: deferred    PATIENT SURVEYS:  ABC scale 58.75%  Vestibular Assessment from 02/22/2024:  OCULOMOTOR EXAM:  Ocular Alignment: normal, some skin drooping around R eye impacting alignment slightly, but relatively symmetrical  Ocular ROM: No Limitations  Spontaneous Nystagmus: absent  Gaze-Induced Nystagmus: absent  Smooth Pursuits: intact  Saccades: slow  Convergence/Divergence: appears normal  Cover/Cross-Cover: WNL  VESTIBULAR - OCULAR REFLEX:   Slow VOR: Normal  VOR Cancellation: Normal, when having pt perform it herself she moved her head more quickly and she said she felt "a little lightheadedness"  Head-Impulse Test: unable to test due to pt guarding  Dynamic Visual Acuity: to be tested if needed  POSITIONAL TESTING: Right Dix-Hallpike: upbeating, right nystagmus and Duration: <30 seconds Right Sidelying: symptomatic upon return to sitting upright Left Sidelying: no nystagmus and no symptoms  MOTION SENSITIVITY: to be tested if needed  Motion Sensitivity Quotient                                                                                                                              TREATMENT DATE: 03/07/2024  Pt ambulates into clinic using hurricane with very short, shuffled gait and guarded upper body posturing along with very slow gait speed. Close SBA provided for patient safety.  Therapist introduced patient to the rollator as an alternative AD to use for community mobility; however, pt politely but adamantly declined trying this AD, stating she feels that it is a step down and she doesn't want to have a decline in her functional mobility level.   Gait training including:  ~12ft using L UE support on hurricane with CGA/light min A for steadying  Cuing for longer  step lengths and increased gait speed  Continues to have more shuffled step with L LE compared to R LE Transitioned to only R HHA providing min A  for balance additional ~148ft  Continued cuing for longer step lengths  Side stepping ~53ft each direction down/back x3 reps  Cuing not to shuffle/slide feet on the ground No UE support  Light min A for steadying  Backwards gait training ~54ft x4 reps Pt reporting she does not like this exercise Educated pt on the 3 balance systems and importance of not relying only on vision for balance CGA/light min A for steadying  Standing balance interventions including: NBOS 1x 30 sec NBOS horizontal head turns x10 reps NBOS vertical head turns x10 reps This is more challenging than horizontal head turns Alternating foot taps to 4" step, no UE support, but close to balance bar for pt safety and confidence 2 x4 reps per LE Requires constant min A for balance and benefits from words of encouragement  Repeated sit<>stands from green chair without UE support x10 reps with supervision for safety.  Prescribed the below HEP and printout provided.   PATIENT EDUCATION: Education details: assessment findings, plan, goals Person educated: Patient and pt's friend present for eval  Education method: Explanation Education comprehension: verbalized understanding  HOME EXERCISE PROGRAM:  Access Code: [email or web address] URL: [email or web address] Date: 03/07/2024 Prepared by: Carlen Chasten  Exercises - Sit to Stand with Counter Support  - 1 x daily - 7 x weekly - 2 sets - 10 reps - Narrow Stance with Unilateral Counter Support  - 1 x daily - 7 x weekly - 2 sets - 30 seconds hold   GOALS: Goals reviewed with patient? Yes  SHORT TERM GOALS: Target date: 03/21/2024    Patient will be independent in home exercise program to improve strength/mobility for better functional independence with ADLs. Baseline: initiated on 03/07/2024 Goal status:  INITIAL   LONG TERM GOALS: Target date: 05/02/2024    Patient will increase ABC scale score >80% to demonstrate better functional mobility and better confidence with ADLs.  Baseline: 58.75% Goal status: INITIAL  2.  Patient will complete five times sit to stand test in < 10 seconds indicating an increased LE strength and improved balance. Baseline: 13 sec Goal status: INITIAL  3.  Patient will increase Berg Balance score by > 6 points to demonstrate decreased fall risk during functional activities Baseline: 02/29/2024: 36/56 Goal status: INITIAL  4.  Patient will increase 10 meter walk test to >1.22m/s as to improve gait speed for better community ambulation and to reduce fall risk. Baseline: 0.61 m/s with SPC Goal status: INITIAL  5.  Patient will reduce timed up and go to <11 seconds to reduce fall risk and demonstrate improved transfer/gait ability. Baseline: 19.5 sec no AD, 14 sec with SPC Goal status: INITIAL    ASSESSMENT:  CLINICAL IMPRESSION:  Patient is a pleasant 83 y.o. female who was seen today for physical therapy treatment for imbalance and neuropathy. Patient reports no longer experiencing dizziness when bending over to pick up objects from the floor. Patient denies any other episodes of dizziness like symptoms since last therapy session. Therapy session focused on progression of dynamic gait training with pt tolerating well; however, pt very unsure of backwards gait training. Educated pt on 3 balance systems and importance of training them. Patient also participated in standing balance interventions with noticeable increased challenge with vertical head turns compared to horizontal. Provided initial HEP with only 2 exercises as pt reports working 6days/week and being very tired after work. Ms. Sawin will benefit from further skilled PT to improve impairments in order to decrease fall risk, and increase QOL and  ease and safety with ADLs.   OBJECTIVE IMPAIRMENTS: Abnormal  gait, decreased activity tolerance, decreased balance, decreased mobility, difficulty walking, decreased ROM, decreased strength, dizziness, impaired sensation, impaired UE functional use, improper body mechanics, postural dysfunction, and pain.   ACTIVITY LIMITATIONS: carrying, lifting, bending, standing, squatting, stairs, transfers, bed mobility, bathing, and locomotion level  PARTICIPATION LIMITATIONS: meal prep, cleaning, shopping, community activity, occupation, and yard work  PERSONAL FACTORS: Age, Fitness, Sex, Time since onset of injury/illness/exacerbation, and 3+ comorbidities: Per chart PMH significant for breast CA (R), hx hip fx, hx alcoholism, depression, B12 deficiency, Vitamin D  deficiency, osteoporosis without current pathological fx, mass of upper quadrant L breast, closed comminuted intertrochanteric fx of proximal femur, atherosclerosis of aorta are also affecting patient's functional outcome.   REHAB POTENTIAL: Good  CLINICAL DECISION MAKING: Evolving/moderate complexity  EVALUATION COMPLEXITY: Moderate  PLAN:  PT FREQUENCY: 1-2x/week  PT DURATION: 12 weeks  PLANNED INTERVENTIONS: 97164- PT Re-evaluation, 97750- Physical Performance Testing, 97110-Therapeutic exercises, 97530- Therapeutic activity, [id number]- Neuromuscular re-education, 97535- Self Care, [id number]- Manual therapy, [id number]- Gait training, [phone]- Orthotic Initial, [phone]- Orthotic/Prosthetic subsequent, [phone]- Canalith repositioning, [phone]- Splinting, [phone]- Electrical stimulation (manual), Patient/Family education, Balance training, Stair training, Taping, Joint mobilization, Spinal mobilization, Vestibular training, DME instructions, Wheelchair mobility training, Cryotherapy, and Moist heat  PLAN FOR NEXT SESSION:  - follow-up on new HEP  - standing balance:  - reaching activities   - turning 360 degrees  - alternating foot taps   - progression to semi-tandem  - side stepping over obstacles - dynamic gait  training    Lasonja Lakins, PT, DPT, NCS, CSRS Physical Therapist - [place]  [facility]  11:30 AM 03/07/24

## 2024-03-14 ENCOUNTER — Ambulatory Visit: Admitting: Physical Therapy

## 2024-03-14 DIAGNOSIS — R2681 Unsteadiness on feet: Secondary | ICD-10-CM

## 2024-03-14 DIAGNOSIS — R262 Difficulty in walking, not elsewhere classified: Secondary | ICD-10-CM

## 2024-03-14 DIAGNOSIS — G9389 Other specified disorders of brain: Secondary | ICD-10-CM | POA: Diagnosis not present

## 2024-03-14 DIAGNOSIS — R42 Dizziness and giddiness: Secondary | ICD-10-CM | POA: Diagnosis not present

## 2024-03-14 DIAGNOSIS — R531 Weakness: Secondary | ICD-10-CM | POA: Diagnosis not present

## 2024-03-14 DIAGNOSIS — R32 Unspecified urinary incontinence: Secondary | ICD-10-CM | POA: Diagnosis not present

## 2024-03-14 DIAGNOSIS — M6281 Muscle weakness (generalized): Secondary | ICD-10-CM

## 2024-03-14 DIAGNOSIS — G629 Polyneuropathy, unspecified: Secondary | ICD-10-CM | POA: Diagnosis not present

## 2024-03-14 NOTE — Therapy (Signed)
 OUTPATIENT PHYSICAL THERAPY NEURO TREATMENT   Patient Name: Rebecca Faulkner MRN: [id number] DOB:[birth year], 83 y.o., female Today's Date: 03/14/2024   PCP: Rockney Cid, DO REFERRING PROVIDER: Bufford Carne, MD  END OF SESSION:   PT End of Session - 03/14/24 0851     Visit Number 5    Number of Visits 25    Date for PT Re-Evaluation 05/02/24    PT Start Time 0850    PT Stop Time 0930    PT Time Calculation (min) 40 min    Equipment Utilized During Treatment Gait belt    Activity Tolerance Patient tolerated treatment well;No increased pain    Behavior During Therapy [facility] for tasks assessed/performed                Past Medical History:  Diagnosis Date   Breast cancer (HCC) 2013   right breast   Heart murmur 1990   Hypercholesteremia    Lump or mass in breast    Malignant neoplasm of upper-outer quadrant of female breast (HCC) 2013   R-breast T1, NO, MO, ER/PR pos., Her 2 neg.   Osteoporosis    Ulcer 1974   Past Surgical History:  Procedure Laterality Date   BREAST SURGERY Right 2013   CATARACT EXTRACTION W/PHACO Right 04/23/2016   Procedure: CATARACT EXTRACTION PHACO AND INTRAOCULAR LENS PLACEMENT (IOC) RIGHT EYE;  Surgeon: Annell Kidney, MD;  Location: [facility];  Service: Ophthalmology;  Laterality: Right;   CATARACT EXTRACTION W/PHACO Left 06/04/2016   Procedure: CATARACT EXTRACTION PHACO AND INTRAOCULAR LENS PLACEMENT (IOC);  Surgeon: Annell Kidney, MD;  Location: [facility];  Service: Ophthalmology;  Laterality: Left;   CESAREAN SECTION  1969   COLONOSCOPY WITH PROPOFOL  N/A 07/22/2017   Procedure: COLONOSCOPY WITH PROPOFOL ;  Surgeon: Marshall Skeeter, MD;  Location: [facility];  Service: Endoscopy;  Laterality: N/A;   FRACTURE SURGERY Left 2011   arm, plate and 12 screws   LEG SURGERY Right 01/02/2015   MASTECTOMY Right 2013   Right inguinal hernia repair  2013   VEIN SURGERY Left 2015   Patient Active Problem  List   Diagnosis Date Noted   History of diverticulitis 03/12/2020   Left ovarian cyst 03/12/2020   Atherosclerosis of aorta (HCC) 03/12/2020   Osteoporosis 08/05/2019   Closed comminuted intertrochanteric fracture of proximal femur (HCC) 07/27/2018   Mass of upper outer quadrant of left breast 06/01/2018   Goals of care, counseling/discussion 01/31/2018   Osteoporosis without current pathological fracture 07/28/2017   Depression, major, recurrent, mild (HCC) 07/07/2017   B12 deficiency 07/07/2017   Vitamin D  deficiency 07/07/2017   Screening for colon cancer 05/27/2017   Malignant neoplasm of upper-outer quadrant of right breast in female, estrogen receptor positive (HCC) 05/27/2017   Glaucoma 05/26/2017   History of alcoholism (HCC) 05/26/2017   History of hip fracture 05/10/2015   Breast cancer, right (HCC) 05/27/2012   Estrogen receptor positive status (ER+) 05/14/2012    ONSET DATE: 5 years ago  REFERRING DIAG:  R26.89 (ICD-10-CM) - Imbalance  G62.9 (ICD-10-CM) - Neuropathy    THERAPY DIAG:   Unsteadiness on feet  Dizziness and giddiness  Muscle weakness (generalized)  Difficulty in walking, not elsewhere classified  Rationale for Evaluation and Treatment: Rehabilitation  SUBJECTIVE:  SUBJECTIVE STATEMENT:  Pt reports she has been trying to take bigger steps when walking down the hallway at work, thinking/saying to herself "big steps." Pt reports she hasn't been able to do her homework consistently, but instead has tried to incorporate her exercises throughout her day.   Pt states she is a little "lightheaded" today, but pt thinks it is a sinus infection.  Denies pain. Denies stumbles/falls.   Pt reports she will be unavailable to come to therapy for approximately 2 weeks in  June because she is going to family events (graduation and wedding).  Pt states she has an apt with neurologist today.   Initial Eval: Pt is a pleasant 83 y/o female presenting to PT for imbalance. Pt ambulating with a SPC. She states she initially thought she had vertigo when she started losing her balance where onset of imbalance was about 5 years ago. Pt noticed imbalance with carrying tea while ambulating; she felt she was going to fall/had someone help her. Pt reports she still gets dizzy off and on. She describes it as "weak" and "sometimes the room moves." When she had an MRI recently she saw the room spin. Pt reports no falls in the last six months, but she has had near-falls where she grabs onto stuff.  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 She has neuropathy affecting bilat LE.  Pt accompanied by: friend Counsellor)  PERTINENT HISTORY: Per chart PMH significant for breast CA (R), hx hip fx, hx alcoholism, depression, B12 deficiency, Vitamin D  deficiency, osteoporosis without current pathological fx, mass of upper quadrant L breast, closed comminuted intertrochanteric fx of proximal femur, atherosclerosis of aorta, new diagnosis of normal pressure hydrocephalus  PAIN:  Are you having pain? General arthritis pain; she reports she takes Advil to help her pain  PRECAUTIONS: Fall, normal pressure hydrocephalus  RED FLAGS: Pt states she wears a diaper due to incontinence and says physician aware   WEIGHT BEARING RESTRICTIONS: No  FALLS: Has patient fallen in last 6 months? No  LIVING ENVIRONMENT: Lives with: lives alone Lives in: House/apartment Stairs: 7 steps to get into home, bilat rails Has following equipment at home: Single point cane, Grab bars, and grab bar in tub but does not use anymore  PLOF: indep with ADLs at home but is getting some help at work  PATIENT GOALS: she wants to walk without a cane;  I don't want to end up in a wheelchair   OBJECTIVE:  Note: Objective measures were completed at Evaluation unless otherwise noted.  DIAGNOSTIC FINDINGS:  EXAM 01/18/2024: MRI HEAD WITHOUT CONTRAST IMPRESSION: 1. Mild ventriculomegaly with narrowing of the callosal angle. Findings can be seen with normal pressure hydrocephalus in the appropriate clinical setting. 2. Chronic small vessel changes. 3. No acute infarct.     Electronically Signed   By: Johnanna Mylar M.D.   On: 02/10/2024 11:19  CT HEAD 10/20/22: "FINDINGS: Brain: No acute infarct or hemorrhage. Lateral ventricles and midline structures are stable. No acute extra-axial fluid collections. No mass effect.   Vascular: No hyperdense vessel or unexpected calcification.   Skull: Normal. Negative for fracture or focal lesion.   Sinuses/Orbits: Mild mucoperiosteal thickening throughout the ethmoid air cells. Remaining paranasal sinuses are clear.   Other: None.   IMPRESSION: 1. Ethmoid sinus disease. 2. No acute intracranial process.     Electronically Signed   By: Bobbye Burrow  M.D.   On: 10/20/2022 16:14"  COGNITION: Overall cognitive status: Within functional limits for tasks assessed   SENSATION: Pt has neuropathy affecting bilat LE and RUE; she describes sensation as numbness  COORDINATION: WFL BLE and BUE with rapid alt movement, chin<>target; shin>contrlat LE   EDEMA:  She reports every now and then she gets some swelling in BLE has been ongoing 4 years /not new   POSTURE: rounded shoulders, increased thoracic kyphosis    LOWER EXTREMITY MMT:   Grossly 4/5 bilat LE most deficits found proximal musculature   BED MOBILITY:  Rolling makes pt lightheaded   TRANSFERS: Able to complete hands-free in session with SBA  RAMP:  impaired confidence in balance on ramps per ABC questionnaire    STAIRS:  impaired confidence in balance on stairs per ABC questionnaire   GAIT: See below:  decreased gait speed, uses SPC, close CGA without AD, shuffling-like gait/decreased step length and decreased heel strike, limited arm-swing and hip extension bilat, L lateral sway in mid-stance  FUNCTIONAL TESTS:  5 times sit to stand: 13 sec, hands-free  Timed up and go (TUG): 19,5 sec no AD; 14 sec with SPC  10 meter walk test: 0.61 m/s with [facility] Berg Balance Scale: deferred    PATIENT SURVEYS:  ABC scale 58.75%  Vestibular Assessment from 02/22/2024:  OCULOMOTOR EXAM:  Ocular Alignment: normal, some skin drooping around R eye impacting alignment slightly, but relatively symmetrical  Ocular ROM: No Limitations  Spontaneous Nystagmus: absent  Gaze-Induced Nystagmus: absent  Smooth Pursuits: intact  Saccades: slow  Convergence/Divergence: appears normal  Cover/Cross-Cover: WNL  VESTIBULAR - OCULAR REFLEX:   Slow VOR: Normal  VOR Cancellation: Normal, when having pt perform it herself she moved her head more quickly and she said she felt "a little lightheadedness"  Head-Impulse Test: unable to test due to pt guarding  Dynamic Visual Acuity: to be tested if needed  POSITIONAL TESTING: Right Dix-Hallpike: upbeating, right nystagmus and Duration: <30 seconds Right Sidelying: symptomatic upon return to sitting upright Left Sidelying: no nystagmus and no symptoms  MOTION SENSITIVITY: to be tested if needed  Motion Sensitivity Quotient                                                                                                                              TREATMENT DATE: 03/14/2024  Pt ambulates into clinic using hurricane with very short, shuffled gait; however, pt attempting to take larger steps and appears less guarded in her upper body. Possible slight improvement in her gait speed. Close SBA provided for patient safety.  Vitals in sitting to start session: BP 121/82 (MAP 94), HR 77bpm  No reports of lightheadedness during session  Gait training including:  2x ~148ft  (seated rest break) no UE support, with CGA/ light min A for steadying  Cuing for longer step lengths and increased gait speed  Continues to have more shuffled step with L LE compared to R LE Pt  also improving arm swing Stair navigation training ascending/descending 4 steps using B HRs with reciprocal pattern in both directions Pt states this is easy using the HRs because she has 7 steps at home and does it every day   Dynamic stepping balance interventions at balance bar for pt safety and use as needed:  Forward/backwards stepping over small PVC pipe x10 reps per LE  Requires CGA/light min A for safety/steadying  Side stepping over small PVC pipe x10reps CGA for steadying This was less challenging than fwd/back 4 square stepping ~5reps in each direction CW/CCW Requires light min A for safety * pt intermittently requires use of balance bar for support during these interventions   Standing balance interventions including: NBOS 1x 30 sec NBOS horizontal head turns x10 reps NBOS vertical head turns x10 reps This is easier for pt today with no significant sway noted 1/2 tandem stance x30sec  each LE No significant sway with this either, but pt did report it as challenging  Repeated sit<>stands from green chair without UE support x10 reps with supervision for safety - cuing for pt to tap her hips down rather than come back to a full sit for increased challenge - educated pt to avoid letting knees collapse together     PATIENT EDUCATION: Education details: assessment findings, plan, goals Person educated: Patient and pt's friend present for eval  Education method: Explanation Education comprehension: verbalized understanding  HOME EXERCISE PROGRAM:  Access Code: [email or web address] URL: [email or web address] Date: 03/07/2024 Prepared by: Carlen Chasten  Exercises - Sit to Stand with Counter Support  - 1 x daily - 7 x weekly - 2 sets - 10 reps - Narrow Stance with Unilateral  Counter Support  - 1 x daily - 7 x weekly - 2 sets - 30 seconds hold   GOALS: Goals reviewed with patient? Yes  SHORT TERM GOALS: Target date: 03/21/2024    Patient will be independent in home exercise program to improve strength/mobility for better functional independence with ADLs. Baseline: initiated on 03/07/2024 Goal status: INITIAL   LONG TERM GOALS: Target date: 05/02/2024    Patient will increase ABC scale score >80% to demonstrate better functional mobility and better confidence with ADLs.  Baseline: 58.75% Goal status: INITIAL  2.  Patient will complete five times sit to stand test in < 10 seconds indicating an increased LE strength and improved balance. Baseline: 13 sec Goal status: INITIAL  3.  Patient will increase Berg Balance score by > 6 points to demonstrate decreased fall risk during functional activities Baseline: 02/29/2024: 36/56 Goal status: INITIAL  4.  Patient will increase 10 meter walk test to >1.68m/s as to improve gait speed for better community ambulation and to reduce fall risk. Baseline: 0.61 m/s with SPC Goal status: INITIAL  5.  Patient will reduce timed up and go to <11 seconds to reduce fall risk and demonstrate improved transfer/gait ability. Baseline: 19.5 sec no AD, 14 sec with SPC Goal status: INITIAL    ASSESSMENT:  CLINICAL IMPRESSION:  Patient is a pleasant 83 y.o. female who was seen today for physical therapy treatment for imbalance and neuropathy. Patient doesn't report any other episodes of dizziness like symptoms since last therapy session; however, does report some "lightheadedness" but vitals WNL. Therapy session focused on progression of dynamic gait training with decreased UE support for increased challenge targeting increased step lengths and gait speed. Patient also participated in dynamic stepping balance interventions with pt unsure of backwards stepping over small obstacle. Progressed static standing  balance interventions  with improvement noticed with vertical head turns and able to progress to 1/2 tandem. Educated pt to continue with previously prescribed HEP with plan to upgrade in upcoming visits as appropriate. Ms. Folds will benefit from further skilled PT to improve impairments in order to decrease fall risk, and increase QOL and ease and safety with ADLs.   OBJECTIVE IMPAIRMENTS: Abnormal gait, decreased activity tolerance, decreased balance, decreased mobility, difficulty walking, decreased ROM, decreased strength, dizziness, impaired sensation, impaired UE functional use, improper body mechanics, postural dysfunction, and pain.   ACTIVITY LIMITATIONS: carrying, lifting, bending, standing, squatting, stairs, transfers, bed mobility, bathing, and locomotion level  PARTICIPATION LIMITATIONS: meal prep, cleaning, shopping, community activity, occupation, and yard work  PERSONAL FACTORS: Age, Fitness, Sex, Time since onset of injury/illness/exacerbation, and 3+ comorbidities: Per chart PMH significant for breast CA (R), hx hip fx, hx alcoholism, depression, B12 deficiency, Vitamin D  deficiency, osteoporosis without current pathological fx, mass of upper quadrant L breast, closed comminuted intertrochanteric fx of proximal femur, atherosclerosis of aorta are also affecting patient's functional outcome.   REHAB POTENTIAL: Good  CLINICAL DECISION MAKING: Evolving/moderate complexity  EVALUATION COMPLEXITY: Moderate  PLAN:  PT FREQUENCY: 1-2x/week  PT DURATION: 12 weeks  PLANNED INTERVENTIONS: 97164- PT Re-evaluation, 97750- Physical Performance Testing, 97110-Therapeutic exercises, 97530- Therapeutic activity, [id number]- Neuromuscular re-education, 97535- Self Care, [id number]- Manual therapy, [id number]- Gait training, [phone]- Orthotic Initial, [phone]- Orthotic/Prosthetic subsequent, [phone]- Canalith repositioning, [phone]- Splinting, [phone]- Electrical stimulation (manual), Patient/Family education, Balance training, Stair  training, Taping, Joint mobilization, Spinal mobilization, Vestibular training, DME instructions, Wheelchair mobility training, Cryotherapy, and Moist heat  PLAN FOR NEXT SESSION:  - upgrade balance exercise on HEP   - maybe alternating foot taps at counter? - standing balance:  - reaching activities   - turning 360 degrees  - alternating foot taps   - progression to semi-tandem  - fwd/back & side stepping over obstacles - dynamic gait training  - need to include turning more    Janit Cutter, PT, DPT, NCS, CSRS Physical Therapist - [facility]  [facility]  9:38 AM 03/14/24

## 2024-03-18 ENCOUNTER — Ambulatory Visit

## 2024-03-18 VITALS — BP 126/86 | Ht 62.0 in | Wt 150.0 lb

## 2024-03-18 DIAGNOSIS — Z0000 Encounter for general adult medical examination without abnormal findings: Secondary | ICD-10-CM

## 2024-03-18 DIAGNOSIS — Z2821 Immunization not carried out because of patient refusal: Secondary | ICD-10-CM

## 2024-03-18 NOTE — Patient Instructions (Signed)
 Ms. Giovannetti , Thank you for taking time out of your busy schedule to complete your Annual Wellness Visit with me. I enjoyed our conversation and look forward to speaking with you again next year. I, as well as your care team,  appreciate your ongoing commitment to your health goals. Please review the following plan we discussed and let me know if I can assist you in the future. Your Game plan/ To Do List    Referrals: If you haven't heard from the office you've been referred to, please reach out to them at the phone provided.   Follow up Visits: Next Medicare AWV with our clinical staff: 03/24/2025 Have you seen your provider in the last 6 months (3 months if uncontrolled diabetes)? Yes Next Office Visit with your provider: 08/22/2024  Clinician Recommendations:  Aim for 30 minutes of exercise or brisk walking, 6-8 glasses of water, and 5 servings of fruits and vegetables each day.       This is a list of the screening recommended for you and due dates:  Health Maintenance  Topic Date Due   Zoster (Shingles) Vaccine (1 of 2) 03/22/1960   COVID-19 Vaccine (3 - Pfizer risk series) 01/11/2020   Flu Shot  05/27/2024   DTaP/Tdap/Td vaccine (2 - Td or Tdap) 07/04/2024   Medicare Annual Wellness Visit  03/18/2025   Pneumonia Vaccine  Completed   DEXA scan (bone density measurement)  Completed   HPV Vaccine  Aged Out   Meningitis B Vaccine  Aged Out   Colon Cancer Screening  Discontinued   Hepatitis C Screening  Discontinued    Advanced directives: patient declined Advance Care Planning is important because it:  [x]  Makes sure you receive the medical care that is consistent with your values, goals, and preferences  [x]  It provides guidance to your family and loved ones and reduces their decisional burden about whether or not they are making the right decisions based on your wishes.  Follow the link provided in your after visit summary or read over the paperwork we have mailed to you to  help you started getting your Advance Directives in place. If you need assistance in completing these, please reach out to us  so that we can help you!  See attachments for Preventive Care and Fall Prevention Tips.

## 2024-03-18 NOTE — Progress Notes (Signed)
 Because this visit was a virtual/telehealth visit,  certain criteria was not obtained, such a blood pressure, CBG if applicable, and timed get up and go. Any medications not marked as "taking" were not mentioned during the medication reconciliation part of the visit. Any vitals not documented were not able to be obtained due to this being a telehealth visit or patient was unable to self-report a recent blood pressure reading due to a lack of equipment at home via telehealth. Vitals that have been documented are verbally provided by the patient.   This visit was performed by a medical professional under my direct supervision. I was immediately available for consultation/collaboration. I have reviewed and agree with the Annual Wellness Visit documentation.  Subjective:   Rebecca Faulkner is a 83 y.o. who presents for a Medicare Wellness preventive visit.  As a reminder, Annual Wellness Visits don't include a physical exam, and some assessments may be limited, especially if this visit is performed virtually. We may recommend an in-person follow-up visit with your provider if needed.  Visit Complete: Virtual I connected with  Rebecca Faulkner on 03/18/24 by a audio enabled telemedicine application and verified that I am speaking with the correct person using two identifiers.  Patient Location: Home  Provider Location: Home Office  I discussed the limitations of evaluation and management by telemedicine. The patient expressed understanding and agreed to proceed.  Vital Signs: Because this visit was a virtual/telehealth visit, some criteria may be missing or patient reported. Any vitals not documented were not able to be obtained and vitals that have been documented are patient reported.  VideoDeclined- This patient declined Librarian, academic. Therefore the visit was completed with audio only.  Persons Participating in Visit: Patient.  AWV Questionnaire: No: Patient  Medicare AWV questionnaire was not completed prior to this visit.  Cardiac Risk Factors include: advanced age (>26men, >18 women)     Objective:     Today's Vitals   03/18/24 0819  BP: 126/86  Weight: 150 lb (68 kg)  Height: 5\' 2"  (1.575 m)   Body mass index is 27.44 kg/m.     03/18/2024    8:18 AM 02/08/2024    8:21 AM 11/13/2023    7:44 AM 10/20/2022    1:10 PM 09/24/2020    1:14 PM 07/30/2020    9:13 AM 05/08/2020    8:16 AM  Advanced Directives  Does Patient Have a Medical Advance Directive? No No No No No No No  Would patient like information on creating a medical advance directive? No - Patient declined Yes (MAU/Ambulatory/Procedural Areas - Information given)   No - Patient declined No - Patient declined Yes (MAU/Ambulatory/Procedural Areas - Information given)    Current Medications (verified) Outpatient Encounter Medications as of 03/18/2024  Medication Sig   cholecalciferol (VITAMIN D3) 25 MCG (1000 UNIT) tablet Take 1,000 Units by mouth daily.   gabapentin (NEURONTIN) 100 MG capsule Take 100 mg in the morning and 200 mg at night.   Magnesium Glycinate 100 MG CAPS Take 200 mg by mouth.   Multiple Vitamins-Minerals (MULTIVITAMIN WITH MINERALS) tablet Take 1 tablet by mouth daily.   simvastatin  (ZOCOR ) 40 MG tablet Take 1 tablet (40 mg total) by mouth at bedtime.   promethazine -dextromethorphan (PROMETHAZINE -DM) 6.25-15 MG/5ML syrup Take 2.5-5 mLs by mouth 2 (two) times daily as needed for cough. (Patient not taking: Reported on 03/18/2024)   No facility-administered encounter medications on file as of 03/18/2024.    Allergies (verified) Codeine  and Pravastatin    History: Past Medical History:  Diagnosis Date   Breast cancer (HCC) 2013   right breast   Heart murmur 1990   Hypercholesteremia    Lump or mass in breast    Malignant neoplasm of upper-outer quadrant of female breast (HCC) 2013   R-breast T1, NO, MO, ER/PR pos., Her 2 neg.   Osteoporosis    Ulcer  1974   Past Surgical History:  Procedure Laterality Date   BREAST SURGERY Right 2013   CATARACT EXTRACTION W/PHACO Right 04/23/2016   Procedure: CATARACT EXTRACTION PHACO AND INTRAOCULAR LENS PLACEMENT (IOC) RIGHT EYE;  Surgeon: Annell Kidney, MD;  Location: [facility];  Service: Ophthalmology;  Laterality: Right;   CATARACT EXTRACTION W/PHACO Left 06/04/2016   Procedure: CATARACT EXTRACTION PHACO AND INTRAOCULAR LENS PLACEMENT (IOC);  Surgeon: Annell Kidney, MD;  Location: [facility];  Service: Ophthalmology;  Laterality: Left;   CESAREAN SECTION  1969   COLONOSCOPY WITH PROPOFOL  N/A 07/22/2017   Procedure: COLONOSCOPY WITH PROPOFOL ;  Surgeon: Marshall Skeeter, MD;  Location: [facility];  Service: Endoscopy;  Laterality: N/A;   FRACTURE SURGERY Left 2011   arm, plate and 12 screws   LEG SURGERY Right 01/02/2015   MASTECTOMY Right 2013   Right inguinal hernia repair  2013   VEIN SURGERY Left 2015   Family History  Problem Relation Age of Onset   Dementia Mother    Stroke Father    Diabetes Brother    Breast cancer Neg Hx    Social History   Socioeconomic History   Marital status: Divorced    Spouse name: Not on file   Number of children: 1   Years of education: Not on file   Highest education level: Not on file  Occupational History   Not on file  Tobacco Use   Smoking status: Never   Smokeless tobacco: Never  Vaping Use   Vaping status: Never Used  Substance and Sexual Activity   Alcohol use: No    Comment: Quit drinking  11/04/2016   Drug use: No   Sexual activity: Not Currently  Other Topics Concern   Not on file  Social History Narrative   Works full time at [employer]. She is the Production designer, theatre/television/film and to go window    She has one son  - that lives in [place]   Two dogs and one cat.    Social Drivers of Corporate investment banker Strain: Low Risk  (03/18/2024)   Overall Financial Resource Strain (CARDIA)    Difficulty of Paying Living  Expenses: Not hard at all  Food Insecurity: No Food Insecurity (03/18/2024)   Hunger Vital Sign    Worried About Running Out of Food in the Last Year: Never true    Ran Out of Food in the Last Year: Never true  Transportation Needs: No Transportation Needs (03/18/2024)   PRAPARE - Administrator, Civil Service (Medical): No    Lack of Transportation (Non-Medical): No  Physical Activity: Inactive (03/18/2024)   Exercise Vital Sign    Days of Exercise per Week: 0 days    Minutes of Exercise per Session: 0 min  Stress: No Stress Concern Present (03/18/2024)   [employer] of Occupational Health - Occupational Stress Questionnaire    Feeling of Stress : Not at all  Social Connections: Unknown (03/18/2024)   Social Connection and Isolation Panel [NHANES]    Frequency of Communication with Friends and Family: Patient declined  Frequency of Social Gatherings with Friends and Family: Patient declined    Attends Religious Services: Patient declined    Database administrator or Organizations: Patient declined    Attends Engineer, structural: Patient declined    Marital Status: Divorced    Tobacco Counseling Counseling given: Not Answered    Clinical Intake:  Pre-visit preparation completed: Yes  Pain : No/denies pain     BMI - recorded: 27.44 Nutritional Status: BMI 25 -29 Overweight Nutritional Risks: None Diabetes: No  Lab Results  Component Value Date   HGBA1C 5.8 (H) 11/10/2022   HGBA1C 5.5 04/21/2022   HGBA1C 5.5 05/28/2020     How often do you need to have someone help you when you read instructions, pamphlets, or other written materials from your doctor or pharmacy?: 1 - Never  Interpreter Needed?: No  Comments: Hs graduation Information entered by :: [employer]   Activities of Daily Living     03/18/2024    8:23 AM 08/17/2023    9:58 AM  In your present state of health, do you have any difficulty performing the following  activities:  Hearing? 0 0  Vision? 0 0  Difficulty concentrating or making decisions? 1 0  Comment sometimes remembering   Walking or climbing stairs? 0 1  Dressing or bathing? 0 0  Doing errands, shopping? 0 1  Preparing Food and eating ? N   Using the Toilet? N   In the past six months, have you accidently leaked urine? Y   Comment patient is going to a specialist   Do you have problems with loss of bowel control? Y   Managing your Medications? N   Managing your Finances? N   Housekeeping or managing your Housekeeping? N     Patient Care Team: Rockney Cid, DO as PCP - General (Internal Medicine) Arleen Lacer, MD as Attending Physician (Family Medicine) Satira Curet, MD (Inactive) as Referring Physician (Hematology and Oncology) Annell Kidney, MD as Referring Physician (Ophthalmology) Marquita Situ Magali Schmitz, MD as Consulting Physician (General Surgery)  Indicate any recent Medical Services you may have received from other than [facility] providers in the past year (date may be approximate).     Assessment:    This is a routine wellness examination for [place].  Hearing/Vision screen Hearing Screening - Comments:: No difficulties hearing  Vision Screening - Comments:: Patient wears glasses for far distance. She sees [facility] and eye exam    Goals Addressed             This Visit's Progress    Patient Stated       Patient would like to stay healthy        Depression Screen     03/18/2024    8:26 AM 08/17/2023    9:58 AM 02/16/2023   10:06 AM 11/24/2022   11:35 AM 11/18/2022    3:12 PM 11/10/2022   10:02 AM 10/23/2022    3:53 PM  PHQ 2/9 Scores  PHQ - 2 Score 0 1 0 0 0 0 0  PHQ- 9 Score 1 1 0 0 0 0 0    Fall Risk     03/18/2024    8:22 AM 08/17/2023    9:55 AM 02/16/2023   10:02 AM 11/24/2022   11:35 AM 11/18/2022    3:12 PM  Fall Risk   Falls in the past year? 0 1 0 0 0  Number falls in past yr: 0 0 0 0 0  Injury with Fall? 0 0 0  0 0  Risk for fall due to : No Fall Risks    No Fall Risks  Follow up Falls evaluation completed    Falls prevention discussed;Education provided;Falls evaluation completed    MEDICARE RISK AT HOME:  Medicare Risk at Home Any stairs in or around the home?: Yes If so, are there any without handrails?: No Home free of loose throw rugs in walkways, pet beds, electrical cords, etc?: Yes Adequate lighting in your home to reduce risk of falls?: Yes Life alert?: No Use of a cane, walker or w/c?: No Grab bars in the bathroom?: Yes Shower chair or bench in shower?: Yes Elevated toilet seat or a handicapped toilet?: No  TIMED UP AND GO:  Was the test performed?  No  Cognitive Function: 6CIT completed    05/05/2022    1:46 PM  MMSE - Mini Mental State Exam  Orientation to time 4  Orientation to Place 5  Registration 3  Attention/ Calculation 5  Recall 3  Language- name 2 objects 2  Language- repeat 1  Language- follow 3 step command 3  Language- read & follow direction 1  Write a sentence 1  Copy design 1  Total score 29        03/18/2024    8:21 AM 11/18/2022    3:32 PM  6CIT Screen  What Year? 0 points 0 points  What month? 0 points 0 points  What time? 0 points 0 points  Count back from 20 0 points 0 points  Months in reverse 0 points 0 points  Repeat phrase 0 points 0 points  Total Score 0 points 0 points    Immunizations Immunization History  Administered Date(s) Administered   Fluad Quad(high Dose 65+) 08/11/2022   Fluad Trivalent(High Dose 65+) 08/17/2023   Influenza, High Dose Seasonal PF 07/07/2017   Influenza-Unspecified 07/04/2014   PFIZER(Purple Top)SARS-COV-2 Vaccination 11/23/2019, 12/14/2019   PNEUMOCOCCAL CONJUGATE-20 08/11/2022   Pneumococcal Conjugate-13 05/26/2017   Pneumococcal Polysaccharide-23 03/22/2008   Tdap 07/04/2014   Zoster, Live 07/04/2014    Screening Tests Health Maintenance  Topic Date Due   Zoster Vaccines- Shingrix (1 of 2)  03/22/1960   COVID-19 Vaccine (3 - Pfizer risk series) 01/11/2020   INFLUENZA VACCINE  05/27/2024   DTaP/Tdap/Td (2 - Td or Tdap) 07/04/2024   Medicare Annual Wellness (AWV)  03/18/2025   Pneumonia Vaccine 45+ Years old  Completed   DEXA SCAN  Completed   HPV VACCINES  Aged Out   Meningococcal B Vaccine  Aged Out   Colonoscopy  Discontinued   Hepatitis C Screening  Discontinued    Health Maintenance  Health Maintenance Due  Topic Date Due   Zoster Vaccines- Shingrix (1 of 2) [birth year]22/1960   COVID-19 Vaccine (3 - Pfizer risk series) 01/11/2020   Health Maintenance Items Addressed:patient declined vaccinations  Additional Screening:  Vision Screening: Recommended annual ophthalmology exams for early detection of glaucoma and other disorders of the eye.  Dental Screening: Recommended annual dental exams for proper oral hygiene  Community Resource Referral / Chronic Care Management: CRR required this visit?  No   CCM required this visit?  No   Plan:    I have personally reviewed and noted the following in the patient's chart:   Medical and social history Use of alcohol, tobacco or illicit drugs  Current medications and supplements including opioid prescriptions. Patient is not currently taking opioid prescriptions. Functional ability and status Nutritional status Physical activity  Advanced directives List of other physicians Hospitalizations, surgeries, and ER visits in previous 12 months Vitals Screenings to include cognitive, depression, and falls Referrals and appointments  In addition, I have reviewed and discussed with patient certain preventive protocols, quality metrics, and best practice recommendations. A written personalized care plan for preventive services as well as general preventive health recommendations were provided to patient.   Freeda Jerry, [place]   03/18/2024   After Visit Summary: (MyChart) Due to this being a telephonic visit, the after  visit summary with patients personalized plan was offered to patient via MyChart   Notes: Nothing significant to report at this time.

## 2024-03-22 ENCOUNTER — Ambulatory Visit: Admitting: Physical Therapy

## 2024-03-23 ENCOUNTER — Ambulatory Visit: Admitting: Physical Therapy

## 2024-03-28 ENCOUNTER — Ambulatory Visit: Admitting: Physical Therapy

## 2024-03-28 ENCOUNTER — Ambulatory Visit: Attending: Neurology | Admitting: Physical Therapy

## 2024-03-28 DIAGNOSIS — R262 Difficulty in walking, not elsewhere classified: Secondary | ICD-10-CM | POA: Diagnosis not present

## 2024-03-28 DIAGNOSIS — R2681 Unsteadiness on feet: Secondary | ICD-10-CM | POA: Insufficient documentation

## 2024-03-28 DIAGNOSIS — R42 Dizziness and giddiness: Secondary | ICD-10-CM | POA: Insufficient documentation

## 2024-03-28 DIAGNOSIS — M6281 Muscle weakness (generalized): Secondary | ICD-10-CM | POA: Diagnosis not present

## 2024-03-28 NOTE — Therapy (Signed)
 OUTPATIENT PHYSICAL THERAPY NEURO TREATMENT   Patient Name: Rebecca Faulkner MRN: [id number] DOB:[birth year], 83 y.o., female Today's Date: 03/28/2024   PCP: Rockney Cid, DO REFERRING PROVIDER: Bufford Carne, MD  END OF SESSION:   PT End of Session - 03/28/24 0851     Visit Number 6    Number of Visits 25    Date for PT Re-Evaluation 05/02/24    PT Start Time 0850    PT Stop Time 0935    PT Time Calculation (min) 45 min    Equipment Utilized During Treatment Gait belt    Activity Tolerance Patient tolerated treatment well;No increased pain    Behavior During Therapy [facility] for tasks assessed/performed                 Past Medical History:  Diagnosis Date   Breast cancer (HCC) 2013   right breast   Heart murmur 1990   Hypercholesteremia    Lump or mass in breast    Malignant neoplasm of upper-outer quadrant of female breast (HCC) 2013   R-breast T1, NO, MO, ER/PR pos., Her 2 neg.   Osteoporosis    Ulcer 1974   Past Surgical History:  Procedure Laterality Date   BREAST SURGERY Right 2013   CATARACT EXTRACTION W/PHACO Right 04/23/2016   Procedure: CATARACT EXTRACTION PHACO AND INTRAOCULAR LENS PLACEMENT (IOC) RIGHT EYE;  Surgeon: Annell Kidney, MD;  Location: [facility];  Service: Ophthalmology;  Laterality: Right;   CATARACT EXTRACTION W/PHACO Left 06/04/2016   Procedure: CATARACT EXTRACTION PHACO AND INTRAOCULAR LENS PLACEMENT (IOC);  Surgeon: Annell Kidney, MD;  Location: [facility];  Service: Ophthalmology;  Laterality: Left;   CESAREAN SECTION  1969   COLONOSCOPY WITH PROPOFOL  N/A 07/22/2017   Procedure: COLONOSCOPY WITH PROPOFOL ;  Surgeon: Marshall Skeeter, MD;  Location: [facility];  Service: Endoscopy;  Laterality: N/A;   FRACTURE SURGERY Left 2011   arm, plate and 12 screws   LEG SURGERY Right 01/02/2015   MASTECTOMY Right 2013   Right inguinal hernia repair  2013   VEIN SURGERY Left 2015   Patient Active  Problem List   Diagnosis Date Noted   History of diverticulitis 03/12/2020   Left ovarian cyst 03/12/2020   Atherosclerosis of aorta (HCC) 03/12/2020   Osteoporosis 08/05/2019   Closed comminuted intertrochanteric fracture of proximal femur (HCC) 07/27/2018   Mass of upper outer quadrant of left breast 06/01/2018   Goals of care, counseling/discussion 01/31/2018   Osteoporosis without current pathological fracture 07/28/2017   Depression, major, recurrent, mild (HCC) 07/07/2017   B12 deficiency 07/07/2017   Vitamin D  deficiency 07/07/2017   Screening for colon cancer 05/27/2017   Malignant neoplasm of upper-outer quadrant of right breast in female, estrogen receptor positive (HCC) 05/27/2017   Glaucoma 05/26/2017   History of alcoholism (HCC) 05/26/2017   History of hip fracture 05/10/2015   Breast cancer, right (HCC) 05/27/2012   Estrogen receptor positive status (ER+) 05/14/2012    ONSET DATE: 5 years ago  REFERRING DIAG:  R26.89 (ICD-10-CM) - Imbalance  G62.9 (ICD-10-CM) - Neuropathy    THERAPY DIAG:   Unsteadiness on feet  Dizziness and giddiness  Muscle weakness (generalized)  Difficulty in walking, not elsewhere classified  Rationale for Evaluation and Treatment: Rehabilitation  SUBJECTIVE:  SUBJECTIVE STATEMENT:  Pt reports she has been doing "fine, I can't complain." Pt states "I live in pain, but I'm OK."   Neurology MD apt from 03/14/2024: "Reviewed results of recent Brain MRI from 01/18/2024 in the office today. Results indicated mild ventriculomegaly with narrowing of the callosal angle. Symptoms indeterminate between normal pressure hydrocephalus and age-related changes at this time. Will continue to monitor at future visits. "  Pt states she doesn't want to have a shunt put  into her brain.   Pt reports she will be unavailable to come to therapy for approximately 2 weeks in June because she is going to family events (graduation and wedding). States she leaves next Thursday, June 12th and wont be back until July 1st. Pt reports she will have more time during that period to work on a more advanced HEP    Initial Eval: Pt is a pleasant 83 y/o female presenting to PT for imbalance. Pt ambulating with a SPC. She states she initially thought she had vertigo when she started losing her balance where onset of imbalance was about 5 years ago. Pt noticed imbalance with carrying tea while ambulating; she felt she was going to fall/had someone help her. Pt reports she still gets dizzy off and on. She describes it as "weak" and "sometimes the room moves." When she had an MRI recently she saw the room spin. Pt reports no falls in the last six months, but she has had near-falls where she grabs onto stuff.  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 She has neuropathy affecting bilat LE.  Pt accompanied by: friend Counsellor)  PERTINENT HISTORY: Per chart PMH significant for breast CA (R), hx hip fx, hx alcoholism, depression, B12 deficiency, Vitamin D  deficiency, osteoporosis without current pathological fx, mass of upper quadrant L breast, closed comminuted intertrochanteric fx of proximal femur, atherosclerosis of aorta, new diagnosis of normal pressure hydrocephalus  PAIN:  Are you having pain? General arthritis pain; she reports she takes Advil to help her pain  PRECAUTIONS: Fall, normal pressure hydrocephalus  RED FLAGS: Pt states she wears a diaper due to incontinence and says physician aware   WEIGHT BEARING RESTRICTIONS: No  FALLS: Has patient fallen in last 6 months? No  LIVING ENVIRONMENT: Lives with: lives alone Lives in: House/apartment Stairs: 7 steps to get into home, bilat rails Has following  equipment at home: Single point cane, Grab bars, and grab bar in tub but does not use anymore  PLOF: indep with ADLs at home but is getting some help at work  PATIENT GOALS: she wants to walk without a cane; I don't want to end up in a wheelchair   OBJECTIVE:  Note: Objective measures were completed at Evaluation unless otherwise noted.  DIAGNOSTIC FINDINGS:  EXAM 01/18/2024: MRI HEAD WITHOUT CONTRAST IMPRESSION: 1. Mild ventriculomegaly with narrowing of the callosal angle. Findings can be seen with normal pressure hydrocephalus in the appropriate clinical setting. 2. Chronic small vessel changes. 3. No acute infarct.     Electronically Signed   By: Johnanna Mylar M.D.   On: 02/10/2024 11:19  CT HEAD 10/20/22: "FINDINGS: Brain: No acute infarct or hemorrhage. Lateral ventricles and midline structures are stable. No acute extra-axial fluid collections. No mass effect.   Vascular: No hyperdense vessel or unexpected calcification.   Skull: Normal. Negative for fracture or focal lesion.   Sinuses/Orbits: Mild mucoperiosteal thickening throughout  the ethmoid air cells. Remaining paranasal sinuses are clear.   Other: None.   IMPRESSION: 1. Ethmoid sinus disease. 2. No acute intracranial process.     Electronically Signed   By: Bobbye Burrow M.D.   On: 10/20/2022 16:14"  COGNITION: Overall cognitive status: Within functional limits for tasks assessed   SENSATION: Pt has neuropathy affecting bilat LE and RUE; she describes sensation as numbness  COORDINATION: WFL BLE and BUE with rapid alt movement, chin<>target; shin>contrlat LE   EDEMA:  She reports every now and then she gets some swelling in BLE has been ongoing 4 years /not new   POSTURE: rounded shoulders, increased thoracic kyphosis    LOWER EXTREMITY MMT:   Grossly 4/5 bilat LE most deficits found proximal musculature   BED MOBILITY:  Rolling makes pt lightheaded   TRANSFERS: Able to complete  hands-free in session with SBA  RAMP:  impaired confidence in balance on ramps per ABC questionnaire    STAIRS:  impaired confidence in balance on stairs per ABC questionnaire   GAIT: See below: decreased gait speed, uses SPC, close CGA without AD, shuffling-like gait/decreased step length and decreased heel strike, limited arm-swing and hip extension bilat, L lateral sway in mid-stance  FUNCTIONAL TESTS:  5 times sit to stand: 13 sec, hands-free  Timed up and go (TUG): 19,5 sec no AD; 14 sec with SPC  10 meter walk test: 0.61 m/s with [facility] Berg Balance Scale: deferred    PATIENT SURVEYS:  ABC scale 58.75%  Vestibular Assessment from 02/22/2024:  OCULOMOTOR EXAM:  Ocular Alignment: normal, some skin drooping around R eye impacting alignment slightly, but relatively symmetrical  Ocular ROM: No Limitations  Spontaneous Nystagmus: absent  Gaze-Induced Nystagmus: absent  Smooth Pursuits: intact  Saccades: slow  Convergence/Divergence: appears normal  Cover/Cross-Cover: WNL  VESTIBULAR - OCULAR REFLEX:   Slow VOR: Normal  VOR Cancellation: Normal, when having pt perform it herself she moved her head more quickly and she said she felt "a little lightheadedness"  Head-Impulse Test: unable to test due to pt guarding  Dynamic Visual Acuity: to be tested if needed  POSITIONAL TESTING: Right Dix-Hallpike: upbeating, right nystagmus and Duration: <30 seconds Right Sidelying: symptomatic upon return to sitting upright Left Sidelying: no nystagmus and no symptoms  MOTION SENSITIVITY: to be tested if needed  Motion Sensitivity Quotient                                                                                                                              TREATMENT DATE: 03/28/2024  Pt ambulates in/out therapy clinic using hurricane with very short, shuffled gait; however, pt cuing herself to take larger steps and is less guarded in her upper body. Possible slight improvement  in her gait speed. Close SBA provided for patient safety and she uses her friend's support as needed.   Gait training including:   2x ~173ft (seated rest break between) no UE support, with CGA/  light min A for steadying  Cuing for longer step lengths and increased gait speed  Continues to have more shuffled step with L LE compared to R LE Pt also improving arm swing, but still in partial guarded posture Pt does best when counting "1,2,3,4" on repeat to time her steps Sideways stepping ~10-75ft down/back x2 reps No UE support with CGA for steadying/safety No LOB, cuing not to turn towards L when stepping in that direction   Dynamic stepping balance interventions at balance bar for pt safety and use as needed:  Forward stepping into partial lunge x10reps per side holding onto balance bar with 1 UE support Added to pt's HEP and educated on UE to hold onto at counter for balance support Demos R hip weakness when stepping forward with L LE (R LE is the one she had femur fx) CGA/close SBA for safety Forward/backwards stepping over 1/2 foam roll x 10 reps per LE while holding on with L UE support Progressed to x3 reps per LE without holding on and only using intermittent UE support on bar for balance Requires light min A for safety/steadying  Pt very fearful of falling with this, benefiting from encouragement and cuing on sequencing   Standing balance interventions including: NBOS 1x 30 sec 1/2 tandem stance x30sec each LE No significant sway with this either, but pt did report it as challenging Added to pt's HEP   Updated pt's HEP as described below to progress it in preparation for pt to perform while she is traveling out of town.     PATIENT EDUCATION: Education details: assessment findings, plan, goals Person educated: Patient and pt's friend present for eval  Education method: Explanation Education comprehension: verbalized understanding  HOME EXERCISE PROGRAM:  Access Code:  [email or web address] URL: [email or web address] Date: 03/28/2024 Prepared by: Carlen Chasten  Exercises - Sit to Stand with Counter Support  - 1 x daily - 7 x weekly - 2 sets - 10 reps - Side Stepping with Counter Support  - 1 x daily - 7 x weekly - 3 sets - 10 reps - Lunge with Counter Support  - 1 x daily - 3-5 x weekly - 2 sets - 10 reps - Narrow Stance with Unilateral Counter Support  - 1 x daily - 7 x weekly - 2 sets - 30 seconds hold - Standing Romberg to 1/2 Tandem Stance  - 1 x daily - 7 x weekly - 2 sets - 30 seconds hold   GOALS: Goals reviewed with patient? Yes  SHORT TERM GOALS: Target date: 03/21/2024    Patient will be independent in home exercise program to improve strength/mobility for better functional independence with ADLs. Baseline: initiated on 03/07/2024, updated on 03/28/2024 Goal status: INITIAL   LONG TERM GOALS: Target date: 05/02/2024    Patient will increase ABC scale score >80% to demonstrate better functional mobility and better confidence with ADLs.  Baseline: 58.75% Goal status: INITIAL  2.  Patient will complete five times sit to stand test in < 10 seconds indicating an increased LE strength and improved balance. Baseline: 13 sec Goal status: INITIAL  3.  Patient will increase Berg Balance score by > 6 points to demonstrate decreased fall risk during functional activities Baseline: 02/29/2024: 36/56 Goal status: INITIAL  4.  Patient will increase 10 meter walk test to >1.42m/s as to improve gait speed for better community ambulation and to reduce fall risk. Baseline: 0.61 m/s with SPC Goal status: INITIAL  5.  Patient will reduce timed up  and go to <11 seconds to reduce fall risk and demonstrate improved transfer/gait ability. Baseline: 19.5 sec no AD, 14 sec with SPC Goal status: INITIAL    ASSESSMENT:  CLINICAL IMPRESSION:  Patient is a pleasant 83 y.o. female who was seen today for physical therapy treatment for imbalance and  neuropathy. Therapy session focused on progression of dynamic gait training without UE support targeting increased step lengths and gait speed as well as sideways stepping. Patient also participated in dynamic stepping balance interventions over slightly larger obstacle with pt feeling unsure of performing task when not using UE support for balance. Progressed static standing balance interventions to 1/2 tandem with pt still reporting feeling off balance with this and slight sway noted. Therapist progressed pt's HEP to perform this week and provide feedback on to ensure she feels safe and confident performing it on her upcoming trip. Ms. Fetters will benefit from further skilled PT to improve impairments in order to decrease fall risk, and increase QOL and ease and safety with ADLs.   OBJECTIVE IMPAIRMENTS: Abnormal gait, decreased activity tolerance, decreased balance, decreased mobility, difficulty walking, decreased ROM, decreased strength, dizziness, impaired sensation, impaired UE functional use, improper body mechanics, postural dysfunction, and pain.   ACTIVITY LIMITATIONS: carrying, lifting, bending, standing, squatting, stairs, transfers, bed mobility, bathing, and locomotion level  PARTICIPATION LIMITATIONS: meal prep, cleaning, shopping, community activity, occupation, and yard work  PERSONAL FACTORS: Age, Fitness, Sex, Time since onset of injury/illness/exacerbation, and 3+ comorbidities: Per chart PMH significant for breast CA (R), hx hip fx, hx alcoholism, depression, B12 deficiency, Vitamin D  deficiency, osteoporosis without current pathological fx, mass of upper quadrant L breast, closed comminuted intertrochanteric fx of proximal femur, atherosclerosis of aorta are also affecting patient's functional outcome.   REHAB POTENTIAL: Good  CLINICAL DECISION MAKING: Evolving/moderate complexity  EVALUATION COMPLEXITY: Moderate  PLAN:  PT FREQUENCY: 1-2x/week  PT DURATION: 12  weeks  PLANNED INTERVENTIONS: 97164- PT Re-evaluation, 97750- Physical Performance Testing, 97110-Therapeutic exercises, 97530- Therapeutic activity, [id number]- Neuromuscular re-education, 97535- Self Care, 16109- Manual therapy, [id number]- Gait training, [phone]- Orthotic Initial, [phone]- Orthotic/Prosthetic subsequent, [phone]- Canalith repositioning, [phone]- Splinting, [phone]- Electrical stimulation (manual), Patient/Family education, Balance training, Stair training, Taping, Joint mobilization, Spinal mobilization, Vestibular training, DME instructions, Wheelchair mobility training, Cryotherapy, and Moist heat  PLAN FOR NEXT SESSION:  - follow-up on upgraded HEP   - maybe add alternating foot taps at counter for additional challenge? - standing balance:  - reaching activities   - turning 360 degrees  - alternating foot taps   - progression to semi-tandem  - fwd/back & side stepping over obstacles - dynamic gait training  - need to include turning more    Carlen Chasten, PT, DPT, NCS, CSRS Physical Therapist - [facility]  [facility]  9:42 AM 03/28/24

## 2024-04-04 ENCOUNTER — Ambulatory Visit: Admitting: Physical Therapy

## 2024-04-04 DIAGNOSIS — R42 Dizziness and giddiness: Secondary | ICD-10-CM | POA: Diagnosis not present

## 2024-04-04 DIAGNOSIS — R2681 Unsteadiness on feet: Secondary | ICD-10-CM

## 2024-04-04 DIAGNOSIS — R262 Difficulty in walking, not elsewhere classified: Secondary | ICD-10-CM | POA: Diagnosis not present

## 2024-04-04 DIAGNOSIS — M6281 Muscle weakness (generalized): Secondary | ICD-10-CM | POA: Diagnosis not present

## 2024-04-04 NOTE — Therapy (Signed)
 OUTPATIENT PHYSICAL THERAPY NEURO TREATMENT   Patient Name: Rebecca Faulkner MRN: [id number] DOB:1940-12-29, 83 y.o., female Today's Date: 04/04/2024   PCP: Rockney Cid, DO REFERRING PROVIDER: Bufford Carne, MD  END OF SESSION:   PT End of Session - 04/04/24 1012     Visit Number 7    Number of Visits 25    Date for PT Re-Evaluation 05/02/24    PT Start Time 1015    PT Stop Time 1057    PT Time Calculation (min) 42 min    Equipment Utilized During Treatment Gait belt    Activity Tolerance Patient tolerated treatment well;No increased pain    Behavior During Therapy [facility] for tasks assessed/performed              Past Medical History:  Diagnosis Date   Breast cancer (HCC) 2013   right breast   Heart murmur 1990   Hypercholesteremia    Lump or mass in breast    Malignant neoplasm of upper-outer quadrant of female breast (HCC) 2013   R-breast T1, NO, MO, ER/PR pos., Her 2 neg.   Osteoporosis    Ulcer 1974   Past Surgical History:  Procedure Laterality Date   BREAST SURGERY Right 2013   CATARACT EXTRACTION W/PHACO Right 04/23/2016   Procedure: CATARACT EXTRACTION PHACO AND INTRAOCULAR LENS PLACEMENT (IOC) RIGHT EYE;  Surgeon: Annell Kidney, MD;  Location: [facility];  Service: Ophthalmology;  Laterality: Right;   CATARACT EXTRACTION W/PHACO Left 06/04/2016   Procedure: CATARACT EXTRACTION PHACO AND INTRAOCULAR LENS PLACEMENT (IOC);  Surgeon: Annell Kidney, MD;  Location: [facility];  Service: Ophthalmology;  Laterality: Left;   CESAREAN SECTION  1969   COLONOSCOPY WITH PROPOFOL  N/A 07/22/2017   Procedure: COLONOSCOPY WITH PROPOFOL ;  Surgeon: Marshall Skeeter, MD;  Location: [facility];  Service: Endoscopy;  Laterality: N/A;   FRACTURE SURGERY Left 2011   arm, plate and 12 screws   LEG SURGERY Right 01/02/2015   MASTECTOMY Right 2013   Right inguinal hernia repair  2013   VEIN SURGERY Left 2015   Patient Active Problem List    Diagnosis Date Noted   History of diverticulitis 03/12/2020   Left ovarian cyst 03/12/2020   Atherosclerosis of aorta (HCC) 03/12/2020   Osteoporosis 08/05/2019   Closed comminuted intertrochanteric fracture of proximal femur (HCC) 07/27/2018   Mass of upper outer quadrant of left breast 06/01/2018   Goals of care, counseling/discussion 01/31/2018   Osteoporosis without current pathological fracture 07/28/2017   Depression, major, recurrent, mild (HCC) 07/07/2017   B12 deficiency 07/07/2017   Vitamin D  deficiency 07/07/2017   Screening for colon cancer 05/27/2017   Malignant neoplasm of upper-outer quadrant of right breast in female, estrogen receptor positive (HCC) 05/27/2017   Glaucoma 05/26/2017   History of alcoholism (HCC) 05/26/2017   History of hip fracture 05/10/2015   Breast cancer, right (HCC) 05/27/2012   Estrogen receptor positive status (ER+) 05/14/2012    ONSET DATE: 5 years ago  REFERRING DIAG:  R26.89 (ICD-10-CM) - Imbalance  G62.9 (ICD-10-CM) - Neuropathy    THERAPY DIAG:   Unsteadiness on feet  Dizziness and giddiness  Muscle weakness (generalized)  Difficulty in walking, not elsewhere classified  Rationale for Evaluation and Treatment: Rehabilitation  SUBJECTIVE:  SUBJECTIVE STATEMENT:  Pt reports overall she is doing good. Denies stumbles/falls since last session. Pt reports she hasn't tried her HEP yet because she thought it was for her to do on her upcoming trip. Also, pt reports she has been too tired after work to perform it.  Reports she will be unavailable to come to therapy until July 7th because she is going to family events (graduation and a wedding). States she leaves Thursday, June 12th and wont be back until July 1st. Pt reports she will have more time  during that period to work on her advanced HEP    Initial Eval: Pt is a pleasant 83 y/o female presenting to PT for imbalance. Pt ambulating with a SPC. She states she initially thought she had vertigo when she started losing her balance where onset of imbalance was about 5 years ago. Pt noticed imbalance with carrying tea while ambulating; she felt she was going to fall/had someone help her. Pt reports she still gets dizzy off and on. She describes it as "weak" and "sometimes the room moves." When she had an MRI recently she saw the room spin. Pt reports no falls in the last six months, but she has had near-falls where she grabs onto stuff.  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 She has neuropathy affecting bilat LE.  Pt accompanied by: friend Counsellor)  PERTINENT HISTORY: Per chart PMH significant for breast CA (R), hx hip fx, hx alcoholism, depression, B12 deficiency, Vitamin D  deficiency, osteoporosis without current pathological fx, mass of upper quadrant L breast, closed comminuted intertrochanteric fx of proximal femur, atherosclerosis of aorta, new diagnosis of normal pressure hydrocephalus  Neurology MD apt from 03/14/2024: "Reviewed results of recent Brain MRI from 01/18/2024 in the office today. Results indicated mild ventriculomegaly with narrowing of the callosal angle. Symptoms indeterminate between normal pressure hydrocephalus and age-related changes at this time. Will continue to monitor at future visits. "  Pt states she doesn't want to have a shunt put into her brain.  PAIN:  Are you having pain? General arthritis pain; she reports she takes Advil to help her pain  PRECAUTIONS: Fall, normal pressure hydrocephalus  RED FLAGS: Pt states she wears a diaper due to incontinence and says physician aware   WEIGHT BEARING RESTRICTIONS: No  FALLS: Has patient fallen in last 6 months? No  LIVING  ENVIRONMENT: Lives with: lives alone Lives in: House/apartment Stairs: 7 steps to get into home, bilat rails Has following equipment at home: Single point cane, Grab bars, and grab bar in tub but does not use anymore  PLOF: indep with ADLs at home but is getting some help at work  PATIENT GOALS: she wants to walk without a cane; I don't want to end up in a wheelchair   OBJECTIVE:  Note: Objective measures were completed at Evaluation unless otherwise noted.  DIAGNOSTIC FINDINGS:  EXAM 01/18/2024: MRI HEAD WITHOUT CONTRAST IMPRESSION: 1. Mild ventriculomegaly with narrowing of the callosal angle. Findings can be seen with normal pressure hydrocephalus in the appropriate clinical setting. 2. Chronic small vessel changes. 3. No acute infarct.     Electronically Signed   By: Johnanna Mylar M.D.   On: 02/10/2024 11:19  CT HEAD 10/20/22: "FINDINGS: Brain: No acute infarct or hemorrhage. Lateral ventricles and midline structures are stable. No acute extra-axial fluid collections. No mass effect.   Vascular: No hyperdense vessel  or unexpected calcification.   Skull: Normal. Negative for fracture or focal lesion.   Sinuses/Orbits: Mild mucoperiosteal thickening throughout the ethmoid air cells. Remaining paranasal sinuses are clear.   Other: None.   IMPRESSION: 1. Ethmoid sinus disease. 2. No acute intracranial process.     Electronically Signed   By: Bobbye Burrow M.D.   On: 10/20/2022 16:14"  COGNITION: Overall cognitive status: Within functional limits for tasks assessed   SENSATION: Pt has neuropathy affecting bilat LE and RUE; she describes sensation as numbness  COORDINATION: WFL BLE and BUE with rapid alt movement, chin<>target; shin>contrlat LE   EDEMA:  She reports every now and then she gets some swelling in BLE has been ongoing 4 years /not new   POSTURE: rounded shoulders, increased thoracic kyphosis    LOWER EXTREMITY MMT:   Grossly 4/5 bilat LE  most deficits found proximal musculature   BED MOBILITY:  Rolling makes pt lightheaded   TRANSFERS: Able to complete hands-free in session with SBA  RAMP:  impaired confidence in balance on ramps per ABC questionnaire    STAIRS:  impaired confidence in balance on stairs per ABC questionnaire   GAIT: See below: decreased gait speed, uses SPC, close CGA without AD, shuffling-like gait/decreased step length and decreased heel strike, limited arm-swing and hip extension bilat, L lateral sway in mid-stance  FUNCTIONAL TESTS:  5 times sit to stand: 13 sec, hands-free  Timed up and go (TUG): 19,5 sec no AD; 14 sec with SPC  10 meter walk test: 0.61 m/s with [facility] Berg Balance Scale: deferred    PATIENT SURVEYS:  ABC scale 58.75%  Vestibular Assessment from 02/22/2024:  OCULOMOTOR EXAM:  Ocular Alignment: normal, some skin drooping around R eye impacting alignment slightly, but relatively symmetrical  Ocular ROM: No Limitations  Spontaneous Nystagmus: absent  Gaze-Induced Nystagmus: absent  Smooth Pursuits: intact  Saccades: slow  Convergence/Divergence: appears normal  Cover/Cross-Cover: WNL  VESTIBULAR - OCULAR REFLEX:   Slow VOR: Normal  VOR Cancellation: Normal, when having pt perform it herself she moved her head more quickly and she said she felt "a little lightheadedness"  Head-Impulse Test: unable to test due to pt guarding  Dynamic Visual Acuity: to be tested if needed  POSITIONAL TESTING: Right Dix-Hallpike: upbeating, right nystagmus and Duration: <30 seconds Right Sidelying: symptomatic upon return to sitting upright Left Sidelying: no nystagmus and no symptoms  MOTION SENSITIVITY: to be tested if needed  Motion Sensitivity Quotient                                                                                                                              TREATMENT DATE: 04/04/2024  Pt ambulates in therapy clinic using hurricane with very short, shuffled  gait, and decreased gait speed. Close SBA provided for patient safety and she uses her friend's support as needed.   Gait training including:   ~137ft, no UE support, with CGA/ light min A for  steadying  Cuing for longer step lengths and increased gait speed  Continues to have more shuffled step with L LE compared to R LE Pt also improving arm swing, but still in partial guarded posture with increased elbow flexion Pt does best when repeatedly counting "1,2,3,4" to time her steps Sideways stepping ~10-5ft down/back x3 reps No UE support with CGA for steadying/safety No LOB, cuing not to turn towards L when stepping in that direction   Reviewed HEP to ensure pt understanding of the exercises prior to her leaving for vacation and taking ~4 weeks off from therapy:  Sit to stand x10 reps Cuing for increased anterior lean to improve coming to stand Didn't repeat the side stepping at counter because performed it as part of gait training Forward partial lunges x10 each LE with unilateral UE support on counter Pt able to demonstrate safe, proper technique Standing NBOS x30sec Standing 1/2 tandem x30 sec each LE More difficulty with L foot forward  Pt reports no questions/concerns regarding previously prescribed HEP.    B LE functional strengthening and dynamic balance interventions as follows: Alternating foot taps to green step with 1x purple plate Wearing 2lb AW on each LE x10 reps per LE  Forward step-ups onto green step with 1x purple plate 2lb AW on each LE  2x 10 reps each LE Using R UE support on counter for stability and balance CGA/light min A for steadying/balance Decreased powering up through R glutes compared to L LE Side stepping down/back x10 steps each direction x3 laps RTB around knees CGA/light min A for steadying   Gait training additional ~71ft, no AD, with CGA/light min A for steadying with cuing to carryover improved step lengths with pt demonstrating  significant improvement!    PATIENT EDUCATION: Education details: assessment findings, plan, goals Person educated: Patient and pt's friend present for eval  Education method: Explanation Education comprehension: verbalized understanding  HOME EXERCISE PROGRAM:  Access Code: [email or web address] URL: [email or web address] Date: 03/28/2024 Prepared by: Carlen Chasten  Exercises - Sit to Stand with Counter Support  - 1 x daily - 7 x weekly - 2 sets - 10 reps - Side Stepping with Counter Support  - 1 x daily - 7 x weekly - 3 sets - 10 reps - Lunge with Counter Support  - 1 x daily - 3-5 x weekly - 2 sets - 10 reps - Narrow Stance with Unilateral Counter Support  - 1 x daily - 7 x weekly - 2 sets - 30 seconds hold - Standing Romberg to 1/2 Tandem Stance  - 1 x daily - 7 x weekly - 2 sets - 30 seconds hold   GOALS: Goals reviewed with patient? Yes  SHORT TERM GOALS: Target date: 03/21/2024    Patient will be independent in home exercise program to improve strength/mobility for better functional independence with ADLs. Baseline: initiated on 03/07/2024, updated on 03/28/2024 Goal status: INITIAL   LONG TERM GOALS: Target date: 05/02/2024    Patient will increase ABC scale score >80% to demonstrate better functional mobility and better confidence with ADLs.  Baseline: 58.75% Goal status: INITIAL  2.  Patient will complete five times sit to stand test in < 10 seconds indicating an increased LE strength and improved balance. Baseline: 13 sec Goal status: INITIAL  3.  Patient will increase Berg Balance score by > 6 points to demonstrate decreased fall risk during functional activities Baseline: 02/29/2024: 36/56 Goal status: INITIAL  4.  Patient will increase 10 meter walk test  to >1.22m/s as to improve gait speed for better community ambulation and to reduce fall risk. Baseline: 0.61 m/s with SPC Goal status: INITIAL  5.  Patient will reduce timed up and go to <11 seconds to  reduce fall risk and demonstrate improved transfer/gait ability. Baseline: 19.5 sec no AD, 14 sec with SPC Goal status: INITIAL    ASSESSMENT:  CLINICAL IMPRESSION:  Patient is a pleasant 83 y.o. female who was seen today for physical therapy treatment for imbalance and neuropathy. Therapy session focused on progression of dynamic gait training without UE support targeting increased step lengths and gait speed as well as sideways stepping. Therapist reviewed pt's updated HEP to ensure she feels safe and confident performing it on her upcoming trip. Patient also participated in dynamic stepping balance and B LE functional strengthening interventions using green step with 1x purple plate and only R UE support. Patient demonstrates decreased R LE glute strength compared to L LE.  Ms. Gracey will benefit from further skilled PT to improve impairments in order to decrease fall risk, and increase QOL and ease and safety with ADLs.   OBJECTIVE IMPAIRMENTS: Abnormal gait, decreased activity tolerance, decreased balance, decreased mobility, difficulty walking, decreased ROM, decreased strength, dizziness, impaired sensation, impaired UE functional use, improper body mechanics, postural dysfunction, and pain.   ACTIVITY LIMITATIONS: carrying, lifting, bending, standing, squatting, stairs, transfers, bed mobility, bathing, and locomotion level  PARTICIPATION LIMITATIONS: meal prep, cleaning, shopping, community activity, occupation, and yard work  PERSONAL FACTORS: Age, Fitness, Sex, Time since onset of injury/illness/exacerbation, and 3+ comorbidities: Per chart PMH significant for breast CA (R), hx hip fx, hx alcoholism, depression, B12 deficiency, Vitamin D  deficiency, osteoporosis without current pathological fx, mass of upper quadrant L breast, closed comminuted intertrochanteric fx of proximal femur, atherosclerosis of aorta are also affecting patient's functional outcome.   REHAB POTENTIAL:  Good  CLINICAL DECISION MAKING: Evolving/moderate complexity  EVALUATION COMPLEXITY: Moderate  PLAN:  PT FREQUENCY: 1-2x/week  PT DURATION: 12 weeks  PLANNED INTERVENTIONS: [id number]- PT Re-evaluation, 97750- Physical Performance Testing, 97110-Therapeutic exercises, 97530- Therapeutic activity, [id number]- Neuromuscular re-education, 97535- Self Care, [id number]- Manual therapy, [id number]- Gait training, [phone]- Orthotic Initial, [phone]- Orthotic/Prosthetic subsequent, [phone]- Canalith repositioning, [phone]- Splinting, [phone]- Electrical stimulation (manual), Patient/Family education, Balance training, Stair training, Taping, Joint mobilization, Spinal mobilization, Vestibular training, DME instructions, Wheelchair mobility training, Cryotherapy, and Moist heat    PLAN FOR NEXT SESSION:  *RECERT*  - follow-up on how HEP went during her trip - standing balance:  - reaching activities   - turning 360 degrees  - alternating foot taps   - progression to semi-tandem  - fwd/back & side stepping over obstacles - dynamic gait training  - need to include turning more    Marci Polito, PT, DPT, NCS, CSRS Physical Therapist - [facility]  [facility]  12:04 PM 04/04/24

## 2024-04-11 ENCOUNTER — Ambulatory Visit: Admitting: Physical Therapy

## 2024-04-18 ENCOUNTER — Ambulatory Visit: Admitting: Physical Therapy

## 2024-04-18 ENCOUNTER — Ambulatory Visit: Admitting: Podiatry

## 2024-04-25 ENCOUNTER — Ambulatory Visit: Admitting: Physical Therapy

## 2024-05-02 ENCOUNTER — Ambulatory Visit: Admitting: Physical Therapy

## 2024-05-02 DIAGNOSIS — Z961 Presence of intraocular lens: Secondary | ICD-10-CM | POA: Diagnosis not present

## 2024-05-09 ENCOUNTER — Ambulatory Visit: Attending: Neurology | Admitting: Physical Therapy

## 2024-05-09 ENCOUNTER — Ambulatory Visit: Admitting: Physical Therapy

## 2024-05-09 DIAGNOSIS — M6281 Muscle weakness (generalized): Secondary | ICD-10-CM | POA: Diagnosis not present

## 2024-05-09 DIAGNOSIS — R262 Difficulty in walking, not elsewhere classified: Secondary | ICD-10-CM | POA: Insufficient documentation

## 2024-05-09 DIAGNOSIS — R2681 Unsteadiness on feet: Secondary | ICD-10-CM | POA: Diagnosis not present

## 2024-05-09 DIAGNOSIS — R42 Dizziness and giddiness: Secondary | ICD-10-CM | POA: Diagnosis not present

## 2024-05-09 NOTE — Therapy (Signed)
 OUTPATIENT PHYSICAL THERAPY NEURO TREATMENT/RECERT    Patient Name: Rebecca Faulkner MRN: [id number] DOB:1941-01-05, 83 y.o., female Today's Date: 05/09/2024   PCP: Bernardo Fend, DO REFERRING PROVIDER: Lane Arthea BRAVO, MD  END OF SESSION:   PT End of Session - 05/09/24 0856     Visit Number 8    Number of Visits 49    Date for PT Re-Evaluation 08/01/24    PT Start Time 0851    PT Stop Time 0938    PT Time Calculation (min) 47 min    Equipment Utilized During Treatment Gait belt    Activity Tolerance Patient tolerated treatment well    Behavior During Therapy [facility] for tasks assessed/performed            Past Medical History:  Diagnosis Date   Breast cancer (HCC) 2013   right breast   Heart murmur 1990   Hypercholesteremia    Lump or mass in breast    Malignant neoplasm of upper-outer quadrant of female breast (HCC) 2013   R-breast T1, NO, MO, ER/PR pos., Her 2 neg.   Osteoporosis    Ulcer 1974   Past Surgical History:  Procedure Laterality Date   BREAST SURGERY Right 2013   CATARACT EXTRACTION W/PHACO Right 04/23/2016   Procedure: CATARACT EXTRACTION PHACO AND INTRAOCULAR LENS PLACEMENT (IOC) RIGHT EYE;  Surgeon: Dene Etienne, MD;  Location: [facility];  Service: Ophthalmology;  Laterality: Right;   CATARACT EXTRACTION W/PHACO Left 06/04/2016   Procedure: CATARACT EXTRACTION PHACO AND INTRAOCULAR LENS PLACEMENT (IOC);  Surgeon: Dene Etienne, MD;  Location: [facility];  Service: Ophthalmology;  Laterality: Left;   CESAREAN SECTION  1969   COLONOSCOPY WITH PROPOFOL  N/A 07/22/2017   Procedure: COLONOSCOPY WITH PROPOFOL ;  Surgeon: Dessa Reyes ORN, MD;  Location: [facility];  Service: Endoscopy;  Laterality: N/A;   FRACTURE SURGERY Left 2011   arm, plate and 12 screws   LEG SURGERY Right 01/02/2015   MASTECTOMY Right 2013   Right inguinal hernia repair  2013   VEIN SURGERY Left 2015   Patient Active Problem List    Diagnosis Date Noted   History of diverticulitis 03/12/2020   Left ovarian cyst 03/12/2020   Atherosclerosis of aorta (HCC) 03/12/2020   Osteoporosis 08/05/2019   Closed comminuted intertrochanteric fracture of proximal femur (HCC) 07/27/2018   Mass of upper outer quadrant of left breast 06/01/2018   Goals of care, counseling/discussion 01/31/2018   Osteoporosis without current pathological fracture 07/28/2017   Depression, major, recurrent, mild (HCC) 07/07/2017   B12 deficiency 07/07/2017   Vitamin D  deficiency 07/07/2017   Screening for colon cancer 05/27/2017   Malignant neoplasm of upper-outer quadrant of right breast in female, estrogen receptor positive (HCC) 05/27/2017   Glaucoma 05/26/2017   History of alcoholism (HCC) 05/26/2017   History of hip fracture 05/10/2015   Breast cancer, right (HCC) 05/27/2012   Estrogen receptor positive status (ER+) 05/14/2012    ONSET DATE: 5 years ago  REFERRING DIAG:  R26.89 (ICD-10-CM) - Imbalance  G62.9 (ICD-10-CM) - Neuropathy    THERAPY DIAG:   Unsteadiness on feet  Dizziness and giddiness  Muscle weakness (generalized)  Difficulty in walking, not elsewhere classified  Rationale for Evaluation and Treatment: Rehabilitation  SUBJECTIVE:  SUBJECTIVE STATEMENT:  Pt reports her vacation went well. Pt reports she didn't do her HEP consistently, but did try some of the exercises. Pt reports her thighs are achy pain and pt reports it is associated with the anticipated bad weather this afternoon. Pt reports it may also be due to being back home where she has 7 STE her house vs the camper where there were only 3 STE. No reports of falls.   Initial Eval: Pt is a pleasant 83 y/o female presenting to PT for imbalance. Pt ambulating with a SPC. She  states she initially thought she had vertigo when she started losing her balance where onset of imbalance was about 5 years ago. Pt noticed imbalance with carrying tea while ambulating; she felt she was going to fall/had someone help her. Pt reports she still gets dizzy off and on. She describes it as weak and sometimes the room moves. When she had an MRI recently she saw the room spin. Pt reports no falls in the last six months, but she has had near-falls where she grabs onto stuff.  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 She has neuropathy affecting bilat LE.  Pt accompanied by: friend Counsellor)  PERTINENT HISTORY: Per chart PMH significant for breast CA (R), hx hip fx, hx alcoholism, depression, B12 deficiency, Vitamin D  deficiency, osteoporosis without current pathological fx, mass of upper quadrant L breast, closed comminuted intertrochanteric fx of proximal femur, atherosclerosis of aorta, new diagnosis of normal pressure hydrocephalus  Neurology MD apt from 03/14/2024: Reviewed results of recent Brain MRI from 01/18/2024 in the office today. Results indicated mild ventriculomegaly with narrowing of the callosal angle. Symptoms indeterminate between normal pressure hydrocephalus and age-related changes at this time. Will continue to monitor at future visits.   Pt states she doesn't want to have a shunt put into her brain.  PAIN:  Are you having pain? General arthritis pain; she reports she takes Advil to help her pain  PRECAUTIONS: Fall, normal pressure hydrocephalus  RED FLAGS: Pt states she wears a diaper due to incontinence and says physician aware   WEIGHT BEARING RESTRICTIONS: No  FALLS: Has patient fallen in last 6 months? No  LIVING ENVIRONMENT: Lives with: lives alone Lives in: House/apartment Stairs: 7 steps to get into home, bilat rails Has following equipment at home: Single point cane, Grab bars,  and grab bar in tub but does not use anymore  PLOF: indep with ADLs at home but is getting some help at work  PATIENT GOALS: she wants to walk without a cane; I don't want to end up in a wheelchair   OBJECTIVE:  Note: Objective measures were completed at Evaluation unless otherwise noted.  DIAGNOSTIC FINDINGS:  EXAM 01/18/2024: MRI HEAD WITHOUT CONTRAST IMPRESSION: 1. Mild ventriculomegaly with narrowing of the callosal angle. Findings can be seen with normal pressure hydrocephalus in the appropriate clinical setting. 2. Chronic small vessel changes. 3. No acute infarct.     Electronically Signed   By: Clem Savory M.D.   On: 02/10/2024 11:19  CT HEAD 10/20/22: FINDINGS: Brain: No acute infarct or hemorrhage. Lateral ventricles and midline structures are stable. No acute extra-axial fluid collections. No mass effect.   Vascular: No hyperdense vessel or unexpected calcification.   Skull: Normal. Negative for fracture or focal lesion.   Sinuses/Orbits: Mild mucoperiosteal thickening throughout the ethmoid air cells. Remaining paranasal sinuses are  clear.   Other: None.   IMPRESSION: 1. Ethmoid sinus disease. 2. No acute intracranial process.     Electronically Signed   By: Ozell Daring M.D.   On: 10/20/2022 16:14  COGNITION: Overall cognitive status: Within functional limits for tasks assessed   SENSATION: Pt has neuropathy affecting bilat LE and RUE; she describes sensation as numbness  COORDINATION: WFL BLE and BUE with rapid alt movement, chin<>target; shin>contrlat LE   EDEMA:  She reports every now and then she gets some swelling in BLE has been ongoing 4 years /not new   POSTURE: rounded shoulders, increased thoracic kyphosis    LOWER EXTREMITY MMT:   Grossly 4/5 bilat LE most deficits found proximal musculature   BED MOBILITY:  Rolling makes pt lightheaded   TRANSFERS: Able to complete hands-free in session with SBA  RAMP:  impaired  confidence in balance on ramps per ABC questionnaire    STAIRS:  impaired confidence in balance on stairs per ABC questionnaire   GAIT: See below: decreased gait speed, uses SPC, close CGA without AD, shuffling-like gait/decreased step length and decreased heel strike, limited arm-swing and hip extension bilat, L lateral sway in mid-stance  FUNCTIONAL TESTS:  5 times sit to stand: 13 sec, hands-free  Timed up and go (TUG): 19,5 sec no AD; 14 sec with SPC  10 meter walk test: 0.61 m/s with [employer] Berg Balance Scale: deferred    PATIENT SURVEYS:  ABC scale 58.75%  Vestibular Assessment from 02/22/2024:  OCULOMOTOR EXAM:  Ocular Alignment: normal, some skin drooping around R eye impacting alignment slightly, but relatively symmetrical  Ocular ROM: No Limitations  Spontaneous Nystagmus: absent  Gaze-Induced Nystagmus: absent  Smooth Pursuits: intact  Saccades: slow  Convergence/Divergence: appears normal  Cover/Cross-Cover: WNL  VESTIBULAR - OCULAR REFLEX:   Slow VOR: Normal  VOR Cancellation: Normal, when having pt perform it herself she moved her head more quickly and she said she felt a little lightheadedness  Head-Impulse Test: unable to test due to pt guarding  Dynamic Visual Acuity: to be tested if needed  POSITIONAL TESTING: Right Dix-Hallpike: upbeating, right nystagmus and Duration: <30 seconds Right Sidelying: symptomatic upon return to sitting upright Left Sidelying: no nystagmus and no symptoms  MOTION SENSITIVITY: to be tested if needed  Motion Sensitivity Quotient                                                                                                                              TREATMENT DATE: 05/09/2024  Pt ambulates in therapy clinic using hurricane with short, shuffled gait, and decreased gait speed - noticed L LE step length is shorter than R LE. Close SBA provided for patient safety.  Therapy session focused on re-assessment of standardized  outcome measures and subjective questionnaire to determine pt's progress with therapy thus far with plan to submit for re-certification.   The Activities-Specific Balance Confidence (ABC) Scale 0% 10 20 30  40 50 60 70  80 90 100% No confidence<->completely confident  "How confident are you that you will not lose your balance or become unsteady when you . . .   Date tested 05/09/2024  Walk around the house 100%  2. Walk up or down stairs 100%  3. Bend over and pick up a slipper from in front of a closet floor 60%  4. Reach for a small can off a shelf at eye level 100%  5. Stand on tip toes and reach for something above your head 40%  6. Stand on a chair and reach for something 0%  7. Sweep the floor 100%  8. Walk outside the house to a car parked in the driveway 100%  9. Get into or out of a car 100%  10. Walk across a parking lot to the mall 90%  11. Walk up or down a ramp 100%  12. Walk in a crowded mall where people rapidly walk past you 40%  13. Are bumped into by people as you walk through the mall 70%  14. Step onto or off of an escalator while you are holding onto the railing 60%  15. Step onto or off an escalator while holding onto parcels such that you cannot hold onto the railing 0%  16. Walk outside on icy sidewalks 30%  Total: #/16 68.125%    10 Meter Walk Test: Patient instructed to walk 10 meters (32.8 ft) as quickly and as safely as possible at their normal speed x2 and at a fast speed x2. Time measured from 2 meter mark to 8 meter mark to accommodate ramp-up and ramp-down.  Normal speed 1: 0.72 m/s (13.75 seconds) Normal speed 2: 0.80 m/s (12.43 seconds) Average Normal speed: 0.76 m/s using hurricane with SBA Fast speed 1: 0.92 m/s (10.80 seconds) Fast speed 2: 0.87 m/s (11.39 seconds) Average Fast speed: 0.89 m/s using hurricane with SBA Cut off scores: <0.4 m/s = household Ambulator, 0.4-0.8 m/s = limited community Ambulator, >0.8 m/s = community Ambulator, >1.2 m/s  = crossing a street, <1.0 = increased fall risk MCID 0.05 m/s (small), 0.13 m/s (moderate), 0.06 m/s (significant)  (ANPTA Core Set of Outcome Measures for Adults with Neurologic Conditions, 2018)  *Pt demos improved B LE step lengths during this compared to during gait when entering therapy clinic   Participated in Timed Up and Go (TUG): 1st trial: 18.78 seconds 2nd trial: 13.94 seconds  Average: 16.36 seconds using hurricane with SBA Patient demonstrates high fall risk as indicated by requiring >13.5seconds to complete the TUG.   Pt states she doesn't have confidence when turning.     Five times Sit to Stand Test (FTSS) "Stand up and sit down as quickly as possible 5 times, keeping your arms folded across your chest."    TIME: 11.99 seconds pushing up with B UE support on armrests 12.08 seconds with arms across chest  Times > 13.6 seconds is associated with increased disability and morbidity (Guralnik, 2000) Times > 15 seconds is predictive of recurrent falls in healthy individuals aged 28 and older (Buatois, et al., 2008) Normal performance values in community dwelling individuals aged 54 and older (Bohannon, 2006): 60-69 years: 11.4 seconds 70-79 years: 12.6 seconds 80-89 years: 14.8 seconds  MCID: >= 2.3 seconds for Vestibular Disorders Ary, 2006)   Patient participated in [place] Balance Test and demonstrates increased fall risk as noted by score of 36/56.  (<36= high risk for falls, close to 100%; 37-45 significant >80%; 46-51 moderate >50%; 52-55 lower >25%).  [facility] PT Assessment - 05/09/24 0001       Berg Balance Test   Sit to Stand Able to stand without using hands and stabilize independently    Standing Unsupported Able to stand safely 2 minutes    Sitting with Back Unsupported but Feet Supported on Floor or Stool Able to sit safely and securely 2 minutes    Stand to Sit Sits safely with minimal use of hands    Transfers Able to transfer safely, definite need  of hands    Standing Unsupported with Eyes Closed Able to stand 10 seconds with supervision    Standing Unsupported with Feet Together Able to place feet together independently and stand for 1 minute with supervision    From Standing, Reach Forward with Outstretched Arm Can reach forward >5 cm safely (2)    From Standing Position, Pick up Object from Floor Able to pick up shoe, needs supervision    From Standing Position, Turn to Look Behind Over each Shoulder Needs supervision when turning   pt reports this making her dizzy today   Turn 360 Degrees Needs close supervision or verbal cueing    Standing Unsupported, Alternately Place Feet on Step/Stool Able to complete >2 steps/needs minimal assist    Standing Unsupported, One Foot in Front Able to take small step independently and hold 30 seconds    Standing on One Leg Tries to lift leg/unable to hold 3 seconds but remains standing independently    Total Score 36          Therapist educated pt on results of assessments today and pt eager to continue participating in therapy.   PATIENT EDUCATION: Education details: assessment findings, plan, goals Person educated: Patient and pt's friend present for eval  Education method: Explanation Education comprehension: verbalized understanding  HOME EXERCISE PROGRAM:  Access Code: [email or web address] URL: [email or web address] Date: 03/28/2024 Prepared by: Connell Kiss  Exercises - Sit to Stand with Counter Support  - 1 x daily - 7 x weekly - 2 sets - 10 reps - Side Stepping with Counter Support  - 1 x daily - 7 x weekly - 3 sets - 10 reps - Lunge with Counter Support  - 1 x daily - 3-5 x weekly - 2 sets - 10 reps - Narrow Stance with Unilateral Counter Support  - 1 x daily - 7 x weekly - 2 sets - 30 seconds hold - Standing Romberg to 1/2 Tandem Stance  - 1 x daily - 7 x weekly - 2 sets - 30 seconds hold   GOALS: Goals reviewed with patient? Yes  SHORT TERM GOALS: Target date:  06/20/2024  Patient will be independent in home exercise program to improve strength/mobility for better functional independence with ADLs. Baseline: initiated on 03/07/2024, updated on 03/28/2024 Goal status: INITIAL   LONG TERM GOALS: Target date: 08/01/2024   Patient will increase ABC scale score >80% to demonstrate better functional mobility and better confidence with ADLs.  Baseline: 58.75% 05/09/2024: 68.125% Goal status: IN PROGRESS  2.  Patient will complete five times sit to stand test in < 10 seconds indicating an increased LE strength and improved balance. Baseline: 13 sec 05/09/2024: 12.08 seconds with arms across chest Goal status: IN PROGRESS  3.  Patient will increase Berg Balance score by > 6 points to demonstrate decreased fall risk during functional activities Baseline: 02/29/2024: 36/56 05/09/2024: 36/56  Goal status: IN PROGRESS  4.  Patient will increase 10 meter walk test to >1.75m/s as to improve  gait speed for better community ambulation and to reduce fall risk. Baseline: 0.61 m/s with [facility] 05/09/2024: Average Normal speed: 0.76 m/s & Average Fast speed: 0.89 m/s using hurricane Goal status: IN PROGRESS  5.  Patient will reduce timed up and go to <11 seconds to reduce fall risk and demonstrate improved transfer/gait ability. Baseline: 19.5 sec no AD, 14 sec with SPC 05/09/2024: 16.36 seconds using hurricane with SBA Goal status: IN PROGRESS    ASSESSMENT:  CLINICAL IMPRESSION:  Patient is a pleasant 83 y.o. female who was seen today for physical therapy treatment for imbalance and neuropathy. Therapy session focused on re-assessment of standardized outcome measures and subjective questionnaire to determine pt's progress with therapy thus far and submitting for re-certification. Patient demonstrates significant improvement in her confidence with functional mobility and ADLs as noted on ABC scale. Patient also demonstrates improvement on 5xSTS, , and TUG  demonstrating improvement in her functional strength for transfers as well as her gait speed. Patient did not demonstrate improvement on Berg at this time as standing balance is when pt feels least confident when she is not using UE support. Patient will benefit from continued skilled physical therapy to address these impairments and continue progressing towards the low fall risk cut-off scores of these standardized outcome measures. Ms. Nordquist will benefit from further skilled PT to improve impairments in order to decrease fall risk, and increase QOL and ease and safety with ADLs.   OBJECTIVE IMPAIRMENTS: Abnormal gait, decreased activity tolerance, decreased balance, decreased mobility, difficulty walking, decreased ROM, decreased strength, dizziness, impaired sensation, impaired UE functional use, improper body mechanics, postural dysfunction, and pain.   ACTIVITY LIMITATIONS: carrying, lifting, bending, standing, squatting, stairs, transfers, bed mobility, bathing, and locomotion level  PARTICIPATION LIMITATIONS: meal prep, cleaning, shopping, community activity, occupation, and yard work  PERSONAL FACTORS: Age, Fitness, Sex, Time since onset of injury/illness/exacerbation, and 3+ comorbidities: Per chart PMH significant for breast CA (R), hx hip fx, hx alcoholism, depression, B12 deficiency, Vitamin D  deficiency, osteoporosis without current pathological fx, mass of upper quadrant L breast, closed comminuted intertrochanteric fx of proximal femur, atherosclerosis of aorta are also affecting patient's functional outcome.   REHAB POTENTIAL: Good  CLINICAL DECISION MAKING: Evolving/moderate complexity  EVALUATION COMPLEXITY: Moderate  PLAN:  PT FREQUENCY: 1-2x/week  PT DURATION: 12 weeks  PLANNED INTERVENTIONS: 97164- PT Re-evaluation, 97750- Physical Performance Testing, 97110-Therapeutic exercises, 97530- Therapeutic activity, [id number]- Neuromuscular re-education, 97535- Self Care, [id number]-  Manual therapy, [id number]- Gait training, [phone]- Orthotic Initial, [phone]- Orthotic/Prosthetic subsequent, [phone]- Canalith repositioning, [phone]- Splinting, [phone]- Electrical stimulation (manual), Patient/Family education, Balance training, Stair training, Taping, Joint mobilization, Spinal mobilization, Vestibular training, DME instructions, Wheelchair mobility training, Cryotherapy, and Moist heat    PLAN FOR NEXT SESSION:  - review HEP - standing balance:  - reaching activities   - turning 360 degrees!  - alternating foot taps   - progression to semi-tandem  - fwd/back & side stepping over obstacles - dynamic gait training  - need to include turning more    Carisha Kantor, PT, DPT, NCS, CSRS Physical Therapist - [facility]  [facility]  9:43 AM 05/09/24

## 2024-05-16 ENCOUNTER — Ambulatory Visit: Admitting: Physical Therapy

## 2024-05-16 ENCOUNTER — Ambulatory Visit: Admitting: Podiatry

## 2024-05-16 VITALS — Ht 62.0 in | Wt 150.0 lb

## 2024-05-16 DIAGNOSIS — G63 Polyneuropathy in diseases classified elsewhere: Secondary | ICD-10-CM | POA: Diagnosis not present

## 2024-05-16 DIAGNOSIS — M6281 Muscle weakness (generalized): Secondary | ICD-10-CM | POA: Diagnosis not present

## 2024-05-16 DIAGNOSIS — B351 Tinea unguium: Secondary | ICD-10-CM | POA: Diagnosis not present

## 2024-05-16 DIAGNOSIS — M79674 Pain in right toe(s): Secondary | ICD-10-CM | POA: Diagnosis not present

## 2024-05-16 DIAGNOSIS — M79675 Pain in left toe(s): Secondary | ICD-10-CM | POA: Diagnosis not present

## 2024-05-16 DIAGNOSIS — R2681 Unsteadiness on feet: Secondary | ICD-10-CM

## 2024-05-16 DIAGNOSIS — R262 Difficulty in walking, not elsewhere classified: Secondary | ICD-10-CM | POA: Diagnosis not present

## 2024-05-16 DIAGNOSIS — L84 Corns and callosities: Secondary | ICD-10-CM | POA: Diagnosis not present

## 2024-05-16 DIAGNOSIS — R42 Dizziness and giddiness: Secondary | ICD-10-CM | POA: Diagnosis not present

## 2024-05-16 DIAGNOSIS — E538 Deficiency of other specified B group vitamins: Secondary | ICD-10-CM

## 2024-05-16 NOTE — Progress Notes (Signed)
  Subjective:  Patient ID: Rebecca Faulkner, female    DOB: September 22, 1941,  MRN: [id number]  Chief Complaint  Patient presents with   Nail Problem    Rm 9 Patient is here for f/u bilateral onychomycosis and nail trimming today.    83 y.o. female presents with the above complaint. History confirmed with patient.  Her nails are thickened and elongated again.  She also has painful corns on the outside of the fifth toes that are painful  Objective:  Physical Exam: warm, good capillary refill, no trophic changes or ulcerative lesions, normal DP and PT pulses, and decreased peripheral sensory exam.  Bilateral fifth toe corn Left Foot: dystrophic yellowed discolored nail plates with subungual debris Right Foot: dystrophic yellowed discolored nail plates with subungual debris  Assessment:   1. Pain due to onychomycosis of toenails of both feet   2. Callus of foot   3. Vitamin B12 deficiency neuropathy (HCC)      Plan:  Patient was evaluated and treated and all questions answered.  Discussed the etiology and treatment options for the condition in detail with the patient. Recommended debridement of the nails today. Sharp and mechanical debridement performed of all painful and mycotic nails today. Nails debrided in length and thickness using a nail nipper to level of comfort. Discussed treatment options including appropriate shoe gear.   Continue regular intermittent debridement has been helpful in reducing pain and improving function.   All symptomatic hyperkeratoses were safely debrided with a sterile #15 blade to patient's level of comfort without incident.  This will reduce pain and improve function.  She does have a history of polyneuropathy from B12 deficiency that requires professional care.  ABN signed.   Return in about 4 months (around 09/16/2024) for painful thick fungal nails / corns.

## 2024-05-16 NOTE — Therapy (Signed)
 OUTPATIENT PHYSICAL THERAPY NEURO TREATMENT   Patient Name: Rebecca Faulkner MRN: [id number] DOB:05-27-1941, 83 y.o., female Today's Date: 05/16/2024   PCP: Bernardo Fend, DO REFERRING PROVIDER: Lane Arthea BRAVO, MD  END OF SESSION:   PT End of Session - 05/16/24 0845     Visit Number 9    Number of Visits 49    Date for PT Re-Evaluation 08/01/24    PT Start Time 0845    PT Stop Time 0930    PT Time Calculation (min) 45 min    Equipment Utilized During Treatment Gait belt    Activity Tolerance Patient tolerated treatment well    Behavior During Therapy [facility] for tasks assessed/performed             Past Medical History:  Diagnosis Date   Breast cancer (HCC) 2013   right breast   Heart murmur 1990   Hypercholesteremia    Lump or mass in breast    Malignant neoplasm of upper-outer quadrant of female breast (HCC) 2013   R-breast T1, NO, MO, ER/PR pos., Her 2 neg.   Osteoporosis    Ulcer 1974   Past Surgical History:  Procedure Laterality Date   BREAST SURGERY Right 2013   CATARACT EXTRACTION W/PHACO Right 04/23/2016   Procedure: CATARACT EXTRACTION PHACO AND INTRAOCULAR LENS PLACEMENT (IOC) RIGHT EYE;  Surgeon: Dene Etienne, MD;  Location: [facility];  Service: Ophthalmology;  Laterality: Right;   CATARACT EXTRACTION W/PHACO Left 06/04/2016   Procedure: CATARACT EXTRACTION PHACO AND INTRAOCULAR LENS PLACEMENT (IOC);  Surgeon: Dene Etienne, MD;  Location: [facility];  Service: Ophthalmology;  Laterality: Left;   CESAREAN SECTION  1969   COLONOSCOPY WITH PROPOFOL  N/A 07/22/2017   Procedure: COLONOSCOPY WITH PROPOFOL ;  Surgeon: Dessa Reyes ORN, MD;  Location: [facility];  Service: Endoscopy;  Laterality: N/A;   FRACTURE SURGERY Left 2011   arm, plate and 12 screws   LEG SURGERY Right 01/02/2015   MASTECTOMY Right 2013   Right inguinal hernia repair  2013   VEIN SURGERY Left 2015   Patient Active Problem List   Diagnosis Date  Noted   History of diverticulitis 03/12/2020   Left ovarian cyst 03/12/2020   Atherosclerosis of aorta (HCC) 03/12/2020   Osteoporosis 08/05/2019   Closed comminuted intertrochanteric fracture of proximal femur (HCC) 07/27/2018   Mass of upper outer quadrant of left breast 06/01/2018   Goals of care, counseling/discussion 01/31/2018   Osteoporosis without current pathological fracture 07/28/2017   Depression, major, recurrent, mild (HCC) 07/07/2017   B12 deficiency 07/07/2017   Vitamin D  deficiency 07/07/2017   Screening for colon cancer 05/27/2017   Malignant neoplasm of upper-outer quadrant of right breast in female, estrogen receptor positive (HCC) 05/27/2017   Glaucoma 05/26/2017   History of alcoholism (HCC) 05/26/2017   History of hip fracture 05/10/2015   Breast cancer, right (HCC) 05/27/2012   Estrogen receptor positive status (ER+) 05/14/2012    ONSET DATE: 5 years ago  REFERRING DIAG:  R26.89 (ICD-10-CM) - Imbalance  G62.9 (ICD-10-CM) - Neuropathy    THERAPY DIAG:   Dizziness and giddiness  Unsteadiness on feet  Muscle weakness (generalized)  Difficulty in walking, not elsewhere classified  Rationale for Evaluation and Treatment: Rehabilitation  SUBJECTIVE:  SUBJECTIVE STATEMENT:  Pt reports she is doing OK. Pt states she feels like she was doing well being consistent with therapy, until she went on vacation, and feels she has declined some following the break from therapy. No reports of falls and no complaints of pain during session.     Initial Eval: Pt is a pleasant 83 y/o female presenting to PT for imbalance. Pt ambulating with a SPC. She states she initially thought she had vertigo when she started losing her balance where onset of imbalance was about 5 years ago. Pt  noticed imbalance with carrying tea while ambulating; she felt she was going to fall/had someone help her. Pt reports she still gets dizzy off and on. She describes it as weak and sometimes the room moves. When she had an MRI recently she saw the room spin. Pt reports no falls in the last six months, but she has had near-falls where she grabs onto stuff.  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 She has neuropathy affecting bilat LE.  Pt accompanied by: friend Colleen)  PERTINENT HISTORY: Per chart PMH significant for breast CA (R), hx hip fx, hx alcoholism, depression, B12 deficiency, Vitamin D  deficiency, osteoporosis without current pathological fx, mass of upper quadrant L breast, closed comminuted intertrochanteric fx of proximal femur, atherosclerosis of aorta, new diagnosis of normal pressure hydrocephalus  Neurology MD apt from 03/14/2024: Reviewed results of recent Brain MRI from 01/18/2024 in the office today. Results indicated mild ventriculomegaly with narrowing of the callosal angle. Symptoms indeterminate between normal pressure hydrocephalus and age-related changes at this time. Will continue to monitor at future visits.   Pt states she doesn't want to have a shunt put into her brain.  PAIN:  Are you having pain? General arthritis pain; she reports she takes Advil to help her pain  PRECAUTIONS: Fall, normal pressure hydrocephalus  RED FLAGS: Pt states she wears a diaper due to incontinence and says physician aware   WEIGHT BEARING RESTRICTIONS: No  FALLS: Has patient fallen in last 6 months? No  LIVING ENVIRONMENT: Lives with: lives alone Lives in: House/apartment Stairs: 7 steps to get into home, bilat rails Has following equipment at home: Single point cane, Grab bars, and grab bar in tub but does not use anymore  PLOF: indep with ADLs at home but is getting some help at work  PATIENT GOALS:  she wants to walk without a cane; I don't want to end up in a wheelchair   OBJECTIVE:  Note: Objective measures were completed at Evaluation unless otherwise noted.  DIAGNOSTIC FINDINGS:  EXAM 01/18/2024: MRI HEAD WITHOUT CONTRAST IMPRESSION: 1. Mild ventriculomegaly with narrowing of the callosal angle. Findings can be seen with normal pressure hydrocephalus in the appropriate clinical setting. 2. Chronic small vessel changes. 3. No acute infarct.     Electronically Signed   By: Clem Savory M.D.   On: 02/10/2024 11:19  CT HEAD 10/20/22: FINDINGS: Brain: No acute infarct or hemorrhage. Lateral ventricles and midline structures are stable. No acute extra-axial fluid collections. No mass effect.   Vascular: No hyperdense vessel or unexpected calcification.   Skull: Normal. Negative for fracture or focal lesion.   Sinuses/Orbits: Mild mucoperiosteal thickening throughout the ethmoid air cells. Remaining paranasal sinuses are clear.   Other: None.   IMPRESSION: 1. Ethmoid sinus disease. 2. No acute intracranial process.     Electronically Signed  By: Ozell Daring M.D.   On: 10/20/2022 16:14  COGNITION: Overall cognitive status: Within functional limits for tasks assessed   SENSATION: Pt has neuropathy affecting bilat LE and RUE; she describes sensation as numbness  COORDINATION: WFL BLE and BUE with rapid alt movement, chin<>target; shin>contrlat LE   EDEMA:  She reports every now and then she gets some swelling in BLE has been ongoing 4 years /not new   POSTURE: rounded shoulders, increased thoracic kyphosis    LOWER EXTREMITY MMT:   Grossly 4/5 bilat LE most deficits found proximal musculature   BED MOBILITY:  Rolling makes pt lightheaded   TRANSFERS: Able to complete hands-free in session with SBA  RAMP:  impaired confidence in balance on ramps per ABC questionnaire    STAIRS:  impaired confidence in balance on stairs per ABC questionnaire    GAIT: See below: decreased gait speed, uses SPC, close CGA without AD, shuffling-like gait/decreased step length and decreased heel strike, limited arm-swing and hip extension bilat, L lateral sway in mid-stance  FUNCTIONAL TESTS:  5 times sit to stand: 13 sec, hands-free  Timed up and go (TUG): 19,5 sec no AD; 14 sec with SPC  10 meter walk test: 0.61 m/s with [facility] Berg Balance Scale: deferred    PATIENT SURVEYS:  ABC scale 58.75%  Vestibular Assessment from 02/22/2024:  OCULOMOTOR EXAM:  Ocular Alignment: normal, some skin drooping around R eye impacting alignment slightly, but relatively symmetrical  Ocular ROM: No Limitations  Spontaneous Nystagmus: absent  Gaze-Induced Nystagmus: absent  Smooth Pursuits: intact  Saccades: slow  Convergence/Divergence: appears normal  Cover/Cross-Cover: WNL  VESTIBULAR - OCULAR REFLEX:   Slow VOR: Normal  VOR Cancellation: Normal, when having pt perform it herself she moved her head more quickly and she said she felt a little lightheadedness  Head-Impulse Test: unable to test due to pt guarding  Dynamic Visual Acuity: to be tested if needed  POSITIONAL TESTING: Right Dix-Hallpike: upbeating, right nystagmus and Duration: <30 seconds Right Sidelying: symptomatic upon return to sitting upright Left Sidelying: no nystagmus and no symptoms  MOTION SENSITIVITY: to be tested if needed  Motion Sensitivity Quotient                                                                                                                              TREATMENT DATE: 05/16/2024  Pt ambulates in therapy clinic using hurricane with short, shuffled gait, and decreased gait speed - noticed L LE step length is shorter than R LE. Close SBA provided for patient safety.  Gait training including:   ~183ft, no UE support, with CGA/ light min A for steadying  Cuing for longer step lengths and increased gait speed  Pt continues to have shorter step  lengths Continues to have more shuffled step with L LE compared to R LE Pt also improving arm swing, but still in partial guarded posture with increased elbow flexion  B LE functional strengthening  and dynamic balance interventions as follows: Alternating foot taps to 1st 6 step at stairs, goal of not using UE support 2x10 reps, light min A for steadying Cuing to slow down and make it more controlled to promote longer SLS time Added 2lb AW on each LE x10 reps per LE  Forward step-ups onto 1st 6 step at stairs using 1 UE support on HR, tapping alternate LE to 2nd step to promote reciprocal stepping pattern and increased hip extension activation 2lb AW on each LE  2x 10 reps each LE CGA/light min A for steadying/balance Forward/backwards stepping over obstacle (started with PVC pipe increased to pool noodle for increased height) x10reps each LE for each obstacle Pt able to be successful without UE support today and only a few instances of light min A for steadying!   Dynamic gait training using Blaze Pods with focus on turning - 5 Pods set on random at 0.5seconds delay - 63min30sec achieving 28 hits - hit 3 targets with hand and 2 with her feet - pt with greatest imbalance tapping with her feet requiring min A during this.   Educated pt on importance of prioritizing her balance during dual-task activities.   Gait training ~3ft, no AD, with close SBA for safety as pt reporting increasing confidence in her gait and wanting to attempt without hands-on assistance from therapist - demos improving step lengths although still decreased with short stance phase times and short step lengths.     PATIENT EDUCATION: Education details: assessment findings, plan, goals Person educated: Patient and pt's friend present for eval  Education method: Explanation Education comprehension: verbalized understanding  HOME EXERCISE PROGRAM:  Access Code: [email or web address] URL:  [email or web address] Date: 03/28/2024 Prepared by: Connell Kiss  Exercises - Sit to Stand with Counter Support  - 1 x daily - 7 x weekly - 2 sets - 10 reps - Side Stepping with Counter Support  - 1 x daily - 7 x weekly - 3 sets - 10 reps - Lunge with Counter Support  - 1 x daily - 3-5 x weekly - 2 sets - 10 reps - Narrow Stance with Unilateral Counter Support  - 1 x daily - 7 x weekly - 2 sets - 30 seconds hold - Standing Romberg to 1/2 Tandem Stance  - 1 x daily - 7 x weekly - 2 sets - 30 seconds hold   GOALS: Goals reviewed with patient? Yes  SHORT TERM GOALS: Target date: 06/20/2024  Patient will be independent in home exercise program to improve strength/mobility for better functional independence with ADLs. Baseline: initiated on 03/07/2024, updated on 03/28/2024 Goal status: INITIAL   LONG TERM GOALS: Target date: 08/01/2024   Patient will increase ABC scale score >80% to demonstrate better functional mobility and better confidence with ADLs.  Baseline: 58.75% 05/09/2024: 68.125% Goal status: IN PROGRESS  2.  Patient will complete five times sit to stand test in < 10 seconds indicating an increased LE strength and improved balance. Baseline: 13 sec 05/09/2024: 12.08 seconds with arms across chest Goal status: IN PROGRESS  3.  Patient will increase Berg Balance score by > 6 points to demonstrate decreased fall risk during functional activities Baseline: 02/29/2024: 36/56 05/09/2024: 36/56  Goal status: IN PROGRESS  4.  Patient will increase 10 meter walk test to >1.81m/s as to improve gait speed for better community ambulation and to reduce fall risk. Baseline: 0.61 m/s with [facility] 05/09/2024: Average Normal speed: 0.76 m/s & Average Fast speed: 0.89 m/s using hurricane  Goal status: IN PROGRESS  5.  Patient will reduce timed up and go to <11 seconds to reduce fall risk and demonstrate improved transfer/gait ability. Baseline: 19.5 sec no AD, 14 sec with  SPC 05/09/2024: 16.36 seconds using hurricane with SBA Goal status: IN PROGRESS    ASSESSMENT:  CLINICAL IMPRESSION:  Patient is a pleasant 83 y.o. female who was seen today for physical therapy treatment for imbalance and neuropathy. Therapy session focused on gait training to promote longer step lengths and increased stance phase time as pt with very quick, short steps forward (L more impaired than R). Patient demos significant improvement on forward/backwards stepping over small obstacles today without UE support! Patient also very engaged in the functional strengthening interventions to promote improved stance phase and and promote longer step length on contralateral LE. Patient also participated in dynamic turning task, but noted some decreased safety awareness with this as pt often trying to tap target with her foot prior to ensuring she has her balance, will benefit from continued interventions targeting improved turning. Ms. Tull will benefit from further skilled PT to improve impairments in order to decrease fall risk, and increase QOL and ease and safety with ADLs.   OBJECTIVE IMPAIRMENTS: Abnormal gait, decreased activity tolerance, decreased balance, decreased mobility, difficulty walking, decreased ROM, decreased strength, dizziness, impaired sensation, impaired UE functional use, improper body mechanics, postural dysfunction, and pain.   ACTIVITY LIMITATIONS: carrying, lifting, bending, standing, squatting, stairs, transfers, bed mobility, bathing, and locomotion level  PARTICIPATION LIMITATIONS: meal prep, cleaning, shopping, community activity, occupation, and yard work  PERSONAL FACTORS: Age, Fitness, Sex, Time since onset of injury/illness/exacerbation, and 3+ comorbidities: Per chart PMH significant for breast CA (R), hx hip fx, hx alcoholism, depression, B12 deficiency, Vitamin D  deficiency, osteoporosis without current pathological fx, mass of upper quadrant L breast, closed  comminuted intertrochanteric fx of proximal femur, atherosclerosis of aorta are also affecting patient's functional outcome.   REHAB POTENTIAL: Good  CLINICAL DECISION MAKING: Evolving/moderate complexity  EVALUATION COMPLEXITY: Moderate  PLAN:  PT FREQUENCY: 1-2x/week  PT DURATION: 12 weeks  PLANNED INTERVENTIONS: [id number]- PT Re-evaluation, 97750- Physical Performance Testing, 97110-Therapeutic exercises, 97530- Therapeutic activity, [id number]- Neuromuscular re-education, 97535- Self Care, [id number]- Manual therapy, [id number]- Gait training, [phone]- Orthotic Initial, [phone]- Orthotic/Prosthetic subsequent, [phone]- Canalith repositioning, [phone]- Splinting, [phone]- Electrical stimulation (manual), Patient/Family education, Balance training, Stair training, Taping, Joint mobilization, Spinal mobilization, Vestibular training, DME instructions, Wheelchair mobility training, Cryotherapy, and Moist heat    PLAN FOR NEXT SESSION:  *Progress Note - goals recently assessed* - review HEP - standing balance:  - reaching activities   - turning 360 degrees!  - alternating foot taps   - progression to semi-tandem  - fwd/back & side stepping over obstacles - dynamic gait training  - need to include turning more    Tavaughn Silguero, PT, DPT, NCS, CSRS Physical Therapist - [facility]  [facility]  11:30 AM 05/16/24

## 2024-05-23 ENCOUNTER — Ambulatory Visit: Admitting: Physical Therapy

## 2024-05-23 DIAGNOSIS — R2681 Unsteadiness on feet: Secondary | ICD-10-CM

## 2024-05-23 DIAGNOSIS — M6281 Muscle weakness (generalized): Secondary | ICD-10-CM | POA: Diagnosis not present

## 2024-05-23 DIAGNOSIS — R262 Difficulty in walking, not elsewhere classified: Secondary | ICD-10-CM | POA: Diagnosis not present

## 2024-05-23 DIAGNOSIS — R42 Dizziness and giddiness: Secondary | ICD-10-CM | POA: Diagnosis not present

## 2024-05-23 NOTE — Therapy (Addendum)
 OUTPATIENT PHYSICAL THERAPY NEURO TREATMENT  Physical Therapy Progress Note   Dates of reporting period  02/08/2024   to   05/23/2024    Patient Name: Rebecca Faulkner MRN: [id number] DOB:Sep 20, 1941, 83 y.o., female Today's Date: 05/23/2024   PCP: Bernardo Fend, DO REFERRING PROVIDER: Lane Arthea BRAVO, MD  END OF SESSION:   PT End of Session - 05/23/24 0846     Visit Number 10    Number of Visits 49    Date for PT Re-Evaluation 08/01/24    PT Start Time 0846    PT Stop Time 0928    PT Time Calculation (min) 42 min    Equipment Utilized During Treatment Gait belt    Activity Tolerance Patient tolerated treatment well    Behavior During Therapy [employer] for tasks assessed/performed              Past Medical History:  Diagnosis Date   Breast cancer (HCC) 2013   right breast   Heart murmur 1990   Hypercholesteremia    Lump or mass in breast    Malignant neoplasm of upper-outer quadrant of female breast (HCC) 2013   R-breast T1, NO, MO, ER/PR pos., Her 2 neg.   Osteoporosis    Ulcer 1974   Past Surgical History:  Procedure Laterality Date   BREAST SURGERY Right 2013   CATARACT EXTRACTION W/PHACO Right 04/23/2016   Procedure: CATARACT EXTRACTION PHACO AND INTRAOCULAR LENS PLACEMENT (IOC) RIGHT EYE;  Surgeon: Dene Etienne, MD;  Location: [facility];  Service: Ophthalmology;  Laterality: Right;   CATARACT EXTRACTION W/PHACO Left 06/04/2016   Procedure: CATARACT EXTRACTION PHACO AND INTRAOCULAR LENS PLACEMENT (IOC);  Surgeon: Dene Etienne, MD;  Location: [facility];  Service: Ophthalmology;  Laterality: Left;   CESAREAN SECTION  1969   COLONOSCOPY WITH PROPOFOL  N/A 07/22/2017   Procedure: COLONOSCOPY WITH PROPOFOL ;  Surgeon: Dessa Reyes ORN, MD;  Location: [facility];  Service: Endoscopy;  Laterality: N/A;   FRACTURE SURGERY Left 2011   arm, plate and 12 screws   LEG SURGERY Right 01/02/2015   MASTECTOMY Right 2013   Right  inguinal hernia repair  2013   VEIN SURGERY Left 2015   Patient Active Problem List   Diagnosis Date Noted   History of diverticulitis 03/12/2020   Left ovarian cyst 03/12/2020   Atherosclerosis of aorta (HCC) 03/12/2020   Osteoporosis 08/05/2019   Closed comminuted intertrochanteric fracture of proximal femur (HCC) 07/27/2018   Mass of upper outer quadrant of left breast 06/01/2018   Goals of care, counseling/discussion 01/31/2018   Osteoporosis without current pathological fracture 07/28/2017   Depression, major, recurrent, mild (HCC) 07/07/2017   B12 deficiency 07/07/2017   Vitamin D  deficiency 07/07/2017   Screening for colon cancer 05/27/2017   Malignant neoplasm of upper-outer quadrant of right breast in female, estrogen receptor positive (HCC) 05/27/2017   Glaucoma 05/26/2017   History of alcoholism (HCC) 05/26/2017   History of hip fracture 05/10/2015   Breast cancer, right (HCC) 05/27/2012   Estrogen receptor positive status (ER+) 05/14/2012    ONSET DATE: 5 years ago  REFERRING DIAG:  R26.89 (ICD-10-CM) - Imbalance  G62.9 (ICD-10-CM) - Neuropathy    THERAPY DIAG:   Dizziness and giddiness  Unsteadiness on feet  Muscle weakness (generalized)  Difficulty in walking, not elsewhere classified  Rationale for Evaluation and Treatment: Rehabilitation  SUBJECTIVE:  SUBJECTIVE STATEMENT:  Pt reports the heat is getting to her causing increased fatigue. Reports normal amounts of pain. Denies falls and stumbles.     Initial Eval: Pt is a pleasant 83 y/o female presenting to PT for imbalance. Pt ambulating with a SPC. She states she initially thought she had vertigo when she started losing her balance where onset of imbalance was about 5 years ago. Pt noticed imbalance with carrying  tea while ambulating; she felt she was going to fall/had someone help her. Pt reports she still gets dizzy off and on. She describes it as weak and sometimes the room moves. When she had an MRI recently she saw the room spin. Pt reports no falls in the last six months, but she has had near-falls where she grabs onto stuff.  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 She has neuropathy affecting bilat LE.  Pt accompanied by: friend Counsellor)  PERTINENT HISTORY: Per chart PMH significant for breast CA (R), hx hip fx, hx alcoholism, depression, B12 deficiency, Vitamin D  deficiency, osteoporosis without current pathological fx, mass of upper quadrant L breast, closed comminuted intertrochanteric fx of proximal femur, atherosclerosis of aorta, new diagnosis of normal pressure hydrocephalus  Neurology MD apt from 03/14/2024: Reviewed results of recent Brain MRI from 01/18/2024 in the office today. Results indicated mild ventriculomegaly with narrowing of the callosal angle. Symptoms indeterminate between normal pressure hydrocephalus and age-related changes at this time. Will continue to monitor at future visits.   Pt states she doesn't want to have a shunt put into her brain.  PAIN:  Are you having pain? General arthritis pain; she reports she takes Advil to help her pain  PRECAUTIONS: Fall, normal pressure hydrocephalus  RED FLAGS: Pt states she wears a diaper due to incontinence and says physician aware   WEIGHT BEARING RESTRICTIONS: No  FALLS: Has patient fallen in last 6 months? No  LIVING ENVIRONMENT: Lives with: lives alone Lives in: House/apartment Stairs: 7 steps to get into home, bilat rails Has following equipment at home: Single point cane, Grab bars, and grab bar in tub but does not use anymore  PLOF: indep with ADLs at home but is getting some help at work  PATIENT GOALS: she wants to walk without a cane; I  don't want to end up in a wheelchair   OBJECTIVE:  Note: Objective measures were completed at Evaluation unless otherwise noted.  DIAGNOSTIC FINDINGS:  EXAM 01/18/2024: MRI HEAD WITHOUT CONTRAST IMPRESSION: 1. Mild ventriculomegaly with narrowing of the callosal angle. Findings can be seen with normal pressure hydrocephalus in the appropriate clinical setting. 2. Chronic small vessel changes. 3. No acute infarct.     Electronically Signed   By: Clem Savory M.D.   On: 02/10/2024 11:19  CT HEAD 10/20/22: FINDINGS: Brain: No acute infarct or hemorrhage. Lateral ventricles and midline structures are stable. No acute extra-axial fluid collections. No mass effect.   Vascular: No hyperdense vessel or unexpected calcification.   Skull: Normal. Negative for fracture or focal lesion.   Sinuses/Orbits: Mild mucoperiosteal thickening throughout the ethmoid air cells. Remaining paranasal sinuses are clear.   Other: None.   IMPRESSION: 1. Ethmoid sinus disease. 2. No acute intracranial process.     Electronically Signed   By: Ozell Daring M.D.   On: 10/20/2022 16:14  COGNITION: Overall cognitive status: Within functional limits for tasks assessed   SENSATION: Pt  has neuropathy affecting bilat LE and RUE; she describes sensation as numbness  COORDINATION: WFL BLE and BUE with rapid alt movement, chin<>target; shin>contrlat LE   EDEMA:  She reports every now and then she gets some swelling in BLE has been ongoing 4 years /not new   POSTURE: rounded shoulders, increased thoracic kyphosis    LOWER EXTREMITY MMT:   Grossly 4/5 bilat LE most deficits found proximal musculature   BED MOBILITY:  Rolling makes pt lightheaded   TRANSFERS: Able to complete hands-free in session with SBA  RAMP:  impaired confidence in balance on ramps per ABC questionnaire    STAIRS:  impaired confidence in balance on stairs per ABC questionnaire   GAIT: See below:  decreased gait speed, uses SPC, close CGA without AD, shuffling-like gait/decreased step length and decreased heel strike, limited arm-swing and hip extension bilat, L lateral sway in mid-stance  FUNCTIONAL TESTS:  5 times sit to stand: 13 sec, hands-free  Timed up and go (TUG): 19,5 sec no AD; 14 sec with SPC  10 meter walk test: 0.61 m/s with [facility] Berg Balance Scale: deferred    PATIENT SURVEYS:  ABC scale 58.75%  Vestibular Assessment from 02/22/2024:  OCULOMOTOR EXAM:  Ocular Alignment: normal, some skin drooping around R eye impacting alignment slightly, but relatively symmetrical  Ocular ROM: No Limitations  Spontaneous Nystagmus: absent  Gaze-Induced Nystagmus: absent  Smooth Pursuits: intact  Saccades: slow  Convergence/Divergence: appears normal  Cover/Cross-Cover: WNL  VESTIBULAR - OCULAR REFLEX:   Slow VOR: Normal  VOR Cancellation: Normal, when having pt perform it herself she moved her head more quickly and she said she felt a little lightheadedness  Head-Impulse Test: unable to test due to pt guarding  Dynamic Visual Acuity: to be tested if needed  POSITIONAL TESTING: Right Dix-Hallpike: upbeating, right nystagmus and Duration: <30 seconds Right Sidelying: symptomatic upon return to sitting upright Left Sidelying: no nystagmus and no symptoms  MOTION SENSITIVITY: to be tested if needed  Motion Sensitivity Quotient                                                                                                                              TREATMENT DATE: 05/23/2024   Pt ambulates in therapy clinic using hurricane with short, shuffled gait, and decreased gait speed - noticed L LE step length is shorter than R LE. Close SBA provided for patient safety.  10 Meter Walk Test: Patient instructed to walk 10 meters (32.8 ft) as quickly and as safely as possible at their normal speed x2 and at a fast speed x2. Time measured from 2 meter mark to 8 meter mark to  accommodate ramp-up and ramp-down.  Normal speed 1: 0.868 m/s (11.52 seconds) Normal speed 2: 0.965 m/s (10.36 seconds) Average Normal speed: 0.9165 m/s using hurricane with SBA - pt taking noticeably larger step lengths with consistent reciprocal pattern Fast speed 1: 0.815 m/s (12.26 seconds) Fast speed 2: 0.985  m/s (10.15 seconds) Average Fast speed: 0.90 m/s using hurricane with SBA - when pt attempting to walk quickly she walks with quicker, shorter step lengths  Cut off scores: <0.4 m/s = household Ambulator, 0.4-0.8 m/s = limited community Ambulator, >0.8 m/s = community Ambulator, >1.2 m/s = crossing a street, <1.0 = increased fall risk MCID 0.05 m/s (small), 0.13 m/s (moderate), 0.06 m/s (significant)  (ANPTA Core Set of Outcome Measures for Adults with Neurologic Conditions, 2018)   Gait training including:   ~137ft, no UE support, with CGA for steadying  Cuing for longer step lengths and increased gait speed  Pt continues to have shorter step lengths Continues to have more shuffled step with L LE compared to R LE Pt also improving arm swing, but still in partial guarded posture with increased elbow flexion  B LE functional strengthening and dynamic balance interventions as follows: Alternating foot taps to 1st 6 step at stairs, goal of not using UE support Added 2.5lb AW on each LE 2x10 reps, light min A for steadying due to pt having slight R anterior lean Cuing to slow down and make it more controlled to promote longer SLS time Improved ability to perform without UE support until final 2 reps on 2nd set when pt becomes fatigued Forward step-ups onto 1st 6 step at stairs using 1 UE support on HR, tapping alternate LE to 2nd step to promote reciprocal stepping pattern and increased hip extension activation 2.5lb AW on each LE  2x 10 reps each LE CGA for safety   Dynamic gait training using agility ladder including the following:  Forward reciprocal stepping Requires R  HHA for safety and balance providing min A  3 laps including additional ~53ft of gait training at end for carrying over improved gait mechanics to promote Side stepping  Requires R HHA for min A 2 laps  This is significantly challenging with pt having difficulty taking large enough step to allow room for following LE Forward reciprocal pattern down x1 then carrying over those improved gait mechanics of longer step lengths to walk out of therapy clinic ~67ft with pt doing well with cue to imagine she is still stepping through the ladder still     PATIENT EDUCATION: Education details: assessment findings, plan, goals Person educated: Patient and pt's friend present for eval  Education method: Explanation Education comprehension: verbalized understanding  HOME EXERCISE PROGRAM:  Access Code: [email or web address] URL: [email or web address] Date: 03/28/2024 Prepared by: Connell Kiss  Exercises - Sit to Stand with Counter Support  - 1 x daily - 7 x weekly - 2 sets - 10 reps - Side Stepping with Counter Support  - 1 x daily - 7 x weekly - 3 sets - 10 reps - Lunge with Counter Support  - 1 x daily - 3-5 x weekly - 2 sets - 10 reps - Narrow Stance with Unilateral Counter Support  - 1 x daily - 7 x weekly - 2 sets - 30 seconds hold - Standing Romberg to 1/2 Tandem Stance  - 1 x daily - 7 x weekly - 2 sets - 30 seconds hold   GOALS: Goals reviewed with patient? Yes  SHORT TERM GOALS: Target date: 06/20/2024  Patient will be independent in home exercise program to improve strength/mobility for better functional independence with ADLs. Baseline: initiated on 03/07/2024, updated on 03/28/2024 Goal status: INITIAL   LONG TERM GOALS: Target date: 08/01/2024   Patient will increase ABC scale score >80% to demonstrate better functional mobility and  better confidence with ADLs.  Baseline: 58.75% 05/09/2024: 68.125% Goal status: IN PROGRESS  2.  Patient will complete five times sit to stand  test in < 10 seconds indicating an increased LE strength and improved balance. Baseline: 13 sec 05/09/2024: 12.08 seconds with arms across chest Goal status: IN PROGRESS  3.  Patient will increase Berg Balance score by > 6 points to demonstrate decreased fall risk during functional activities Baseline: 02/29/2024: 36/56 05/09/2024: 36/56  Goal status: IN PROGRESS  4.  Patient will increase 10 meter walk test to >1.21m/s as to improve gait speed for better community ambulation and to reduce fall risk. Baseline: 0.61 m/s with [facility] 05/09/2024: Average Normal speed: 0.76 m/s & Average Fast speed: 0.89 m/s using hurricane 05/23/2024: Average Normal speed: 0.9165 m/s & Average Fast speed: 0.90 m/s using hurricane with SBA   Goal status: IN PROGRESS  5.  Patient will reduce timed up and go to <11 seconds to reduce fall risk and demonstrate improved transfer/gait ability. Baseline: 19.5 sec no AD, 14 sec with SPC 05/09/2024: 16.36 seconds using hurricane with SBA 05/23/2024:  Goal status: IN PROGRESS    ASSESSMENT:  CLINICAL IMPRESSION:   Patient is a pleasant 83 y.o. female who was seen today for physical therapy treatment for imbalance and neuropathy. Therapy session focused on gait training to promote longer step lengths and increased stance phase time as pt with very quick, short steps forward (L more impaired than R). Patient continues to respond really well to step-up B LE functional strengthening interventions at stairs to promote improved hip/knee extensor activation to increase stance phase control to promote improved contralateral LE step length. Participated in dynamic gait training using agility ladder today with pt demonstrating significant improvement in reciprocal stepping pattern with longer stance phase times and longer step lengths; however, does require R HHA throughout for balance and confidence. Patient will benefit from continuation of gait training with use of agility ladder.  Patient's condition has the potential to improve in response to therapy. Maximum improvement is yet to be obtained. The anticipated improvement is attainable and reasonable in a generally predictable time. Ms. Rossetti will benefit from further skilled PT to improve impairments in order to decrease fall risk, and increase QOL and ease and safety with ADLs.    OBJECTIVE IMPAIRMENTS: Abnormal gait, decreased activity tolerance, decreased balance, decreased mobility, difficulty walking, decreased ROM, decreased strength, dizziness, impaired sensation, impaired UE functional use, improper body mechanics, postural dysfunction, and pain.   ACTIVITY LIMITATIONS: carrying, lifting, bending, standing, squatting, stairs, transfers, bed mobility, bathing, and locomotion level  PARTICIPATION LIMITATIONS: meal prep, cleaning, shopping, community activity, occupation, and yard work  PERSONAL FACTORS: Age, Fitness, Sex, Time since onset of injury/illness/exacerbation, and 3+ comorbidities: Per chart PMH significant for breast CA (R), hx hip fx, hx alcoholism, depression, B12 deficiency, Vitamin D  deficiency, osteoporosis without current pathological fx, mass of upper quadrant L breast, closed comminuted intertrochanteric fx of proximal femur, atherosclerosis of aorta are also affecting patient's functional outcome.   REHAB POTENTIAL: Good  CLINICAL DECISION MAKING: Evolving/moderate complexity  EVALUATION COMPLEXITY: Moderate  PLAN:  PT FREQUENCY: 1-2x/week  PT DURATION: 12 weeks  PLANNED INTERVENTIONS: [id number]- PT Re-evaluation, 97750- Physical Performance Testing, 97110-Therapeutic exercises, 97530- Therapeutic activity, [id number]- Neuromuscular re-education, 97535- Self Care, [id number]- Manual therapy, [id number]- Gait training, [phone]- Orthotic Initial, [phone]- Orthotic/Prosthetic subsequent, [phone]- Canalith repositioning, [phone]- Splinting, [phone]- Electrical stimulation (manual), Patient/Family education, Balance  training, Stair training, Taping, Joint mobilization, Spinal mobilization, Vestibular training, DME instructions, Wheelchair  mobility training, Cryotherapy, and Moist heat    PLAN FOR NEXT SESSION:  - review HEP - standing balance:  - reaching activities   - turning 360 degrees!  - alternating foot taps   - progression to semi-tandem  - fwd/back & side stepping over obstacles - dynamic gait training  - need to include turning more  - continue use of agility ladder    Connell Kiss, PT, DPT, NCS, CSRS Physical Therapist - [facility]  [facility]  9:29 AM 05/23/24

## 2024-05-30 ENCOUNTER — Ambulatory Visit: Admitting: Physical Therapy

## 2024-05-30 ENCOUNTER — Ambulatory Visit: Attending: Neurology | Admitting: Physical Therapy

## 2024-05-30 DIAGNOSIS — R42 Dizziness and giddiness: Secondary | ICD-10-CM | POA: Diagnosis not present

## 2024-05-30 DIAGNOSIS — R2681 Unsteadiness on feet: Secondary | ICD-10-CM | POA: Diagnosis not present

## 2024-05-30 DIAGNOSIS — M6281 Muscle weakness (generalized): Secondary | ICD-10-CM | POA: Diagnosis not present

## 2024-05-30 DIAGNOSIS — R262 Difficulty in walking, not elsewhere classified: Secondary | ICD-10-CM | POA: Diagnosis not present

## 2024-05-30 NOTE — Therapy (Signed)
 OUTPATIENT PHYSICAL THERAPY NEURO TREATMENT   Patient Name: Rebecca Faulkner MRN: [id number] DOB:01/21/1941, 83 y.o., female Today's Date: 05/30/2024   PCP: Bernardo Fend, DO REFERRING PROVIDER: Lane Arthea BRAVO, MD  END OF SESSION:   PT End of Session - 05/30/24 0804     Visit Number 11    Number of Visits 49    Date for PT Re-Evaluation 08/01/24    PT Start Time 0804    PT Stop Time 0844    PT Time Calculation (min) 40 min    Equipment Utilized During Treatment Gait belt    Activity Tolerance Patient tolerated treatment well    Behavior During Therapy [facility] for tasks assessed/performed               Past Medical History:  Diagnosis Date   Breast cancer (HCC) 2013   right breast   Heart murmur 1990   Hypercholesteremia    Lump or mass in breast    Malignant neoplasm of upper-outer quadrant of female breast (HCC) 2013   R-breast T1, NO, MO, ER/PR pos., Her 2 neg.   Osteoporosis    Ulcer 1974   Past Surgical History:  Procedure Laterality Date   BREAST SURGERY Right 2013   CATARACT EXTRACTION W/PHACO Right 04/23/2016   Procedure: CATARACT EXTRACTION PHACO AND INTRAOCULAR LENS PLACEMENT (IOC) RIGHT EYE;  Surgeon: Dene Etienne, MD;  Location: [facility];  Service: Ophthalmology;  Laterality: Right;   CATARACT EXTRACTION W/PHACO Left 06/04/2016   Procedure: CATARACT EXTRACTION PHACO AND INTRAOCULAR LENS PLACEMENT (IOC);  Surgeon: Dene Etienne, MD;  Location: [facility];  Service: Ophthalmology;  Laterality: Left;   CESAREAN SECTION  1969   COLONOSCOPY WITH PROPOFOL  N/A 07/22/2017   Procedure: COLONOSCOPY WITH PROPOFOL ;  Surgeon: Dessa Reyes ORN, MD;  Location: [facility] END[facility];  Service: Endoscopy;  Laterality: N/A;   FRACTURE SURGERY Left 2011   arm, plate and 12 screws   LEG SURGERY Right 01/02/2015   MASTECTOMY Right 2013   Right inguinal hernia repair  2013   VEIN SURGERY Left 2015   Patient Active Problem List   Diagnosis  Date Noted   History of diverticulitis 03/12/2020   Left ovarian cyst 03/12/2020   Atherosclerosis of aorta (HCC) 03/12/2020   Osteoporosis 08/05/2019   Closed comminuted intertrochanteric fracture of proximal femur (HCC) 07/27/2018   Mass of upper outer quadrant of left breast 06/01/2018   Goals of care, counseling/discussion 01/31/2018   Osteoporosis without current pathological fracture 07/28/2017   Depression, major, recurrent, mild (HCC) 07/07/2017   B12 deficiency 07/07/2017   Vitamin D  deficiency 07/07/2017   Screening for colon cancer 05/27/2017   Malignant neoplasm of upper-outer quadrant of right breast in female, estrogen receptor positive (HCC) 05/27/2017   Glaucoma 05/26/2017   History of alcoholism (HCC) 05/26/2017   History of hip fracture 05/10/2015   Breast cancer, right (HCC) 05/27/2012   Estrogen receptor positive status (ER+) 05/14/2012    ONSET DATE: 5 years ago  REFERRING DIAG:  R26.89 (ICD-10-CM) - Imbalance  G62.9 (ICD-10-CM) - Neuropathy    THERAPY DIAG:   Dizziness and giddiness  Unsteadiness on feet  Muscle weakness (generalized)  Difficulty in walking, not elsewhere classified  Rationale for Evaluation and Treatment: Rehabilitation  SUBJECTIVE:  SUBJECTIVE STATEMENT:  Pt reports she has had a rough morning due to misplacing an important item (but she was able to find it). Reports having her normal amount of pain. Denies falls. Pt reports her R LE was going numb a little this morning, pt relates it to having increased stress this morning.   Initial Eval: Pt is a pleasant 83 y/o female presenting to PT for imbalance. Pt ambulating with a SPC. She states she initially thought she had vertigo when she started losing her balance where onset of imbalance was  about 5 years ago. Pt noticed imbalance with carrying tea while ambulating; she felt she was going to fall/had someone help her. Pt reports she still gets dizzy off and on. She describes it as weak and sometimes the room moves. When she had an MRI recently she saw the room spin. Pt reports no falls in the last six months, but she has had near-falls where she grabs onto stuff.  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 She has neuropathy affecting bilat LE.  Pt accompanied by: friend Counsellor)  PERTINENT HISTORY: Per chart PMH significant for breast CA (R), hx hip fx, hx alcoholism, depression, B12 deficiency, Vitamin D  deficiency, osteoporosis without current pathological fx, mass of upper quadrant L breast, closed comminuted intertrochanteric fx of proximal femur, atherosclerosis of aorta, new diagnosis of normal pressure hydrocephalus  Neurology MD apt from 03/14/2024: Reviewed results of recent Brain MRI from 01/18/2024 in the office today. Results indicated mild ventriculomegaly with narrowing of the callosal angle. Symptoms indeterminate between normal pressure hydrocephalus and age-related changes at this time. Will continue to monitor at future visits.   Pt states she doesn't want to have a shunt put into her brain.  PAIN:  Are you having pain? General arthritis pain; she reports she takes Advil to help her pain  PRECAUTIONS: Fall, normal pressure hydrocephalus  RED FLAGS: Pt states she wears a diaper due to incontinence and says physician aware   WEIGHT BEARING RESTRICTIONS: No  FALLS: Has patient fallen in last 6 months? No  LIVING ENVIRONMENT: Lives with: lives alone Lives in: House/apartment Stairs: 7 steps to get into home, bilat rails Has following equipment at home: Single point cane, Grab bars, and grab bar in tub but does not use anymore  PLOF: indep with ADLs at home but is getting some help at  work  PATIENT GOALS: she wants to walk without a cane; I don't want to end up in a wheelchair   OBJECTIVE:  Note: Objective measures were completed at Evaluation unless otherwise noted.  DIAGNOSTIC FINDINGS:  EXAM 01/18/2024: MRI HEAD WITHOUT CONTRAST IMPRESSION: 1. Mild ventriculomegaly with narrowing of the callosal angle. Findings can be seen with normal pressure hydrocephalus in the appropriate clinical setting. 2. Chronic small vessel changes. 3. No acute infarct.     Electronically Signed   By: Clem Savory M.D.   On: 02/10/2024 11:19  CT HEAD 10/20/22: FINDINGS: Brain: No acute infarct or hemorrhage. Lateral ventricles and midline structures are stable. No acute extra-axial fluid collections. No mass effect.   Vascular: No hyperdense vessel or unexpected calcification.   Skull: Normal. Negative for fracture or focal lesion.   Sinuses/Orbits: Mild mucoperiosteal thickening throughout the ethmoid air cells. Remaining paranasal sinuses are clear.   Other: None.   IMPRESSION: 1. Ethmoid sinus disease. 2. No acute intracranial process.  Electronically Signed   By: Ozell Daring M.D.   On: 10/20/2022 16:14  COGNITION: Overall cognitive status: Within functional limits for tasks assessed   SENSATION: Pt has neuropathy affecting bilat LE and RUE; she describes sensation as numbness  COORDINATION: WFL BLE and BUE with rapid alt movement, chin<>target; shin>contrlat LE   EDEMA:  She reports every now and then she gets some swelling in BLE has been ongoing 4 years /not new   POSTURE: rounded shoulders, increased thoracic kyphosis    LOWER EXTREMITY MMT:   Grossly 4/5 bilat LE most deficits found proximal musculature   BED MOBILITY:  Rolling makes pt lightheaded   TRANSFERS: Able to complete hands-free in session with SBA  RAMP:  impaired confidence in balance on ramps per ABC questionnaire    STAIRS:  impaired confidence in balance on stairs  per ABC questionnaire   GAIT: See below: decreased gait speed, uses SPC, close CGA without AD, shuffling-like gait/decreased step length and decreased heel strike, limited arm-swing and hip extension bilat, L lateral sway in mid-stance  FUNCTIONAL TESTS:  5 times sit to stand: 13 sec, hands-free  Timed up and go (TUG): 19,5 sec no AD; 14 sec with SPC  10 meter walk test: 0.61 m/s with [facility] Berg Balance Scale: deferred    PATIENT SURVEYS:  ABC scale 58.75%  Vestibular Assessment from 02/22/2024:  OCULOMOTOR EXAM:  Ocular Alignment: normal, some skin drooping around R eye impacting alignment slightly, but relatively symmetrical  Ocular ROM: No Limitations  Spontaneous Nystagmus: absent  Gaze-Induced Nystagmus: absent  Smooth Pursuits: intact  Saccades: slow  Convergence/Divergence: appears normal  Cover/Cross-Cover: WNL  VESTIBULAR - OCULAR REFLEX:   Slow VOR: Normal  VOR Cancellation: Normal, when having pt perform it herself she moved her head more quickly and she said she felt a little lightheadedness  Head-Impulse Test: unable to test due to pt guarding  Dynamic Visual Acuity: to be tested if needed  POSITIONAL TESTING: Right Dix-Hallpike: upbeating, right nystagmus and Duration: <30 seconds Right Sidelying: symptomatic upon return to sitting upright Left Sidelying: no nystagmus and no symptoms  MOTION SENSITIVITY: to be tested if needed  Motion Sensitivity Quotient                                                                                                                              TREATMENT DATE: 05/30/2024  Pt ambulates in therapy clinic using hurricane with short, shuffled gait, and decreased gait speed - noticed L LE step length is shorter than R LE. Supervision provided for patient safety.  Gait training including:   ~1100ft, no UE support, with CGA for steadying  Cuing for longer step lengths and increased gait speed  Pt continues to have shorter step  lengths Continues to have more shuffled step with L LE compared to R LE Pt also improving arm swing, but still in partial guarded posture with increased elbow flexion; however, improving  Dynamic gait  training using agility ladder including the following:  Forward reciprocal stepping Requires R HHA for safety and balance providing min A  3 laps including additional ~4ft of gait training at the end to promote carryover of improved gait mechanics without the external target of the ladder Side stepping  Requires R HHA for min A - progressed to no UE support  3 laps  This is significantly challenging with pt having difficulty taking large enough step to allow room for following LE - more difficult towards R compared to L and improved today compared to last session  B LE functional strengthening and dynamic balance interventions as follows: Alternating heel taps to 1st 6 step at stairs, goal of not using UE support Added 2.5lb AW on each LE 2x8 reps  Heavy min A for steadying due to pt having slight R anterior lean Tapping with heels is significantly more challenging for patient Forward step-ups onto 1st 6 step at stairs using 1 UE support on HR, tapping alternate LE to 2nd step (progressed to 3rd step) to promote reciprocal stepping pattern and increased hip extension activation 2.5lb AW on each LE  2x 12 reps each LE CGA for safety Stair navigation training ascending/descending 4 steps (6 height) using 1 HR with CGA Reciprocal pattern in both directions   Forward reciprocal pattern through agility ladder down x1 then carrying over those improved gait mechanics of longer step lengths to walk out of therapy clinic ~47ft with pt doing well with cue to imagine she is still stepping through the ladder still Able to do it without UE support!! With only light min A for balance   PATIENT EDUCATION: Education details: assessment findings, plan, goals Person educated: Patient and pt's friend  present for eval  Education method: Explanation Education comprehension: verbalized understanding  HOME EXERCISE PROGRAM:  Access Code: [email or web address] URL: [email or web address] Date: 03/28/2024 Prepared by: Connell Kiss  Exercises - Sit to Stand with Counter Support  - 1 x daily - 7 x weekly - 2 sets - 10 reps - Side Stepping with Counter Support  - 1 x daily - 7 x weekly - 3 sets - 10 reps - Lunge with Counter Support  - 1 x daily - 3-5 x weekly - 2 sets - 10 reps - Narrow Stance with Unilateral Counter Support  - 1 x daily - 7 x weekly - 2 sets - 30 seconds hold - Standing Romberg to 1/2 Tandem Stance  - 1 x daily - 7 x weekly - 2 sets - 30 seconds hold   GOALS: Goals reviewed with patient? Yes  SHORT TERM GOALS: Target date: 06/20/2024  Patient will be independent in home exercise program to improve strength/mobility for better functional independence with ADLs. Baseline: initiated on 03/07/2024, updated on 03/28/2024 Goal status: INITIAL   LONG TERM GOALS: Target date: 08/01/2024   Patient will increase ABC scale score >80% to demonstrate better functional mobility and better confidence with ADLs.  Baseline: 58.75% 05/09/2024: 68.125% Goal status: IN PROGRESS  2.  Patient will complete five times sit to stand test in < 10 seconds indicating an increased LE strength and improved balance. Baseline: 13 sec 05/09/2024: 12.08 seconds with arms across chest Goal status: IN PROGRESS  3.  Patient will increase Berg Balance score by > 6 points to demonstrate decreased fall risk during functional activities Baseline: 02/29/2024: 36/56 05/09/2024: 36/56  Goal status: IN PROGRESS  4.  Patient will increase 10 meter walk test to >1.20m/s as to improve gait speed  for better community ambulation and to reduce fall risk. Baseline: 0.61 m/s with [facility] 05/09/2024: Average Normal speed: 0.76 m/s & Average Fast speed: 0.89 m/s using hurricane 05/23/2024: Average Normal speed: 0.9165 m/s &  Average Fast speed: 0.90 m/s using hurricane with SBA   Goal status: IN PROGRESS  5.  Patient will reduce timed up and go to <11 seconds to reduce fall risk and demonstrate improved transfer/gait ability. Baseline: 19.5 sec no AD, 14 sec with SPC 05/09/2024: 16.36 seconds using hurricane with SBA 05/23/2024:  Goal status: IN PROGRESS    ASSESSMENT:  CLINICAL IMPRESSION:   Patient is a pleasant 83 y.o. female who was seen today for physical therapy treatment for imbalance and neuropathy. Therapy session focused on gait training to promote longer step lengths and increased stance phase time as pt with very quick, short steps forward (L more impaired than R). Continued participation in dynamic gait training using agility ladder today with pt demonstrating significant improvement in reciprocal stepping pattern with longer stance phase times and longer step lengths; however, does require min A via R HHA in the beginning for balance and confidence, but is able to complete 1x without UE support at end of session! Patient also progressed to performing alternating heel taps to 1st step on stairs with this being significantly more challenging than toe taps and will benefit from continuation of this. Ms. Candy will benefit from further skilled PT to improve impairments in order to decrease fall risk, and increase QOL and ease and safety with ADLs.    OBJECTIVE IMPAIRMENTS: Abnormal gait, decreased activity tolerance, decreased balance, decreased mobility, difficulty walking, decreased ROM, decreased strength, dizziness, impaired sensation, impaired UE functional use, improper body mechanics, postural dysfunction, and pain.   ACTIVITY LIMITATIONS: carrying, lifting, bending, standing, squatting, stairs, transfers, bed mobility, bathing, and locomotion level  PARTICIPATION LIMITATIONS: meal prep, cleaning, shopping, community activity, occupation, and yard work  PERSONAL FACTORS: Age, Fitness, Sex, Time  since onset of injury/illness/exacerbation, and 3+ comorbidities: Per chart PMH significant for breast CA (R), hx hip fx, hx alcoholism, depression, B12 deficiency, Vitamin D  deficiency, osteoporosis without current pathological fx, mass of upper quadrant L breast, closed comminuted intertrochanteric fx of proximal femur, atherosclerosis of aorta are also affecting patient's functional outcome.   REHAB POTENTIAL: Good  CLINICAL DECISION MAKING: Evolving/moderate complexity  EVALUATION COMPLEXITY: Moderate  PLAN:  PT FREQUENCY: 1-2x/week  PT DURATION: 12 weeks  PLANNED INTERVENTIONS: 97164- PT Re-evaluation, 97750- Physical Performance Testing, 97110-Therapeutic exercises, 97530- Therapeutic activity, [id number]- Neuromuscular re-education, 97535- Self Care, [id number]- Manual therapy, [id number]- Gait training, [phone]- Orthotic Initial, [phone]- Orthotic/Prosthetic subsequent, [phone]- Canalith repositioning, [phone]- Splinting, [phone]- Electrical stimulation (manual), Patient/Family education, Balance training, Stair training, Taping, Joint mobilization, Spinal mobilization, Vestibular training, DME instructions, Wheelchair mobility training, Cryotherapy, and Moist heat    PLAN FOR NEXT SESSION:  - review HEP - standing balance:  - reaching activities   - turning 360 degrees!  - alternating foot taps   - progression to semi-tandem  - fwd/back & side stepping over obstacles - dynamic gait training  - need to include turning more  - continue use of agility ladder  - focus on heel strike     Tahji Silverthorne, PT, DPT, NCS, CSRS Physical Therapist - [facility]  [facility]  8:45 AM 05/30/24

## 2024-06-06 ENCOUNTER — Ambulatory Visit: Admitting: Physical Therapy

## 2024-06-06 ENCOUNTER — Encounter: Payer: Self-pay | Admitting: Urology

## 2024-06-06 ENCOUNTER — Ambulatory Visit: Admitting: Urology

## 2024-06-06 VITALS — BP 123/79 | HR 79 | Ht 63.0 in | Wt 147.6 lb

## 2024-06-06 DIAGNOSIS — R42 Dizziness and giddiness: Secondary | ICD-10-CM | POA: Diagnosis not present

## 2024-06-06 DIAGNOSIS — M6281 Muscle weakness (generalized): Secondary | ICD-10-CM | POA: Diagnosis not present

## 2024-06-06 DIAGNOSIS — R262 Difficulty in walking, not elsewhere classified: Secondary | ICD-10-CM

## 2024-06-06 DIAGNOSIS — R32 Unspecified urinary incontinence: Secondary | ICD-10-CM | POA: Diagnosis not present

## 2024-06-06 DIAGNOSIS — R2681 Unsteadiness on feet: Secondary | ICD-10-CM

## 2024-06-06 LAB — URINALYSIS, COMPLETE
Bilirubin, UA: NEGATIVE
Glucose, UA: NEGATIVE
Ketones, UA: NEGATIVE
Leukocytes,UA: NEGATIVE
Nitrite, UA: NEGATIVE
Protein,UA: NEGATIVE
RBC, UA: NEGATIVE
Specific Gravity, UA: <1.005 — ABNORMAL LOW (ref 1.005–1.030)
Urobilinogen, Ur: 0.2 mg/dL (ref 0.2–1.0)
pH, UA: 6 (ref 5.0–7.5)

## 2024-06-06 LAB — MICROSCOPIC EXAMINATION
Bacteria, UA: NONE SEEN
RBC, Urine: NONE SEEN /HPF (ref 0–2)

## 2024-06-06 MED ORDER — GEMTESA 75 MG PO TABS: 75.0000 mg | ORAL_TABLET | Freq: Every day | ORAL | 11 refills | Status: DC

## 2024-06-06 MED ORDER — GEMTESA 75 MG PO TABS: 75.0000 mg | ORAL_TABLET | Freq: Every day | ORAL | Status: AC

## 2024-06-06 NOTE — Progress Notes (Addendum)
 06/06/2024 9:59 AM   Rebecca Faulkner [birth year] [id number]  Referring provider: Glenard Mire, MD [address] [place],  [place] [address]  No chief complaint on file.   HPI: Was consulted to assist the patient's urinary incontinence.  She primarily has urge incontinence.  She does not leak with coughing sneezing.  She has no bedwetting but can have foot on the floor syndrome.  She wears 3-4 pads a day moderately wet.  Oxybutynin is helping a modest amount but causing her to be very thirsty  She voids every 2-3 hours and gets up once at night.  Flow is reasonable and improved as she is holding more on the oxybutynin  No hysterectomy  No history of kidney stones bladder surgery or bladder infections.  No neurologic issues   PMH: Past Medical History:  Diagnosis Date   Breast cancer (HCC) 2013   right breast   Heart murmur 1990   Hypercholesteremia    Lump or mass in breast    Malignant neoplasm of upper-outer quadrant of female breast (HCC) 2013   R-breast T1, NO, MO, ER/PR pos., Her 2 neg.   Osteoporosis    Ulcer 1974    Surgical History: Past Surgical History:  Procedure Laterality Date   BREAST SURGERY Right 2013   CATARACT EXTRACTION W/PHACO Right 04/23/2016   Procedure: CATARACT EXTRACTION PHACO AND INTRAOCULAR LENS PLACEMENT (IOC) RIGHT EYE;  Surgeon: Dene Etienne, MD;  Location: [facility];  Service: Ophthalmology;  Laterality: Right;   CATARACT EXTRACTION W/PHACO Left 06/04/2016   Procedure: CATARACT EXTRACTION PHACO AND INTRAOCULAR LENS PLACEMENT (IOC);  Surgeon: Dene Etienne, MD;  Location: [facility];  Service: Ophthalmology;  Laterality: Left;   CESAREAN SECTION  1969   COLONOSCOPY WITH PROPOFOL  N/A 07/22/2017   Procedure: COLONOSCOPY WITH PROPOFOL ;  Surgeon: Dessa Reyes ORN, MD;  Location: [facility] END[facility];  Service: Endoscopy;  Laterality: N/A;   FRACTURE SURGERY Left 2011   arm, plate and 12 screws   LEG  SURGERY Right 01/02/2015   MASTECTOMY Right 2013   Right inguinal hernia repair  2013   VEIN SURGERY Left 2015    Home Medications:  Allergies as of 06/06/2024       Reactions   Codeine Nausea Only   Pravastatin     Constipation         Medication List        Accurate as of June 06, 2024  9:59 AM. If you have any questions, ask your nurse or doctor.          cholecalciferol 25 MCG (1000 UNIT) tablet Commonly known as: VITAMIN D3 Take 1,000 Units by mouth daily.   gabapentin 100 MG capsule Commonly known as: NEURONTIN Take 100 mg in the morning and 200 mg at night.   Magnesium Glycinate 100 MG Caps Take 200 mg by mouth.   multivitamin with minerals tablet Take 1 tablet by mouth daily.   promethazine -dextromethorphan 6.25-15 MG/5ML syrup Commonly known as: PROMETHAZINE -DM Take 2.5-5 mLs by mouth 2 (two) times daily as needed for cough.   simvastatin  40 MG tablet Commonly known as: ZOCOR  Take 1 tablet (40 mg total) by mouth at bedtime.        Allergies:  Allergies  Allergen Reactions   Codeine Nausea Only   Pravastatin      Constipation     Family History: Family History  Problem Relation Age of Onset   Dementia Mother    Stroke Father    Diabetes Brother  Breast cancer Neg Hx     Social History:  reports that she has never smoked. She has never used smokeless tobacco. She reports that she does not drink alcohol and does not use drugs.  ROS:                                        Physical Exam: There were no vitals taken for this visit.  Constitutional:  Alert and oriented, No acute distress. HEENT: [place] AT, moist mucus membranes.  Trachea midline, no masses.   Laboratory Data: Lab Results  Component Value Date   WBC 12.3 (H) 11/13/2023   HGB 15.4 (H) 11/13/2023   HCT 46.4 (H) 11/13/2023   MCV 90.6 11/13/2023   PLT 248 11/13/2023    Lab Results  Component Value Date   CREATININE 0.75 11/13/2023    No  results found for: PSA  No results found for: TESTOSTERONE  Lab Results  Component Value Date   HGBA1C 5.8 (H) 11/10/2022    Urinalysis    Component Value Date/Time   COLORURINE YELLOW (A) 11/13/2023 0745   APPEARANCEUR HAZY (A) 11/13/2023 0745   APPEARANCEUR Clear 11/10/2013 1205   LABSPEC 1.027 11/13/2023 0745   LABSPEC 1.014 11/10/2013 1205   PHURINE 5.0 11/13/2023 0745   GLUCOSEU NEGATIVE 11/13/2023 0745   GLUCOSEU Negative 11/10/2013 1205   HGBUR SMALL (A) 11/13/2023 0745   BILIRUBINUR NEGATIVE 11/13/2023 0745   BILIRUBINUR Negative 11/10/2013 1205   KETONESUR NEGATIVE 11/13/2023 0745   PROTEINUR 30 (A) 11/13/2023 0745   NITRITE NEGATIVE 11/13/2023 0745   LEUKOCYTESUR NEGATIVE 11/13/2023 0745   LEUKOCYTESUR Negative 11/10/2013 1205    Pertinent Imaging: Urine reviewed and sent for culture.  Chart reviewed  Assessment & Plan: Patient has an overactive bladder.  She will return on Gemtesa  samples and prescription for pelvic examination and cystoscopy and we will proceed accordingly.  She has urge incontinence for the floor syndrome and milder frequency.  Call if urine culture positive    1. Urinary incontinence, unspecified type (Primary)    No follow-ups on file.  Glendia DELENA Elizabeth, MD  [facility] [address], [address] [place], [place] [address] [phone]

## 2024-06-06 NOTE — Therapy (Signed)
 OUTPATIENT PHYSICAL THERAPY NEURO TREATMENT   Patient Name: Rebecca Faulkner MRN: [id number] DOB:1941/06/22, 83 y.o., female Today's Date: 06/06/2024   PCP: Bernardo Fend, DO REFERRING PROVIDER: Lane Arthea BRAVO, MD  END OF SESSION:   PT End of Session - 06/06/24 0804     Visit Number 12    Number of Visits 49    Date for PT Re-Evaluation 08/01/24    PT Start Time 0804    PT Stop Time 0844    PT Time Calculation (min) 40 min    Equipment Utilized During Treatment Gait belt    Activity Tolerance Patient tolerated treatment well    Behavior During Therapy [facility] for tasks assessed/performed            Past Medical History:  Diagnosis Date   Breast cancer (HCC) 2013   right breast   Heart murmur 1990   Hypercholesteremia    Lump or mass in breast    Malignant neoplasm of upper-outer quadrant of female breast (HCC) 2013   R-breast T1, NO, MO, ER/PR pos., Her 2 neg.   Osteoporosis    Ulcer 1974   Past Surgical History:  Procedure Laterality Date   BREAST SURGERY Right 2013   CATARACT EXTRACTION W/PHACO Right 04/23/2016   Procedure: CATARACT EXTRACTION PHACO AND INTRAOCULAR LENS PLACEMENT (IOC) RIGHT EYE;  Surgeon: Dene Etienne, MD;  Location: [facility];  Service: Ophthalmology;  Laterality: Right;   CATARACT EXTRACTION W/PHACO Left 06/04/2016   Procedure: CATARACT EXTRACTION PHACO AND INTRAOCULAR LENS PLACEMENT (IOC);  Surgeon: Dene Etienne, MD;  Location: [facility];  Service: Ophthalmology;  Laterality: Left;   CESAREAN SECTION  1969   COLONOSCOPY WITH PROPOFOL  N/A 07/22/2017   Procedure: COLONOSCOPY WITH PROPOFOL ;  Surgeon: Dessa Reyes ORN, MD;  Location: [facility];  Service: Endoscopy;  Laterality: N/A;   FRACTURE SURGERY Left 2011   arm, plate and 12 screws   LEG SURGERY Right 01/02/2015   MASTECTOMY Right 2013   Right inguinal hernia repair  2013   VEIN SURGERY Left 2015   Patient Active Problem List   Diagnosis Date  Noted   History of diverticulitis 03/12/2020   Left ovarian cyst 03/12/2020   Atherosclerosis of aorta (HCC) 03/12/2020   Osteoporosis 08/05/2019   Closed comminuted intertrochanteric fracture of proximal femur (HCC) 07/27/2018   Mass of upper outer quadrant of left breast 06/01/2018   Goals of care, counseling/discussion 01/31/2018   Osteoporosis without current pathological fracture 07/28/2017   Depression, major, recurrent, mild (HCC) 07/07/2017   B12 deficiency 07/07/2017   Vitamin D  deficiency 07/07/2017   Screening for colon cancer 05/27/2017   Malignant neoplasm of upper-outer quadrant of right breast in female, estrogen receptor positive (HCC) 05/27/2017   Glaucoma 05/26/2017   History of alcoholism (HCC) 05/26/2017   History of hip fracture 05/10/2015   Breast cancer, right (HCC) 05/27/2012   Estrogen receptor positive status (ER+) 05/14/2012    ONSET DATE: 5 years ago  REFERRING DIAG:  R26.89 (ICD-10-CM) - Imbalance  G62.9 (ICD-10-CM) - Neuropathy    THERAPY DIAG:   Dizziness and giddiness  Unsteadiness on feet  Muscle weakness (generalized)  Difficulty in walking, not elsewhere classified  Rationale for Evaluation and Treatment: Rehabilitation  SUBJECTIVE:  SUBJECTIVE STATEMENT:  Pt states she hurts when it rains and so she isn't feeling very well this morning. Pt continues to report she doesn't have time to do her HEP. Pt states she walks to/from bathroom at work without using her cane and tries to focus on taking big steps. Denies falls.    Initial Eval: Pt is a pleasant 83 y/o female presenting to PT for imbalance. Pt ambulating with a SPC. She states she initially thought she had vertigo when she started losing her balance where onset of imbalance was about 5 years  ago. Pt noticed imbalance with carrying tea while ambulating; she felt she was going to fall/had someone help her. Pt reports she still gets dizzy off and on. She describes it as weak and sometimes the room moves. When she had an MRI recently she saw the room spin. Pt reports no falls in the last six months, but she has had near-falls where she grabs onto stuff.  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 She has neuropathy affecting bilat LE.  Pt accompanied by: friend Counsellor)  PERTINENT HISTORY: Per chart PMH significant for breast CA (R), hx hip fx, hx alcoholism, depression, B12 deficiency, Vitamin D  deficiency, osteoporosis without current pathological fx, mass of upper quadrant L breast, closed comminuted intertrochanteric fx of proximal femur, atherosclerosis of aorta, new diagnosis of normal pressure hydrocephalus  Neurology MD apt from 03/14/2024: Reviewed results of recent Brain MRI from 01/18/2024 in the office today. Results indicated mild ventriculomegaly with narrowing of the callosal angle. Symptoms indeterminate between normal pressure hydrocephalus and age-related changes at this time. Will continue to monitor at future visits.   Pt states she doesn't want to have a shunt put into her brain.  PAIN:  Are you having pain? General arthritis pain; she reports she takes Advil to help her pain  PRECAUTIONS: Fall, normal pressure hydrocephalus  RED FLAGS: Pt states she wears a diaper due to incontinence and says physician aware   WEIGHT BEARING RESTRICTIONS: No  FALLS: Has patient fallen in last 6 months? No  LIVING ENVIRONMENT: Lives with: lives alone Lives in: House/apartment Stairs: 7 steps to get into home, bilat rails Has following equipment at home: Single point cane, Grab bars, and grab bar in tub but does not use anymore  PLOF: indep with ADLs at home but is getting some help at work  PATIENT  GOALS: she wants to walk without a cane; I don't want to end up in a wheelchair   OBJECTIVE:  Note: Objective measures were completed at Evaluation unless otherwise noted.  DIAGNOSTIC FINDINGS:  EXAM 01/18/2024: MRI HEAD WITHOUT CONTRAST IMPRESSION: 1. Mild ventriculomegaly with narrowing of the callosal angle. Findings can be seen with normal pressure hydrocephalus in the appropriate clinical setting. 2. Chronic small vessel changes. 3. No acute infarct.     Electronically Signed   By: Clem Savory M.D.   On: 02/10/2024 11:19  CT HEAD 10/20/22: FINDINGS: Brain: No acute infarct or hemorrhage. Lateral ventricles and midline structures are stable. No acute extra-axial fluid collections. No mass effect.   Vascular: No hyperdense vessel or unexpected calcification.   Skull: Normal. Negative for fracture or focal lesion.   Sinuses/Orbits: Mild mucoperiosteal thickening throughout the ethmoid air cells. Remaining paranasal sinuses are clear.   Other: None.   IMPRESSION: 1. Ethmoid sinus disease. 2. No acute intracranial process.  Electronically Signed   By: Ozell Daring M.D.   On: 10/20/2022 16:14  COGNITION: Overall cognitive status: Within functional limits for tasks assessed   SENSATION: Pt has neuropathy affecting bilat LE and RUE; she describes sensation as numbness  COORDINATION: WFL BLE and BUE with rapid alt movement, chin<>target; shin>contrlat LE   EDEMA:  She reports every now and then she gets some swelling in BLE has been ongoing 4 years /not new   POSTURE: rounded shoulders, increased thoracic kyphosis    LOWER EXTREMITY MMT:   Grossly 4/5 bilat LE most deficits found proximal musculature   BED MOBILITY:  Rolling makes pt lightheaded   TRANSFERS: Able to complete hands-free in session with SBA  RAMP:  impaired confidence in balance on ramps per ABC questionnaire    STAIRS:  impaired confidence in balance on stairs per ABC  questionnaire   GAIT: See below: decreased gait speed, uses SPC, close CGA without AD, shuffling-like gait/decreased step length and decreased heel strike, limited arm-swing and hip extension bilat, L lateral sway in mid-stance  FUNCTIONAL TESTS:  5 times sit to stand: 13 sec, hands-free  Timed up and go (TUG): 19,5 sec no AD; 14 sec with SPC  10 meter walk test: 0.61 m/s with [facility] Berg Balance Scale: deferred    PATIENT SURVEYS:  ABC scale 58.75%  Vestibular Assessment from 02/22/2024:  OCULOMOTOR EXAM:  Ocular Alignment: normal, some skin drooping around R eye impacting alignment slightly, but relatively symmetrical  Ocular ROM: No Limitations  Spontaneous Nystagmus: absent  Gaze-Induced Nystagmus: absent  Smooth Pursuits: intact  Saccades: slow  Convergence/Divergence: appears normal  Cover/Cross-Cover: WNL  VESTIBULAR - OCULAR REFLEX:   Slow VOR: Normal  VOR Cancellation: Normal, when having pt perform it herself she moved her head more quickly and she said she felt a little lightheadedness  Head-Impulse Test: unable to test due to pt guarding  Dynamic Visual Acuity: to be tested if needed  POSITIONAL TESTING: Right Dix-Hallpike: upbeating, right nystagmus and Duration: <30 seconds Right Sidelying: symptomatic upon return to sitting upright Left Sidelying: no nystagmus and no symptoms  MOTION SENSITIVITY: to be tested if needed  Motion Sensitivity Quotient                                                                                                                              TREATMENT DATE: 06/06/2024  Pt ambulates into therapy clinic using hurricane with short, shuffled gait, and decreased gait speed - noticed L LE step length is shorter than R LE. Supervision provided for patient safety.  Gait training including:   3 laps from green lines (~167ft), no UE support and with close SBA for safety!! Cuing for longer step lengths and increased gait speed  Pt  continues to have shorter step lengths - especially as she fatigues towards end Continues to have more shuffled step with L LE compared to R LE Pt also improving arm swing, but still  in partial guarded posture with increased elbow flexion; however, improving  Dynamic gait training using agility ladder including the following:  Forward reciprocal stepping 3 laps  Requires R HHA for safety and balance providing min A progressed to only hand hovering last lap! Side stepping  2 laps + 1 lap (seated break) Reviewed goal of external target aiming towards next ladder reign to increase step width, with pt demonstrating improvement towards L Light min A for balance, but pt not requiring HHA today!  This is significantly challenging with pt having difficulty taking large enough steps to allow room for following LE - more difficult towards L compared to R - but pt able to decrease compensation of turning hips towards directions she is stepping  B LE functional strengthening and dynamic balance interventions as follows: Forward/backwards stepping over PVC pipe progressed to 1/2 foam roll Light min A for balance Next to balance bar on R side with pt more frequently using R UE support when stepping backwards leading with R LE, but goal of no UE support Does well consistently clearing foot over obstacle  Side stepping for hip abductor strengthening using RTB resistance around thighs 3 laps ~10steps each direction Pt reports feeling this in target muscle groups    PATIENT EDUCATION: Education details: assessment findings, plan, goals Person educated: Patient and pt's friend present for eval  Education method: Explanation Education comprehension: verbalized understanding  HOME EXERCISE PROGRAM:  Access Code: [email or web address] URL: [email or web address] Date: 03/28/2024 Prepared by: Connell Kiss  Exercises - Sit to Stand with Counter Support  - 1 x daily - 7 x weekly - 2 sets - 10 reps -  Side Stepping with Counter Support  - 1 x daily - 7 x weekly - 3 sets - 10 reps - Lunge with Counter Support  - 1 x daily - 3-5 x weekly - 2 sets - 10 reps - Narrow Stance with Unilateral Counter Support  - 1 x daily - 7 x weekly - 2 sets - 30 seconds hold - Standing Romberg to 1/2 Tandem Stance  - 1 x daily - 7 x weekly - 2 sets - 30 seconds hold   GOALS: Goals reviewed with patient? Yes  SHORT TERM GOALS: Target date: 06/20/2024  Patient will be independent in home exercise program to improve strength/mobility for better functional independence with ADLs. Baseline: initiated on 03/07/2024, updated on 03/28/2024 Goal status: INITIAL   LONG TERM GOALS: Target date: 08/01/2024   Patient will increase ABC scale score >80% to demonstrate better functional mobility and better confidence with ADLs.  Baseline: 58.75% 05/09/2024: 68.125% Goal status: IN PROGRESS  2.  Patient will complete five times sit to stand test in < 10 seconds indicating an increased LE strength and improved balance. Baseline: 13 sec 05/09/2024: 12.08 seconds with arms across chest Goal status: IN PROGRESS  3.  Patient will increase Berg Balance score by > 6 points to demonstrate decreased fall risk during functional activities Baseline: 02/29/2024: 36/56 05/09/2024: 36/56  Goal status: IN PROGRESS  4.  Patient will increase 10 meter walk test to >1.43m/s as to improve gait speed for better community ambulation and to reduce fall risk. Baseline: 0.61 m/s with [facility] 05/09/2024: Average Normal speed: 0.76 m/s & Average Fast speed: 0.89 m/s using hurricane 05/23/2024: Average Normal speed: 0.9165 m/s & Average Fast speed: 0.90 m/s using hurricane with SBA   Goal status: IN PROGRESS  5.  Patient will reduce timed up and go to <11 seconds to reduce fall  risk and demonstrate improved transfer/gait ability. Baseline: 19.5 sec no AD, 14 sec with SPC 05/09/2024: 16.36 seconds using hurricane with SBA 05/23/2024:  Goal status: IN  PROGRESS    ASSESSMENT:  CLINICAL IMPRESSION:   Patient is a pleasant 83 y.o. female who was seen today for physical therapy treatment for imbalance and neuropathy. Therapy session focused on gait training to promote longer step lengths and increased stance phase times as pt with very quick, short steps forward (L more impaired than R). Continued participation in dynamic gait training using agility ladder today with pt demonstrating significant improvement in reciprocal stepping pattern with longer stance phase times and longer step lengths and pt even able to progress to no UE support with this intervention today! Patient also able to participate in side stepping through agility ladder without UE support and improved step width when aiming towards external target of following ladder reign. Patient also repeated forward/backwards stepping over PVC pipe progressed to 1/2 foam roll, which pt tried earlier in her POC and was unsuccessful with, but today is able to perform with only intermittent RUE support, primarily when stepping back leading with RLE. Ms. Goon will benefit from further skilled PT to improve impairments in order to decrease fall risk, and increase QOL and ease and safety with ADLs.    OBJECTIVE IMPAIRMENTS: Abnormal gait, decreased activity tolerance, decreased balance, decreased mobility, difficulty walking, decreased ROM, decreased strength, dizziness, impaired sensation, impaired UE functional use, improper body mechanics, postural dysfunction, and pain.   ACTIVITY LIMITATIONS: carrying, lifting, bending, standing, squatting, stairs, transfers, bed mobility, bathing, and locomotion level  PARTICIPATION LIMITATIONS: meal prep, cleaning, shopping, community activity, occupation, and yard work  PERSONAL FACTORS: Age, Fitness, Sex, Time since onset of injury/illness/exacerbation, and 3+ comorbidities: Per chart PMH significant for breast CA (R), hx hip fx, hx alcoholism, depression,  B12 deficiency, Vitamin D  deficiency, osteoporosis without current pathological fx, mass of upper quadrant L breast, closed comminuted intertrochanteric fx of proximal femur, atherosclerosis of aorta are also affecting patient's functional outcome.   REHAB POTENTIAL: Good  CLINICAL DECISION MAKING: Evolving/moderate complexity  EVALUATION COMPLEXITY: Moderate  PLAN:  PT FREQUENCY: 1-2x/week  PT DURATION: 12 weeks  PLANNED INTERVENTIONS: 97164- PT Re-evaluation, 97750- Physical Performance Testing, 97110-Therapeutic exercises, 97530- Therapeutic activity, [id number]- Neuromuscular re-education, 97535- Self Care, [id number]- Manual therapy, [id number]- Gait training, [phone]- Orthotic Initial, [phone]- Orthotic/Prosthetic subsequent, [phone]- Canalith repositioning, [phone]- Splinting, [phone]- Electrical stimulation (manual), Patient/Family education, Balance training, Stair training, Taping, Joint mobilization, Spinal mobilization, Vestibular training, DME instructions, Wheelchair mobility training, Cryotherapy, and Moist heat    PLAN FOR NEXT SESSION:  - review HEP - standing balance:  - reaching activities   - turning 360 degrees!  - alternating foot taps   - progression to semi-tandem  - fwd/back & side stepping over obstacles - dynamic gait training  - need to include turning more   - figure 8 gait?  - continue use of agility ladder  - focus on heel strike     Julie-Anne Torain, PT, DPT, NCS, CSRS Physical Therapist - [facility]  [facility]  8:45 AM 06/06/24

## 2024-06-08 LAB — CULTURE, URINE COMPREHENSIVE

## 2024-06-13 ENCOUNTER — Ambulatory Visit: Admitting: Physical Therapy

## 2024-06-20 ENCOUNTER — Ambulatory Visit: Admitting: Physical Therapy

## 2024-06-20 DIAGNOSIS — R42 Dizziness and giddiness: Secondary | ICD-10-CM | POA: Diagnosis not present

## 2024-06-20 DIAGNOSIS — Z1331 Encounter for screening for depression: Secondary | ICD-10-CM | POA: Diagnosis not present

## 2024-06-20 DIAGNOSIS — R262 Difficulty in walking, not elsewhere classified: Secondary | ICD-10-CM

## 2024-06-20 DIAGNOSIS — M6281 Muscle weakness (generalized): Secondary | ICD-10-CM

## 2024-06-20 DIAGNOSIS — R200 Anesthesia of skin: Secondary | ICD-10-CM | POA: Diagnosis not present

## 2024-06-20 DIAGNOSIS — R2689 Other abnormalities of gait and mobility: Secondary | ICD-10-CM | POA: Diagnosis not present

## 2024-06-20 DIAGNOSIS — R269 Unspecified abnormalities of gait and mobility: Secondary | ICD-10-CM | POA: Diagnosis not present

## 2024-06-20 DIAGNOSIS — R2681 Unsteadiness on feet: Secondary | ICD-10-CM

## 2024-06-20 DIAGNOSIS — R531 Weakness: Secondary | ICD-10-CM | POA: Diagnosis not present

## 2024-06-20 NOTE — Therapy (Signed)
 OUTPATIENT PHYSICAL THERAPY NEURO TREATMENT   Patient Name: Rebecca Faulkner MRN: [id number] DOB:Mar 10, 1941, 83 y.o., female Today's Date: 06/20/2024   PCP: Bernardo Fend, DO REFERRING PROVIDER: Lane Arthea BRAVO, MD  END OF SESSION:   PT End of Session - 06/20/24 0803     Visit Number 13    Number of Visits 49    Date for PT Re-Evaluation 08/01/24    PT Start Time 0803    PT Stop Time 0845    PT Time Calculation (min) 42 min    Equipment Utilized During Treatment Gait belt    Activity Tolerance Patient tolerated treatment well    Behavior During Therapy [facility] for tasks assessed/performed             Past Medical History:  Diagnosis Date   Breast cancer (HCC) 2013   right breast   Heart murmur 1990   Hypercholesteremia    Lump or mass in breast    Malignant neoplasm of upper-outer quadrant of female breast (HCC) 2013   R-breast T1, NO, MO, ER/PR pos., Her 2 neg.   Osteoporosis    Ulcer 1974   Past Surgical History:  Procedure Laterality Date   BREAST SURGERY Right 2013   CATARACT EXTRACTION W/PHACO Right 04/23/2016   Procedure: CATARACT EXTRACTION PHACO AND INTRAOCULAR LENS PLACEMENT (IOC) RIGHT EYE;  Surgeon: Dene Etienne, MD;  Location: [facility];  Service: Ophthalmology;  Laterality: Right;   CATARACT EXTRACTION W/PHACO Left 06/04/2016   Procedure: CATARACT EXTRACTION PHACO AND INTRAOCULAR LENS PLACEMENT (IOC);  Surgeon: Dene Etienne, MD;  Location: [facility];  Service: Ophthalmology;  Laterality: Left;   CESAREAN SECTION  1969   COLONOSCOPY WITH PROPOFOL  N/A 07/22/2017   Procedure: COLONOSCOPY WITH PROPOFOL ;  Surgeon: Dessa Reyes ORN, MD;  Location: [facility];  Service: Endoscopy;  Laterality: N/A;   FRACTURE SURGERY Left 2011   arm, plate and 12 screws   LEG SURGERY Right 01/02/2015   MASTECTOMY Right 2013   Right inguinal hernia repair  2013   VEIN SURGERY Left 2015   Patient Active Problem List   Diagnosis  Date Noted   History of diverticulitis 03/12/2020   Left ovarian cyst 03/12/2020   Atherosclerosis of aorta (HCC) 03/12/2020   Osteoporosis 08/05/2019   Closed comminuted intertrochanteric fracture of proximal femur (HCC) 07/27/2018   Mass of upper outer quadrant of left breast 06/01/2018   Goals of care, counseling/discussion 01/31/2018   Osteoporosis without current pathological fracture 07/28/2017   Depression, major, recurrent, mild (HCC) 07/07/2017   B12 deficiency 07/07/2017   Vitamin D  deficiency 07/07/2017   Screening for colon cancer 05/27/2017   Malignant neoplasm of upper-outer quadrant of right breast in female, estrogen receptor positive (HCC) 05/27/2017   Glaucoma 05/26/2017   History of alcoholism (HCC) 05/26/2017   History of hip fracture 05/10/2015   Breast cancer, right (HCC) 05/27/2012   Estrogen receptor positive status (ER+) 05/14/2012    ONSET DATE: 5 years ago  REFERRING DIAG:  R26.89 (ICD-10-CM) - Imbalance  G62.9 (ICD-10-CM) - Neuropathy    THERAPY DIAG:   Dizziness and giddiness  Unsteadiness on feet  Muscle weakness (generalized)  Difficulty in walking, not elsewhere classified  Rationale for Evaluation and Treatment: Rehabilitation  SUBJECTIVE:  SUBJECTIVE STATEMENT:  Pt states her dog got out of the house on Saturday and she had to go walk out in the yard with her cane to try and get him back inside. Pt states she did stumble several times, but was able to catch herself and did not fall. Pt states she is still working on taking larger steps.   Initial Eval: Pt is a pleasant 83 y/o female presenting to PT for imbalance. Pt ambulating with a SPC. She states she initially thought she had vertigo when she started losing her balance where onset of imbalance was  about 5 years ago. Pt noticed imbalance with carrying tea while ambulating; she felt she was going to fall/had someone help her. Pt reports she still gets dizzy off and on. She describes it as weak and sometimes the room moves. When she had an MRI recently she saw the room spin. Pt reports no falls in the last six months, but she has had near-falls where she grabs onto stuff.  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 She has neuropathy affecting bilat LE.  Pt accompanied by: friend Counsellor)  PERTINENT HISTORY: Per chart PMH significant for breast CA (R), hx hip fx, hx alcoholism, depression, B12 deficiency, Vitamin D  deficiency, osteoporosis without current pathological fx, mass of upper quadrant L breast, closed comminuted intertrochanteric fx of proximal femur, atherosclerosis of aorta, new diagnosis of normal pressure hydrocephalus  Neurology MD apt from 03/14/2024: Reviewed results of recent Brain MRI from 01/18/2024 in the office today. Results indicated mild ventriculomegaly with narrowing of the callosal angle. Symptoms indeterminate between normal pressure hydrocephalus and age-related changes at this time. Will continue to monitor at future visits.   Pt states she doesn't want to have a shunt put into her brain.  PAIN:  Are you having pain? General arthritis pain; she reports she takes Advil to help her pain  PRECAUTIONS: Fall, normal pressure hydrocephalus  RED FLAGS: Pt states she wears a diaper due to incontinence and says physician aware   WEIGHT BEARING RESTRICTIONS: No  FALLS: Has patient fallen in last 6 months? No  LIVING ENVIRONMENT: Lives with: lives alone Lives in: House/apartment Stairs: 7 steps to get into home, bilat rails Has following equipment at home: Single point cane, Grab bars, and grab bar in tub but does not use anymore  PLOF: indep with ADLs at home but is getting some help at  work  PATIENT GOALS: she wants to walk without a cane; I don't want to end up in a wheelchair   OBJECTIVE:  Note: Objective measures were completed at Evaluation unless otherwise noted.  DIAGNOSTIC FINDINGS:  EXAM 01/18/2024: MRI HEAD WITHOUT CONTRAST IMPRESSION: 1. Mild ventriculomegaly with narrowing of the callosal angle. Findings can be seen with normal pressure hydrocephalus in the appropriate clinical setting. 2. Chronic small vessel changes. 3. No acute infarct.     Electronically Signed   By: Clem Savory M.D.   On: 02/10/2024 11:19  CT HEAD 10/20/22: FINDINGS: Brain: No acute infarct or hemorrhage. Lateral ventricles and midline structures are stable. No acute extra-axial fluid collections. No mass effect.   Vascular: No hyperdense vessel or unexpected calcification.   Skull: Normal. Negative for fracture or focal lesion.   Sinuses/Orbits: Mild mucoperiosteal thickening throughout the ethmoid air cells. Remaining paranasal sinuses are clear.   Other: None.   IMPRESSION: 1. Ethmoid sinus disease. 2. No  acute intracranial process.     Electronically Signed   By: Ozell Daring M.D.   On: 10/20/2022 16:14  COGNITION: Overall cognitive status: Within functional limits for tasks assessed   SENSATION: Pt has neuropathy affecting bilat LE and RUE; she describes sensation as numbness  COORDINATION: WFL BLE and BUE with rapid alt movement, chin<>target; shin>contrlat LE   EDEMA:  She reports every now and then she gets some swelling in BLE has been ongoing 4 years /not new   POSTURE: rounded shoulders, increased thoracic kyphosis    LOWER EXTREMITY MMT:   Grossly 4/5 bilat LE most deficits found proximal musculature   BED MOBILITY:  Rolling makes pt lightheaded   TRANSFERS: Able to complete hands-free in session with SBA  RAMP:  impaired confidence in balance on ramps per ABC questionnaire    STAIRS:  impaired confidence in balance on stairs  per ABC questionnaire   GAIT: See below: decreased gait speed, uses SPC, close CGA without AD, shuffling-like gait/decreased step length and decreased heel strike, limited arm-swing and hip extension bilat, L lateral sway in mid-stance  FUNCTIONAL TESTS:  5 times sit to stand: 13 sec, hands-free  Timed up and go (TUG): 19,5 sec no AD; 14 sec with SPC  10 meter walk test: 0.61 m/s with [facility] Berg Balance Scale: deferred    PATIENT SURVEYS:  ABC scale 58.75%  Vestibular Assessment from 02/22/2024:  OCULOMOTOR EXAM:  Ocular Alignment: normal, some skin drooping around R eye impacting alignment slightly, but relatively symmetrical  Ocular ROM: No Limitations  Spontaneous Nystagmus: absent  Gaze-Induced Nystagmus: absent  Smooth Pursuits: intact  Saccades: slow  Convergence/Divergence: appears normal  Cover/Cross-Cover: WNL  VESTIBULAR - OCULAR REFLEX:   Slow VOR: Normal  VOR Cancellation: Normal, when having pt perform it herself she moved her head more quickly and she said she felt a little lightheadedness  Head-Impulse Test: unable to test due to pt guarding  Dynamic Visual Acuity: to be tested if needed  POSITIONAL TESTING: Right Dix-Hallpike: upbeating, right nystagmus and Duration: <30 seconds Right Sidelying: symptomatic upon return to sitting upright Left Sidelying: no nystagmus and no symptoms  MOTION SENSITIVITY: to be tested if needed  Motion Sensitivity Quotient                                                                                                                              TREATMENT DATE: 06/20/2024  Pt ambulates into therapy clinic using hurricane with short, shuffled gait, and decreased gait speed - noticed L LE step length is shorter than R LE. Supervision provided for patient safety.  Unless otherwise stated, CGA was provided and gait belt donned in order to ensure pt safety throughout session.  Therapy session focused on dynamic gait training  outside over uneven terrains and gait endurance based on patient reporting multiple stumbles outside this past weekend. Gait training 5min30sec - 67min30sec bouts x4 reps (seated break between) to outside and  up/down outside, paved ramp focusing on dynamic gait - using SPC in L UE, but pt intermittently attempting to hold it up during gait for increased challenge Pt continues to demo short, shuffled steps with L LE being more impaired than R LE and overall significantly decreased gait speed Cuing for pt to image stepping over agility ladder reigns to increase step length with ability to improve for ~5-10 steps, but then reverts back to short, shuffled steps, especially when navigating uneven terrain due to increased fear of falling Pt with increased instability navigating downhill incline compared to uphill incline Pt reaching out for intermittent R HHA in addition to support from her cane during downhill  Gait endurance deficits with pt requiring seated rest breaks as noted above CGA for steadying throughout Pt with downward gaze and inability to visually scan environment during outside gait   Dynamic balance and B LE NMR via: Forward/backwards stepping over 1/2 foam roll x10 reps Requires R HHA providing light min A for stability   Gait training ~28ft out of clinic, no UE support, with CGA for safety and cuing to focus on increased step lengths with pt demonstrating improvement; however, when pt improves steps it causes fatigue quicker with gait.   PATIENT EDUCATION: Education details: assessment findings, plan, goals Person educated: Patient and pt's friend present for eval  Education method: Explanation Education comprehension: verbalized understanding  HOME EXERCISE PROGRAM:  Access Code: [email or web address] URL: [email or web address] Date: 03/28/2024 Prepared by: Connell Kiss  Exercises - Sit to Stand with Counter Support  - 1 x daily - 7 x weekly - 2 sets - 10 reps - Side  Stepping with Counter Support  - 1 x daily - 7 x weekly - 3 sets - 10 reps - Lunge with Counter Support  - 1 x daily - 3-5 x weekly - 2 sets - 10 reps - Narrow Stance with Unilateral Counter Support  - 1 x daily - 7 x weekly - 2 sets - 30 seconds hold - Standing Romberg to 1/2 Tandem Stance  - 1 x daily - 7 x weekly - 2 sets - 30 seconds hold   GOALS: Goals reviewed with patient? Yes  SHORT TERM GOALS: Target date: 06/20/2024  Patient will be independent in home exercise program to improve strength/mobility for better functional independence with ADLs. Baseline: initiated on 03/07/2024, updated on 03/28/2024 Goal status: INITIAL   LONG TERM GOALS: Target date: 08/01/2024   Patient will increase ABC scale score >80% to demonstrate better functional mobility and better confidence with ADLs.  Baseline: 58.75% 05/09/2024: 68.125% Goal status: IN PROGRESS  2.  Patient will complete five times sit to stand test in < 10 seconds indicating an increased LE strength and improved balance. Baseline: 13 sec 05/09/2024: 12.08 seconds with arms across chest Goal status: IN PROGRESS  3.  Patient will increase Berg Balance score by > 6 points to demonstrate decreased fall risk during functional activities Baseline: 02/29/2024: 36/56 05/09/2024: 36/56  Goal status: IN PROGRESS  4.  Patient will increase 10 meter walk test to >1.47m/s as to improve gait speed for better community ambulation and to reduce fall risk. Baseline: 0.61 m/s with [facility] 05/09/2024: Average Normal speed: 0.76 m/s & Average Fast speed: 0.89 m/s using hurricane 05/23/2024: Average Normal speed: 0.9165 m/s & Average Fast speed: 0.90 m/s using hurricane with SBA   Goal status: IN PROGRESS  5.  Patient will reduce timed up and go to <11 seconds to reduce fall risk and demonstrate improved  transfer/gait ability. Baseline: 19.5 sec no AD, 14 sec with SPC 05/09/2024: 16.36 seconds using hurricane with SBA 05/23/2024:  Goal status: IN  PROGRESS    ASSESSMENT:  CLINICAL IMPRESSION:   Patient is a pleasant 83 y.o. female who was seen today for physical therapy treatment for imbalance and neuropathy. Therapy session focused on dynamic gait training outside on paved ramp due to patient reporting having increased balance instability while ambulating outside this past weekend. Patient continues to demo short, shuffled steps with temporary ability to improve with cuing; however, it results in increased fatigue when she tries to sustain longer step lengths for a longer duration. Patient also demos impaired gait endurance requiring a seated rest break after ~26minutes of gait. Patient will continue to benefit from higher intensity gait training with focus on improving gait speed and balance during gait to decrease fall risk. Ms. Griffo will benefit from further skilled PT to improve impairments in order to decrease fall risk, and increase QOL and ease and safety with ADLs.    OBJECTIVE IMPAIRMENTS: Abnormal gait, decreased activity tolerance, decreased balance, decreased mobility, difficulty walking, decreased ROM, decreased strength, dizziness, impaired sensation, impaired UE functional use, improper body mechanics, postural dysfunction, and pain.   ACTIVITY LIMITATIONS: carrying, lifting, bending, standing, squatting, stairs, transfers, bed mobility, bathing, and locomotion level  PARTICIPATION LIMITATIONS: meal prep, cleaning, shopping, community activity, occupation, and yard work  PERSONAL FACTORS: Age, Fitness, Sex, Time since onset of injury/illness/exacerbation, and 3+ comorbidities: Per chart PMH significant for breast CA (R), hx hip fx, hx alcoholism, depression, B12 deficiency, Vitamin D  deficiency, osteoporosis without current pathological fx, mass of upper quadrant L breast, closed comminuted intertrochanteric fx of proximal femur, atherosclerosis of aorta are also affecting patient's functional outcome.   REHAB POTENTIAL:  Good  CLINICAL DECISION MAKING: Evolving/moderate complexity  EVALUATION COMPLEXITY: Moderate  PLAN:  PT FREQUENCY: 1-2x/week  PT DURATION: 12 weeks  PLANNED INTERVENTIONS: [id number]- PT Re-evaluation, 97750- Physical Performance Testing, 97110-Therapeutic exercises, 97530- Therapeutic activity, [id number]- Neuromuscular re-education, 97535- Self Care, [id number]- Manual therapy, [id number]- Gait training, [phone]- Orthotic Initial, [phone]- Orthotic/Prosthetic subsequent, [phone]- Canalith repositioning, [phone]- Splinting, [phone]- Electrical stimulation (manual), Patient/Family education, Balance training, Stair training, Taping, Joint mobilization, Spinal mobilization, Vestibular training, DME instructions, Wheelchair mobility training, Cryotherapy, and Moist heat    PLAN FOR NEXT SESSION:  - review HEP - standing balance:  - reaching activities   - turning 360 degrees!  - alternating foot taps   - progression to semi-tandem  - fwd/back & side stepping over obstacles - dynamic gait training  - need to include turning more   - figure 8 gait?  - continue use of agility ladder  - focus on heel strike  - include scanning of environment     Cheney Gosch, PT, DPT, NCS, CSRS Physical Therapist - [facility]  [facility]  8:51 AM 06/20/24

## 2024-07-04 ENCOUNTER — Ambulatory Visit: Attending: Neurology | Admitting: Physical Therapy

## 2024-07-04 DIAGNOSIS — R262 Difficulty in walking, not elsewhere classified: Secondary | ICD-10-CM | POA: Insufficient documentation

## 2024-07-04 DIAGNOSIS — R42 Dizziness and giddiness: Secondary | ICD-10-CM | POA: Diagnosis not present

## 2024-07-04 DIAGNOSIS — R2681 Unsteadiness on feet: Secondary | ICD-10-CM | POA: Diagnosis not present

## 2024-07-04 DIAGNOSIS — M6281 Muscle weakness (generalized): Secondary | ICD-10-CM | POA: Diagnosis not present

## 2024-07-04 NOTE — Therapy (Signed)
 OUTPATIENT PHYSICAL THERAPY NEURO TREATMENT   Patient Name: Rebecca Faulkner MRN: [id number] DOB:September 11, 1941, 83 y.o., female Today's Date: 07/04/2024   PCP: Bernardo Fend, DO REFERRING PROVIDER: Lane Arthea BRAVO, MD  END OF SESSION:   PT End of Session - 07/04/24 0810     Visit Number 14    Number of Visits 49    Date for PT Re-Evaluation 08/01/24    PT Start Time 0807    PT Stop Time 0848    PT Time Calculation (min) 41 min    Equipment Utilized During Treatment Gait belt    Activity Tolerance Patient tolerated treatment well    Behavior During Therapy [facility] for tasks assessed/performed              Past Medical History:  Diagnosis Date   Breast cancer (HCC) 2013   right breast   Heart murmur 1990   Hypercholesteremia    Lump or mass in breast    Malignant neoplasm of upper-outer quadrant of female breast (HCC) 2013   R-breast T1, NO, MO, ER/PR pos., Her 2 neg.   Osteoporosis    Ulcer 1974   Past Surgical History:  Procedure Laterality Date   BREAST SURGERY Right 2013   CATARACT EXTRACTION W/PHACO Right 04/23/2016   Procedure: CATARACT EXTRACTION PHACO AND INTRAOCULAR LENS PLACEMENT (IOC) RIGHT EYE;  Surgeon: Dene Etienne, MD;  Location: [facility];  Service: Ophthalmology;  Laterality: Right;   CATARACT EXTRACTION W/PHACO Left 06/04/2016   Procedure: CATARACT EXTRACTION PHACO AND INTRAOCULAR LENS PLACEMENT (IOC);  Surgeon: Dene Etienne, MD;  Location: [facility];  Service: Ophthalmology;  Laterality: Left;   CESAREAN SECTION  1969   COLONOSCOPY WITH PROPOFOL  N/A 07/22/2017   Procedure: COLONOSCOPY WITH PROPOFOL ;  Surgeon: Dessa Reyes ORN, MD;  Location: [facility];  Service: Endoscopy;  Laterality: N/A;   FRACTURE SURGERY Left 2011   arm, plate and 12 screws   LEG SURGERY Right 01/02/2015   MASTECTOMY Right 2013   Right inguinal hernia repair  2013   VEIN SURGERY Left 2015   Patient Active Problem List   Diagnosis  Date Noted   History of diverticulitis 03/12/2020   Left ovarian cyst 03/12/2020   Atherosclerosis of aorta (HCC) 03/12/2020   Osteoporosis 08/05/2019   Closed comminuted intertrochanteric fracture of proximal femur (HCC) 07/27/2018   Mass of upper outer quadrant of left breast 06/01/2018   Goals of care, counseling/discussion 01/31/2018   Osteoporosis without current pathological fracture 07/28/2017   Depression, major, recurrent, mild (HCC) 07/07/2017   B12 deficiency 07/07/2017   Vitamin D  deficiency 07/07/2017   Screening for colon cancer 05/27/2017   Malignant neoplasm of upper-outer quadrant of right breast in female, estrogen receptor positive (HCC) 05/27/2017   Glaucoma 05/26/2017   History of alcoholism (HCC) 05/26/2017   History of hip fracture 05/10/2015   Breast cancer, right (HCC) 05/27/2012   Estrogen receptor positive status (ER+) 05/14/2012    ONSET DATE: 5 years ago  REFERRING DIAG:  R26.89 (ICD-10-CM) - Imbalance  G62.9 (ICD-10-CM) - Neuropathy    THERAPY DIAG:   Dizziness and giddiness  Unsteadiness on feet  Muscle weakness (generalized)  Difficulty in walking, not elsewhere classified  Rationale for Evaluation and Treatment: Rehabilitation  SUBJECTIVE:  SUBJECTIVE STATEMENT:  Pt reports she has been OK. Pt states her balance has been OK. Pt states she has been feeling lightheaded recently. Denies feelings of movement when lying down. Pt feels lightheadedness is associated with sinuses and the changing of the weather. Pt states she has continuously been drinking water all day long at work. Pt states she wasn't feeling very good this morning so she took an Advil. No reports of falls. No other updates.   Initial Eval: Pt is a pleasant 83 y/o female presenting to  PT for imbalance. Pt ambulating with a SPC. She states she initially thought she had vertigo when she started losing her balance where onset of imbalance was about 5 years ago. Pt noticed imbalance with carrying tea while ambulating; she felt she was going to fall/had someone help her. Pt reports she still gets dizzy off and on. She describes it as weak and sometimes the room moves. When she had an MRI recently she saw the room spin. Pt reports no falls in the last six months, but she has had near-falls where she grabs onto stuff.  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 She has neuropathy affecting bilat LE.  Pt accompanied by: friend Counsellor)  PERTINENT HISTORY: Per chart PMH significant for breast CA (R), hx hip fx, hx alcoholism, depression, B12 deficiency, Vitamin D  deficiency, osteoporosis without current pathological fx, mass of upper quadrant L breast, closed comminuted intertrochanteric fx of proximal femur, atherosclerosis of aorta, new diagnosis of normal pressure hydrocephalus  Neurology MD apt from 03/14/2024: Reviewed results of recent Brain MRI from 01/18/2024 in the office today. Results indicated mild ventriculomegaly with narrowing of the callosal angle. Symptoms indeterminate between normal pressure hydrocephalus and age-related changes at this time. Will continue to monitor at future visits.   Pt states she doesn't want to have a shunt put into her brain.  PAIN:  Are you having pain? General arthritis pain; she reports she takes Advil to help her pain  PRECAUTIONS: Fall, normal pressure hydrocephalus  RED FLAGS: Pt states she wears a diaper due to incontinence and says physician aware   WEIGHT BEARING RESTRICTIONS: No  FALLS: Has patient fallen in last 6 months? No  LIVING ENVIRONMENT: Lives with: lives alone Lives in: House/apartment Stairs: 7 steps to get into home, bilat rails Has following  equipment at home: Single point cane, Grab bars, and grab bar in tub but does not use anymore  PLOF: indep with ADLs at home but is getting some help at work  PATIENT GOALS: she wants to walk without a cane; I don't want to end up in a wheelchair   OBJECTIVE:  Note: Objective measures were completed at Evaluation unless otherwise noted.  DIAGNOSTIC FINDINGS:  EXAM 01/18/2024: MRI HEAD WITHOUT CONTRAST IMPRESSION: 1. Mild ventriculomegaly with narrowing of the callosal angle. Findings can be seen with normal pressure hydrocephalus in the appropriate clinical setting. 2. Chronic small vessel changes. 3. No acute infarct.     Electronically Signed   By: Clem Savory M.D.   On: 02/10/2024 11:19  CT HEAD 10/20/22: FINDINGS: Brain: No acute infarct or hemorrhage. Lateral ventricles and midline structures are stable. No acute extra-axial fluid collections. No mass effect.   Vascular: No hyperdense vessel or unexpected calcification.   Skull: Normal. Negative for fracture or focal lesion.   Sinuses/Orbits: Mild mucoperiosteal thickening throughout the ethmoid air cells. Remaining  paranasal sinuses are clear.   Other: None.   IMPRESSION: 1. Ethmoid sinus disease. 2. No acute intracranial process.     Electronically Signed   By: Ozell Daring M.D.   On: 10/20/2022 16:14  COGNITION: Overall cognitive status: Within functional limits for tasks assessed   SENSATION: Pt has neuropathy affecting bilat LE and RUE; she describes sensation as numbness  COORDINATION: WFL BLE and BUE with rapid alt movement, chin<>target; shin>contrlat LE   EDEMA:  She reports every now and then she gets some swelling in BLE has been ongoing 4 years /not new   POSTURE: rounded shoulders, increased thoracic kyphosis    LOWER EXTREMITY MMT:   Grossly 4/5 bilat LE most deficits found proximal musculature   BED MOBILITY:  Rolling makes pt lightheaded   TRANSFERS: Able to complete  hands-free in session with SBA  RAMP:  impaired confidence in balance on ramps per ABC questionnaire    STAIRS:  impaired confidence in balance on stairs per ABC questionnaire   GAIT: See below: decreased gait speed, uses SPC, close CGA without AD, shuffling-like gait/decreased step length and decreased heel strike, limited arm-swing and hip extension bilat, L lateral sway in mid-stance  FUNCTIONAL TESTS:  5 times sit to stand: 13 sec, hands-free  Timed up and go (TUG): 19,5 sec no AD; 14 sec with SPC  10 meter walk test: 0.61 m/s with [facility] Berg Balance Scale: deferred    PATIENT SURVEYS:  ABC scale 58.75%  Vestibular Assessment from 02/22/2024:  OCULOMOTOR EXAM:  Ocular Alignment: normal, some skin drooping around R eye impacting alignment slightly, but relatively symmetrical  Ocular ROM: No Limitations  Spontaneous Nystagmus: absent  Gaze-Induced Nystagmus: absent  Smooth Pursuits: intact  Saccades: slow  Convergence/Divergence: appears normal  Cover/Cross-Cover: WNL  VESTIBULAR - OCULAR REFLEX:   Slow VOR: Normal  VOR Cancellation: Normal, when having pt perform it herself she moved her head more quickly and she said she felt a little lightheadedness  Head-Impulse Test: unable to test due to pt guarding  Dynamic Visual Acuity: to be tested if needed  POSITIONAL TESTING: Right Dix-Hallpike: upbeating, right nystagmus and Duration: <30 seconds Right Sidelying: symptomatic upon return to sitting upright Left Sidelying: no nystagmus and no symptoms  MOTION SENSITIVITY: to be tested if needed  Motion Sensitivity Quotient                                                                                                                              TREATMENT DATE: 07/04/2024  Pt ambulates into therapy clinic using hurricane with short, shuffled gait, and decreased gait speed - noticed L LE step length is shorter than R LE. Supervision provided for patient  safety.  Unless otherwise stated, CGA was provided and gait belt donned in order to ensure pt safety throughout session.  Vitals to start session, in sitting: BP 124/85 (MAP 97), HR 85bpm, SpO2 99% Vitals in standing: BP 137/88 (MAP 104),  HR 90bpm, SpO2 97%, denies lightheadedness or other symptoms  Goal of therapy session to work at increased intensity with more active rest breaks.  Gait training ~230ft, no AD, with CGA for steadying. Pt demonstrating the following gait deviations with therapist providing the described cuing and facilitation for improvement:  Continues to have short, shuffled step lengths with guarded upper body posturing and decreased arm swing Cuing to imagine stepping over ladder reigns to promote increased step length and longer stance time  Repeated sit<>stands x10reps without UE support   Dynamic gait training including:  Circuit combination of:  Figure 8 gait around 2 cones  Progressed to intermittently having pt look up to identify number of fingers held up by therapist to promote visual scanning and head turns  Forward reciprocal stepping through agility ladder  Able to do without UE support! CGA/light min A for steadying  12 stair navigation using B UE support on B HRs with cuing for reciprocal pattern to promote B LE functional strengthening during longer swing phase advancement CGA/light min A  Repeated sit<>stands x10reps without UE support  Max cuing to hinge forward at her hips when going to stand and when returning to sit in order to decrease posterior lean/LOB bias   Dynamic gait training with the following 2 as circuit style, wearing 2.5lb AWs on B LEs:  Alternating foot taps to tall 12in height step Requires R HHA providing min A for balance With fatigue, has difficulty clearing foot on step Cuing to tap heel on step rather than toes to promote improved motor plan Forward reciprocal gait through agility ladder Pt with increased fatigue and  greater challenge wearing AWs, requiring more consistent light min A for balance    Gait training ~59ft out of clinic, no UE support, with CGA for safety and cuing to focus on increased step lengths with pt demonstrating improvement; however,  pt with difficulty sustaining it over longer period of time due to fatigue.    PATIENT EDUCATION: Education details: assessment findings, plan, goals Person educated: Patient and pt's friend present for eval  Education method: Explanation Education comprehension: verbalized understanding  HOME EXERCISE PROGRAM:  Access Code: [email or web address] URL: [email or web address] Date: 03/28/2024 Prepared by: Connell Kiss  Exercises - Sit to Stand with Counter Support  - 1 x daily - 7 x weekly - 2 sets - 10 reps - Side Stepping with Counter Support  - 1 x daily - 7 x weekly - 3 sets - 10 reps - Lunge with Counter Support  - 1 x daily - 3-5 x weekly - 2 sets - 10 reps - Narrow Stance with Unilateral Counter Support  - 1 x daily - 7 x weekly - 2 sets - 30 seconds hold - Standing Romberg to 1/2 Tandem Stance  - 1 x daily - 7 x weekly - 2 sets - 30 seconds hold   GOALS: Goals reviewed with patient? Yes  SHORT TERM GOALS: Target date: 06/20/2024  Patient will be independent in home exercise program to improve strength/mobility for better functional independence with ADLs. Baseline: initiated on 03/07/2024, updated on 03/28/2024 Goal status: INITIAL   LONG TERM GOALS: Target date: 08/01/2024   Patient will increase ABC scale score >80% to demonstrate better functional mobility and better confidence with ADLs.  Baseline: 58.75% 05/09/2024: 68.125% Goal status: IN PROGRESS  2.  Patient will complete five times sit to stand test in < 10 seconds indicating an increased LE strength and improved balance. Baseline: 13 sec 05/09/2024: 12.08 seconds  with arms across chest Goal status: IN PROGRESS  3.  Patient will increase Berg Balance score by > 6 points  to demonstrate decreased fall risk during functional activities Baseline: 02/29/2024: 36/56 05/09/2024: 36/56  Goal status: IN PROGRESS  4.  Patient will increase 10 meter walk test to >1.65m/s as to improve gait speed for better community ambulation and to reduce fall risk. Baseline: 0.61 m/s with [facility] 05/09/2024: Average Normal speed: 0.76 m/s & Average Fast speed: 0.89 m/s using hurricane 05/23/2024: Average Normal speed: 0.9165 m/s & Average Fast speed: 0.90 m/s using hurricane with SBA   Goal status: IN PROGRESS  5.  Patient will reduce timed up and go to <11 seconds to reduce fall risk and demonstrate improved transfer/gait ability. Baseline: 19.5 sec no AD, 14 sec with SPC 05/09/2024: 16.36 seconds using hurricane with SBA 05/23/2024:  Goal status: IN PROGRESS    ASSESSMENT:  CLINICAL IMPRESSION:   Patient is a pleasant 83 y.o. female who was seen today for physical therapy treatment for imbalance and neuropathy. Therapy session focused on dynamic gait training due to pt continuing to demo short, shuffled steps with only temporary ability to improve with cuing; however, it results in increased fatigue when she tries to sustain longer step lengths for a longer duration. Therapy session focused on decreased rest break time and participating in more active rest breaks to promote overall increased activity tolerance. Patient continues to respond well to use of agility ladder as external target cuing for longer step lengths and will benefit from similar circuit style gait training in future sessions to promote carryover of improved gait mechanics. Patient will continue to benefit from higher intensity gait training with focus on improving gait speed and balance during gait to decrease fall risk. Ms. Hubka will benefit from further skilled PT to improve impairments in order to decrease fall risk, and increase QOL and ease and safety with ADLs.    OBJECTIVE IMPAIRMENTS: Abnormal gait, decreased  activity tolerance, decreased balance, decreased mobility, difficulty walking, decreased ROM, decreased strength, dizziness, impaired sensation, impaired UE functional use, improper body mechanics, postural dysfunction, and pain.   ACTIVITY LIMITATIONS: carrying, lifting, bending, standing, squatting, stairs, transfers, bed mobility, bathing, and locomotion level  PARTICIPATION LIMITATIONS: meal prep, cleaning, shopping, community activity, occupation, and yard work  PERSONAL FACTORS: Age, Fitness, Sex, Time since onset of injury/illness/exacerbation, and 3+ comorbidities: Per chart PMH significant for breast CA (R), hx hip fx, hx alcoholism, depression, B12 deficiency, Vitamin D  deficiency, osteoporosis without current pathological fx, mass of upper quadrant L breast, closed comminuted intertrochanteric fx of proximal femur, atherosclerosis of aorta are also affecting patient's functional outcome.   REHAB POTENTIAL: Good  CLINICAL DECISION MAKING: Evolving/moderate complexity  EVALUATION COMPLEXITY: Moderate  PLAN:  PT FREQUENCY: 1-2x/week  PT DURATION: 12 weeks  PLANNED INTERVENTIONS: [id number]- PT Re-evaluation, 97750- Physical Performance Testing, 97110-Therapeutic exercises, 97530- Therapeutic activity, [id number]- Neuromuscular re-education, 97535- Self Care, [id number]- Manual therapy, [id number]- Gait training, [id number]- Orthotic Initial, [phone]- Orthotic/Prosthetic subsequent, [phone]- Canalith repositioning, [phone]- Splinting, [phone]- Electrical stimulation (manual), Patient/Family education, Balance training, Stair training, Taping, Joint mobilization, Spinal mobilization, Vestibular training, DME instructions, Wheelchair mobility training, Cryotherapy, and Moist heat    PLAN FOR NEXT SESSION:  - review HEP - standing balance:  - reaching activities   - turning 360 degrees!  - alternating foot taps   - progression to semi-tandem  - fwd/back & side stepping over obstacles - dynamic gait training  -  need to include turning more   -  continue figure 8 gait  - continue use of agility ladder  - focus on heel strike  - include scanning of environment     Rebecca Faulkner, PT, DPT, NCS, CSRS Physical Therapist - [facility]  [facility]  8:49 AM 07/04/24

## 2024-07-11 ENCOUNTER — Ambulatory Visit: Admitting: Physical Therapy

## 2024-07-11 DIAGNOSIS — R262 Difficulty in walking, not elsewhere classified: Secondary | ICD-10-CM

## 2024-07-11 DIAGNOSIS — R2681 Unsteadiness on feet: Secondary | ICD-10-CM

## 2024-07-11 DIAGNOSIS — M6281 Muscle weakness (generalized): Secondary | ICD-10-CM

## 2024-07-11 DIAGNOSIS — R42 Dizziness and giddiness: Secondary | ICD-10-CM | POA: Diagnosis not present

## 2024-07-11 NOTE — Therapy (Signed)
 OUTPATIENT PHYSICAL THERAPY NEURO TREATMENT   Patient Name: GWENDELYN LANTING MRN: [id number] DOB:June 19, 1941, 83 y.o., female Today's Date: 07/11/2024   PCP: Bernardo Fend, DO REFERRING PROVIDER: Lane Arthea BRAVO, MD  END OF SESSION:   PT End of Session - 07/11/24 0812     Visit Number 15    Number of Visits 49    Date for PT Re-Evaluation 08/01/24    PT Start Time 0811    PT Stop Time 0845    PT Time Calculation (min) 34 min    Equipment Utilized During Treatment Gait belt    Activity Tolerance Patient tolerated treatment well    Behavior During Therapy [facility] for tasks assessed/performed           Past Medical History:  Diagnosis Date   Breast cancer (HCC) 2013   right breast   Heart murmur 1990   Hypercholesteremia    Lump or mass in breast    Malignant neoplasm of upper-outer quadrant of female breast (HCC) 2013   R-breast T1, NO, MO, ER/PR pos., Her 2 neg.   Osteoporosis    Ulcer 1974   Past Surgical History:  Procedure Laterality Date   BREAST SURGERY Right 2013   CATARACT EXTRACTION W/PHACO Right 04/23/2016   Procedure: CATARACT EXTRACTION PHACO AND INTRAOCULAR LENS PLACEMENT (IOC) RIGHT EYE;  Surgeon: Dene Etienne, MD;  Location: [facility];  Service: Ophthalmology;  Laterality: Right;   CATARACT EXTRACTION W/PHACO Left 06/04/2016   Procedure: CATARACT EXTRACTION PHACO AND INTRAOCULAR LENS PLACEMENT (IOC);  Surgeon: Dene Etienne, MD;  Location: [facility];  Service: Ophthalmology;  Laterality: Left;   CESAREAN SECTION  1969   COLONOSCOPY WITH PROPOFOL  N/A 07/22/2017   Procedure: COLONOSCOPY WITH PROPOFOL ;  Surgeon: Dessa Reyes ORN, MD;  Location: [facility];  Service: Endoscopy;  Laterality: N/A;   FRACTURE SURGERY Left 2011   arm, plate and 12 screws   LEG SURGERY Right 01/02/2015   MASTECTOMY Right 2013   Right inguinal hernia repair  2013   VEIN SURGERY Left 2015   Patient Active Problem List   Diagnosis Date  Noted   History of diverticulitis 03/12/2020   Left ovarian cyst 03/12/2020   Atherosclerosis of aorta (HCC) 03/12/2020   Osteoporosis 08/05/2019   Closed comminuted intertrochanteric fracture of proximal femur (HCC) 07/27/2018   Mass of upper outer quadrant of left breast 06/01/2018   Goals of care, counseling/discussion 01/31/2018   Osteoporosis without current pathological fracture 07/28/2017   Depression, major, recurrent, mild (HCC) 07/07/2017   B12 deficiency 07/07/2017   Vitamin D  deficiency 07/07/2017   Screening for colon cancer 05/27/2017   Malignant neoplasm of upper-outer quadrant of right breast in female, estrogen receptor positive (HCC) 05/27/2017   Glaucoma 05/26/2017   History of alcoholism (HCC) 05/26/2017   History of hip fracture 05/10/2015   Breast cancer, right (HCC) 05/27/2012   Estrogen receptor positive status (ER+) 05/14/2012    ONSET DATE: 5 years ago  REFERRING DIAG:  R26.89 (ICD-10-CM) - Imbalance  G62.9 (ICD-10-CM) - Neuropathy    THERAPY DIAG:   Dizziness and giddiness  Unsteadiness on feet  Muscle weakness (generalized)  Difficulty in walking, not elsewhere classified  Rationale for Evaluation and Treatment: Rehabilitation  SUBJECTIVE:  SUBJECTIVE STATEMENT:  Pt apologizes for being late due to her ride. Pt sounds a little congested, but states she is doing OK. Pt states approximately 1 month ago she had a day at work where she was walking to the bathroom when suddenly everything went off, like you flipped a switch and then flipped it back on again. Pt states this past Saturday she had another episode like that when she went to get up and go to the bathroom. Pt states both times this happened it was on a Saturday at work when they were very busy.  Pt  sates she is also really worried about her friend, Stephane, who usually comes to therapy appointments with her b/c she has been ill. Denies any falls. No other updates.    Initial Eval: Pt is a pleasant 83 y/o female presenting to PT for imbalance. Pt ambulating with a SPC. She states she initially thought she had vertigo when she started losing her balance where onset of imbalance was about 5 years ago. Pt noticed imbalance with carrying tea while ambulating; she felt she was going to fall/had someone help her. Pt reports she still gets dizzy off and on. She describes it as weak and sometimes the room moves. When she had an MRI recently she saw the room spin. Pt reports no falls in the last six months, but she has had near-falls where she grabs onto stuff.  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 She has neuropathy affecting bilat LE.  Pt accompanied by: friend Counsellor)  PERTINENT HISTORY: Per chart PMH significant for breast CA (R), hx hip fx, hx alcoholism, depression, B12 deficiency, Vitamin D  deficiency, osteoporosis without current pathological fx, mass of upper quadrant L breast, closed comminuted intertrochanteric fx of proximal femur, atherosclerosis of aorta, new diagnosis of normal pressure hydrocephalus  Neurology MD apt from 03/14/2024: Reviewed results of recent Brain MRI from 01/18/2024 in the office today. Results indicated mild ventriculomegaly with narrowing of the callosal angle. Symptoms indeterminate between normal pressure hydrocephalus and age-related changes at this time. Will continue to monitor at future visits.   Pt states she doesn't want to have a shunt put into her brain.  PAIN:  Are you having pain? General arthritis pain; she reports she takes Advil to help her pain  PRECAUTIONS: Fall, normal pressure hydrocephalus  RED FLAGS: Pt states she wears a diaper due to incontinence and says physician  aware   WEIGHT BEARING RESTRICTIONS: No  FALLS: Has patient fallen in last 6 months? No  LIVING ENVIRONMENT: Lives with: lives alone Lives in: House/apartment Stairs: 7 steps to get into home, bilat rails Has following equipment at home: Single point cane, Grab bars, and grab bar in tub but does not use anymore  PLOF: indep with ADLs at home but is getting some help at work  PATIENT GOALS: she wants to walk without a cane; I don't want to end up in a wheelchair   OBJECTIVE:  Note: Objective measures were completed at Evaluation unless otherwise noted.  DIAGNOSTIC FINDINGS:  EXAM 01/18/2024: MRI HEAD WITHOUT CONTRAST IMPRESSION: 1. Mild ventriculomegaly with narrowing of the callosal angle. Findings can be seen with normal pressure hydrocephalus in the appropriate clinical setting. 2. Chronic small vessel changes. 3. No acute infarct.     Electronically Signed   By: Clem Savory M.D.   On: 02/10/2024 11:19  CT HEAD 10/20/22:  FINDINGS: Brain: No acute infarct or hemorrhage. Lateral ventricles and midline structures are stable. No acute extra-axial fluid collections. No mass effect.   Vascular: No hyperdense vessel or unexpected calcification.   Skull: Normal. Negative for fracture or focal lesion.   Sinuses/Orbits: Mild mucoperiosteal thickening throughout the ethmoid air cells. Remaining paranasal sinuses are clear.   Other: None.   IMPRESSION: 1. Ethmoid sinus disease. 2. No acute intracranial process.     Electronically Signed   By: Ozell Daring M.D.   On: 10/20/2022 16:14  COGNITION: Overall cognitive status: Within functional limits for tasks assessed   SENSATION: Pt has neuropathy affecting bilat LE and RUE; she describes sensation as numbness  COORDINATION: WFL BLE and BUE with rapid alt movement, chin<>target; shin>contrlat LE   EDEMA:  She reports every now and then she gets some swelling in BLE has been ongoing 4 years /not  new   POSTURE: rounded shoulders, increased thoracic kyphosis    LOWER EXTREMITY MMT:   Grossly 4/5 bilat LE most deficits found proximal musculature   BED MOBILITY:  Rolling makes pt lightheaded   TRANSFERS: Able to complete hands-free in session with SBA  RAMP:  impaired confidence in balance on ramps per ABC questionnaire    STAIRS:  impaired confidence in balance on stairs per ABC questionnaire   GAIT: See below: decreased gait speed, uses SPC, close CGA without AD, shuffling-like gait/decreased step length and decreased heel strike, limited arm-swing and hip extension bilat, L lateral sway in mid-stance  FUNCTIONAL TESTS:  5 times sit to stand: 13 sec, hands-free  Timed up and go (TUG): 19,5 sec no AD; 14 sec with SPC  10 meter walk test: 0.61 m/s with [facility] Berg Balance Scale: deferred    PATIENT SURVEYS:  ABC scale 58.75%  Vestibular Assessment from 02/22/2024:  OCULOMOTOR EXAM:  Ocular Alignment: normal, some skin drooping around R eye impacting alignment slightly, but relatively symmetrical  Ocular ROM: No Limitations  Spontaneous Nystagmus: absent  Gaze-Induced Nystagmus: absent  Smooth Pursuits: intact  Saccades: slow  Convergence/Divergence: appears normal  Cover/Cross-Cover: WNL  VESTIBULAR - OCULAR REFLEX:   Slow VOR: Normal  VOR Cancellation: Normal, when having pt perform it herself she moved her head more quickly and she said she felt a little lightheadedness  Head-Impulse Test: unable to test due to pt guarding  Dynamic Visual Acuity: to be tested if needed  POSITIONAL TESTING: Right Dix-Hallpike: upbeating, right nystagmus and Duration: <30 seconds Right Sidelying: symptomatic upon return to sitting upright Left Sidelying: no nystagmus and no symptoms  MOTION SENSITIVITY: to be tested if needed  Motion Sensitivity Quotient                                                                                                                               TREATMENT DATE: 07/11/2024  Pt ambulates into therapy clinic using hurricane with short, shuffled gait, and decreased gait speed - noticed L LE  step length is shorter than R LE. Supervision provided for patient safety.  Unless otherwise stated, CGA was provided and gait belt donned in order to ensure pt safety throughout session.  Therapist educated pt on recommendation to ensure she is drinking enough water on those busy days at work because it sounds like she may be experiencing orthostatic hypotension given it occurs when she goes to get up and go to the bathroom.  Goal of therapy session continued to be work at increased intensity with more active rest breaks.  Pt with increased nervousness to start session, likely due to being upset from being late to appointment.  Gait training ~296ft, no AD, with CGA for steadying. Pt demonstrating the following gait deviations with therapist providing the described cuing and facilitation for improvement:  Continues to have short, shuffled step lengths with guarded upper body posturing and decreased arm swing Cuing to imagine stepping over ladder reigns to promote increased step length and longer stance time Cuing to relax upper body posturing for more natural arm swing  Repeated sit<>stands x10reps without UE support - added 5lb dumbbell at chest and repeated x10reps  Min cuing to hinge forward at her hips when going to stand and when returning to sit in order to decrease posterior lean/LOB bias  Dynamic gait training including:  Circuit combination of:  Figure 8 gait around 2 cones  Having pt look up to identify cards held up by +2 A to promote visual scanning and head turns  Forward reciprocal stepping through agility ladder  Requires R UE support today due to patient not feeling well - this provides min A for balance stability  Repeated sit<>stands x10reps using 5lb dumbbell Cuing to sit towards edge of chair, which helps promote  patient having increased hip flexion when returning to sit, to decrease posterior LOB  Dynamic balance and B LE functional strengthening:  Forward stepping over 1/2 foam roll followed by mini-lunge 2x 8 reps per LE Using UE support on mat table CGA for safety *pt reports really liking this exercise - educated pt that this was on her HEP and another HEP printout provided as pt reports she could do this at work *pt states she feels her LEs are getting stronger  Dynamic balance:  Turning to tap hedgehogs behind patient on verbal command with dual-task cognitive challenge to remember where the different colored targets are located CGA for balance   Gait out of clinic using hurricane mod-I.   PATIENT EDUCATION: Education details: assessment findings, plan, goals Person educated: Patient and pt's friend present for eval  Education method: Explanation Education comprehension: verbalized understanding  HOME EXERCISE PROGRAM:  Access Code: [email or web address] URL: [email or web address] Date: 03/28/2024 Prepared by: Connell Kiss  Exercises - Sit to Stand with Counter Support  - 1 x daily - 7 x weekly - 2 sets - 10 reps - Side Stepping with Counter Support  - 1 x daily - 7 x weekly - 3 sets - 10 reps - Lunge with Counter Support  - 1 x daily - 3-5 x weekly - 2 sets - 10 reps - Narrow Stance with Unilateral Counter Support  - 1 x daily - 7 x weekly - 2 sets - 30 seconds hold - Standing Romberg to 1/2 Tandem Stance  - 1 x daily - 7 x weekly - 2 sets - 30 seconds hold   GOALS: Goals reviewed with patient? Yes  SHORT TERM GOALS: Target date: 06/20/2024  Patient will be independent in home exercise program  to improve strength/mobility for better functional independence with ADLs. Baseline: initiated on 03/07/2024, updated on 03/28/2024 Goal status: INITIAL   LONG TERM GOALS: Target date: 08/01/2024   Patient will increase ABC scale score >80% to demonstrate better functional mobility  and better confidence with ADLs.  Baseline: 58.75% 05/09/2024: 68.125% Goal status: IN PROGRESS  2.  Patient will complete five times sit to stand test in < 10 seconds indicating an increased LE strength and improved balance. Baseline: 13 sec 05/09/2024: 12.08 seconds with arms across chest Goal status: IN PROGRESS  3.  Patient will increase Berg Balance score by > 6 points to demonstrate decreased fall risk during functional activities Baseline: 02/29/2024: 36/56 05/09/2024: 36/56  Goal status: IN PROGRESS  4.  Patient will increase 10 meter walk test to >1.86m/s as to improve gait speed for better community ambulation and to reduce fall risk. Baseline: 0.61 m/s with [facility] 05/09/2024: Average Normal speed: 0.76 m/s & Average Fast speed: 0.89 m/s using hurricane 05/23/2024: Average Normal speed: 0.9165 m/s & Average Fast speed: 0.90 m/s using hurricane with SBA   Goal status: IN PROGRESS  5.  Patient will reduce timed up and go to <11 seconds to reduce fall risk and demonstrate improved transfer/gait ability. Baseline: 19.5 sec no AD, 14 sec with SPC 05/09/2024: 16.36 seconds using hurricane with SBA 05/23/2024:  Goal status: IN PROGRESS    ASSESSMENT:  CLINICAL IMPRESSION:   Patient is a pleasant 83 y.o. female who was seen today for physical therapy treatment for imbalance and neuropathy. Therapy session focused on dynamic gait training due to pt continuing to demo short, shuffled steps with only temporary ability to improve with cuing; however, it results in increased fatigue when she tries to sustain longer step lengths for a longer duration. Therapy session focused on decreased rest break time and participating in more active rest breaks to promote overall increased activity tolerance, with consideration of patient not feeling completely well today. Patient continues to respond well to use of agility ladder as external target cuing for longer step lengths and will benefit from similar  circuit style gait training in future sessions to promote carryover of improved gait mechanics. Patient will continue to benefit from higher intensity gait training with focus on improving gait speed and balance during gait to decrease fall risk. Patient also benefited from dynamic balance task of turning as pt continues to demonstrate imbalance and insecurity with turns. Ms. Furuta will benefit from further skilled PT to improve impairments in order to decrease fall risk, and increase QOL and ease and safety with ADLs.    OBJECTIVE IMPAIRMENTS: Abnormal gait, decreased activity tolerance, decreased balance, decreased mobility, difficulty walking, decreased ROM, decreased strength, dizziness, impaired sensation, impaired UE functional use, improper body mechanics, postural dysfunction, and pain.   ACTIVITY LIMITATIONS: carrying, lifting, bending, standing, squatting, stairs, transfers, bed mobility, bathing, and locomotion level  PARTICIPATION LIMITATIONS: meal prep, cleaning, shopping, community activity, occupation, and yard work  PERSONAL FACTORS: Age, Fitness, Sex, Time since onset of injury/illness/exacerbation, and 3+ comorbidities: Per chart PMH significant for breast CA (R), hx hip fx, hx alcoholism, depression, B12 deficiency, Vitamin D  deficiency, osteoporosis without current pathological fx, mass of upper quadrant L breast, closed comminuted intertrochanteric fx of proximal femur, atherosclerosis of aorta are also affecting patient's functional outcome.   REHAB POTENTIAL: Good  CLINICAL DECISION MAKING: Evolving/moderate complexity  EVALUATION COMPLEXITY: Moderate  PLAN:  PT FREQUENCY: 1-2x/week  PT DURATION: 12 weeks  PLANNED INTERVENTIONS: 97164- PT Re-evaluation, 97750-  Physical Performance Testing, 97110-Therapeutic exercises, 97530- Therapeutic activity, [id number]- Neuromuscular re-education, [phone]- Self Care, [id number]- Manual therapy, [id number]- Gait training, [phone]- Orthotic Initial,  [phone]- Orthotic/Prosthetic subsequent, [phone]- Canalith repositioning, [phone]- Splinting, [phone]- Electrical stimulation (manual), Patient/Family education, Balance training, Stair training, Taping, Joint mobilization, Spinal mobilization, Vestibular training, DME instructions, Wheelchair mobility training, Cryotherapy, and Moist heat    PLAN FOR NEXT SESSION:  - review HEP - standing balance:  - reaching activities   - turning 360 degrees!  - alternating foot taps   - progression to semi-tandem  - fwd/back & side stepping over obstacles - dynamic gait training  - try treadmill*  - need to include turning more   - continue figure 8 gait  - continue use of agility ladder  - focus on heel strike  - include scanning of environment     Burnell Hurta, PT, DPT, NCS, CSRS Physical Therapist - [facility]  [facility]  8:47 AM 07/11/24

## 2024-07-18 ENCOUNTER — Ambulatory Visit: Admitting: Physical Therapy

## 2024-07-18 DIAGNOSIS — M6281 Muscle weakness (generalized): Secondary | ICD-10-CM | POA: Diagnosis not present

## 2024-07-18 DIAGNOSIS — R42 Dizziness and giddiness: Secondary | ICD-10-CM | POA: Diagnosis not present

## 2024-07-18 DIAGNOSIS — R2681 Unsteadiness on feet: Secondary | ICD-10-CM

## 2024-07-18 DIAGNOSIS — R262 Difficulty in walking, not elsewhere classified: Secondary | ICD-10-CM | POA: Diagnosis not present

## 2024-07-18 NOTE — Therapy (Signed)
 OUTPATIENT PHYSICAL THERAPY NEURO TREATMENT   Patient Name: Rebecca Faulkner MRN: [id number] DOB:12/14/1940, 83 y.o., female Today's Date: 07/18/2024   PCP: Bernardo Fend, DO REFERRING PROVIDER: Lane Arthea BRAVO, MD  END OF SESSION:   PT End of Session - 07/18/24 0851     Visit Number 16    Number of Visits 49    Date for Recertification  08/01/24    PT Start Time 0801    PT Stop Time 0845    PT Time Calculation (min) 44 min    Equipment Utilized During Treatment Gait belt    Activity Tolerance Patient tolerated treatment well    Behavior During Therapy [facility] for tasks assessed/performed            Past Medical History:  Diagnosis Date   Breast cancer (HCC) 2013   right breast   Heart murmur 1990   Hypercholesteremia    Lump or mass in breast    Malignant neoplasm of upper-outer quadrant of female breast (HCC) 2013   R-breast T1, NO, MO, ER/PR pos., Her 2 neg.   Osteoporosis    Ulcer 1974   Past Surgical History:  Procedure Laterality Date   BREAST SURGERY Right 2013   CATARACT EXTRACTION W/PHACO Right 04/23/2016   Procedure: CATARACT EXTRACTION PHACO AND INTRAOCULAR LENS PLACEMENT (IOC) RIGHT EYE;  Surgeon: Dene Etienne, MD;  Location: [facility];  Service: Ophthalmology;  Laterality: Right;   CATARACT EXTRACTION W/PHACO Left 06/04/2016   Procedure: CATARACT EXTRACTION PHACO AND INTRAOCULAR LENS PLACEMENT (IOC);  Surgeon: Dene Etienne, MD;  Location: [facility];  Service: Ophthalmology;  Laterality: Left;   CESAREAN SECTION  1969   COLONOSCOPY WITH PROPOFOL  N/A 07/22/2017   Procedure: COLONOSCOPY WITH PROPOFOL ;  Surgeon: Dessa Reyes ORN, MD;  Location: [facility];  Service: Endoscopy;  Laterality: N/A;   FRACTURE SURGERY Left 2011   arm, plate and 12 screws   LEG SURGERY Right 01/02/2015   MASTECTOMY Right 2013   Right inguinal hernia repair  2013   VEIN SURGERY Left 2015   Patient Active Problem List   Diagnosis Date  Noted   History of diverticulitis 03/12/2020   Left ovarian cyst 03/12/2020   Atherosclerosis of aorta (HCC) 03/12/2020   Osteoporosis 08/05/2019   Closed comminuted intertrochanteric fracture of proximal femur (HCC) 07/27/2018   Mass of upper outer quadrant of left breast 06/01/2018   Goals of care, counseling/discussion 01/31/2018   Osteoporosis without current pathological fracture 07/28/2017   Depression, major, recurrent, mild (HCC) 07/07/2017   B12 deficiency 07/07/2017   Vitamin D  deficiency 07/07/2017   Screening for colon cancer 05/27/2017   Malignant neoplasm of upper-outer quadrant of right breast in female, estrogen receptor positive (HCC) 05/27/2017   Glaucoma 05/26/2017   History of alcoholism (HCC) 05/26/2017   History of hip fracture 05/10/2015   Breast cancer, right (HCC) 05/27/2012   Estrogen receptor positive status (ER+) 05/14/2012    ONSET DATE: 5 years ago  REFERRING DIAG:  R26.89 (ICD-10-CM) - Imbalance  G62.9 (ICD-10-CM) - Neuropathy    THERAPY DIAG:   Dizziness and giddiness  Difficulty in walking, not elsewhere classified  Unsteadiness on feet  Muscle weakness (generalized)  Rationale for Evaluation and Treatment: Rehabilitation  SUBJECTIVE:  SUBJECTIVE STATEMENT: Pt reports that she is doing fine today. Denies any updates, falls or stumbles. Reports that she is feeling much better, no lingering congestion this date. Denies any additional instances of feeling of flipping a switch.     Initial Eval: Pt is a pleasant 83 y/o female presenting to PT for imbalance. Pt ambulating with a SPC. She states she initially thought she had vertigo when she started losing her balance where onset of imbalance was about 5 years ago. Pt noticed imbalance with carrying tea while  ambulating; she felt she was going to fall/had someone help her. Pt reports she still gets dizzy off and on. She describes it as weak and sometimes the room moves. When she had an MRI recently she saw the room spin. Pt reports no falls in the last six months, but she has had near-falls where she grabs onto stuff.  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 She has neuropathy affecting bilat LE.  Pt accompanied by: friend Counsellor)  PERTINENT HISTORY: Per chart PMH significant for breast CA (R), hx hip fx, hx alcoholism, depression, B12 deficiency, Vitamin D  deficiency, osteoporosis without current pathological fx, mass of upper quadrant L breast, closed comminuted intertrochanteric fx of proximal femur, atherosclerosis of aorta, new diagnosis of normal pressure hydrocephalus  Neurology MD apt from 03/14/2024: Reviewed results of recent Brain MRI from 01/18/2024 in the office today. Results indicated mild ventriculomegaly with narrowing of the callosal angle. Symptoms indeterminate between normal pressure hydrocephalus and age-related changes at this time. Will continue to monitor at future visits.   Pt states she doesn't want to have a shunt put into her brain.  PAIN:  Are you having pain? General arthritis pain; she reports she takes Advil to help her pain  PRECAUTIONS: Fall, normal pressure hydrocephalus  RED FLAGS: Pt states she wears a diaper due to incontinence and says physician aware   WEIGHT BEARING RESTRICTIONS: No  FALLS: Has patient fallen in last 6 months? No  LIVING ENVIRONMENT: Lives with: lives alone Lives in: House/apartment Stairs: 7 steps to get into home, bilat rails Has following equipment at home: Single point cane, Grab bars, and grab bar in tub but does not use anymore  PLOF: indep with ADLs at home but is getting some help at work  PATIENT GOALS: she wants to walk without a cane; I don't  want to end up in a wheelchair   OBJECTIVE:  Note: Objective measures were completed at Evaluation unless otherwise noted.  DIAGNOSTIC FINDINGS:  EXAM 01/18/2024: MRI HEAD WITHOUT CONTRAST IMPRESSION: 1. Mild ventriculomegaly with narrowing of the callosal angle. Findings can be seen with normal pressure hydrocephalus in the appropriate clinical setting. 2. Chronic small vessel changes. 3. No acute infarct.     Electronically Signed   By: Clem Savory M.D.   On: 02/10/2024 11:19  CT HEAD 10/20/22: FINDINGS: Brain: No acute infarct or hemorrhage. Lateral ventricles and midline structures are stable. No acute extra-axial fluid collections. No mass effect.   Vascular: No hyperdense vessel or unexpected calcification.   Skull: Normal. Negative for fracture or focal lesion.   Sinuses/Orbits: Mild mucoperiosteal thickening throughout the ethmoid air cells. Remaining paranasal sinuses are clear.   Other: None.   IMPRESSION: 1. Ethmoid sinus disease. 2. No acute intracranial process.     Electronically Signed   By: Ozell Daring M.D.   On: 10/20/2022 16:14  COGNITION: Overall cognitive status: Within functional limits for tasks assessed   SENSATION: Pt has neuropathy affecting bilat LE and RUE; she describes sensation as numbness  COORDINATION: WFL BLE and BUE with rapid alt movement, chin<>target; shin>contrlat LE   EDEMA:  She reports every now and then she gets some swelling in BLE has been ongoing 4 years /not new   POSTURE: rounded shoulders, increased thoracic kyphosis    LOWER EXTREMITY MMT:   Grossly 4/5 bilat LE most deficits found proximal musculature   BED MOBILITY:  Rolling makes pt lightheaded   TRANSFERS: Able to complete hands-free in session with SBA  RAMP:  impaired confidence in balance on ramps per ABC questionnaire    STAIRS:  impaired confidence in balance on stairs per ABC questionnaire   GAIT: See below: decreased gait  speed, uses SPC, close CGA without AD, shuffling-like gait/decreased step length and decreased heel strike, limited arm-swing and hip extension bilat, L lateral sway in mid-stance  FUNCTIONAL TESTS:  5 times sit to stand: 13 sec, hands-free  Timed up and go (TUG): 19,5 sec no AD; 14 sec with SPC  10 meter walk test: 0.61 m/s with [facility] Berg Balance Scale: deferred    PATIENT SURVEYS:  ABC scale 58.75%  Vestibular Assessment from 02/22/2024:  OCULOMOTOR EXAM:  Ocular Alignment: normal, some skin drooping around R eye impacting alignment slightly, but relatively symmetrical  Ocular ROM: No Limitations  Spontaneous Nystagmus: absent  Gaze-Induced Nystagmus: absent  Smooth Pursuits: intact  Saccades: slow  Convergence/Divergence: appears normal  Cover/Cross-Cover: WNL  VESTIBULAR - OCULAR REFLEX:   Slow VOR: Normal  VOR Cancellation: Normal, when having pt perform it herself she moved her head more quickly and she said she felt a little lightheadedness  Head-Impulse Test: unable to test due to pt guarding  Dynamic Visual Acuity: to be tested if needed  POSITIONAL TESTING: Right Dix-Hallpike: upbeating, right nystagmus and Duration: <30 seconds Right Sidelying: symptomatic upon return to sitting upright Left Sidelying: no nystagmus and no symptoms  MOTION SENSITIVITY: to be tested if needed  Motion Sensitivity Quotient                                                                                                                              TREATMENT DATE: 07/18/2024 Pt ambulates into therapy clinic using hurricane with short, shuffled gait, and decreased gait speed - noticed L LE step length is shorter than R LE. Supervision provided for patient safety.  Unless otherwise stated, CGA was provided and gait belt donned in order to ensure pt safety throughout session.  Goal of therapy session continued to be work at increased intensity with more active rest breaks.  Agility ladder  3x down & back to promote increased step length, reciprocal gait pattern; completed without AD Verbal cueing for 1 foot in each square Pt able to progress to completing without any UE support for all repetitions Step ups 2x10 each LE, 1  min standing rest break between sets Verbal cueing to reduce to 1 UE support, relax shoulders to reduce guarded posture Pt with some SOB following activity, requiring seated rest break Walking beside ladder 3x down & back to promote carryover of increased step length outside of therapy sessions; without AD Pt continuing to not require any UE support for activity Turning/weaving through 3 cones ~3' apart; 3x down & back Pt with increased difficulty with carryover for increased step length with turns Sit to stands w/ 6# dumbbell at chest, 2x10 Verbal cueing for increased hip hinge/forward lean, sitting at edge of seat with controlled descent Walking beside ladder 2x 3 down & back to promote carryover of increased step length outside of therapy sessions; without AD Pt continuing to not require any UE support for activity Pt reporting neuropathy feeling at conclusion of activity, described at numbness from foot to shin area Requiring seated rest break between rounds Decreased step length with increased fatigue Walking beside ladder 3x down & back with AD to promote carryover of increased step length outside of therapy sessions Pt educated on goal of completing with AD to improve sequencing with AD in order to promote additional carryover Pt continuing to demo decreased step length with increased fatigue Continued cueing for relaxing upper body posture to promote increased arm swing Gait ~150' out of clinic with AD, cueing for increased step length, reciprocal pattern Pt continuing to demo decreased step length with increased fatigue  Pt educated that she could place tape on floor behind counter at work if able to mimic agility ladder rungs, as external cue is  very beneficial for patient to increase step length.     PATIENT EDUCATION: Education details: assessment findings, plan, goals Person educated: Patient and pt's friend present for eval  Education method: Explanation Education comprehension: verbalized understanding  HOME EXERCISE PROGRAM:  Access Code: [email or web address] URL: [email or web address] Date: 03/28/2024 Prepared by: Connell Kiss  Exercises - Sit to Stand with Counter Support  - 1 x daily - 7 x weekly - 2 sets - 10 reps - Side Stepping with Counter Support  - 1 x daily - 7 x weekly - 3 sets - 10 reps - Lunge with Counter Support  - 1 x daily - 3-5 x weekly - 2 sets - 10 reps - Narrow Stance with Unilateral Counter Support  - 1 x daily - 7 x weekly - 2 sets - 30 seconds hold - Standing Romberg to 1/2 Tandem Stance  - 1 x daily - 7 x weekly - 2 sets - 30 seconds hold   GOALS: Goals reviewed with patient? Yes  SHORT TERM GOALS: Target date: 06/20/2024  Patient will be independent in home exercise program to improve strength/mobility for better functional independence with ADLs. Baseline: initiated on 03/07/2024, updated on 03/28/2024 Goal status: INITIAL   LONG TERM GOALS: Target date: 08/01/2024   Patient will increase ABC scale score >80% to demonstrate better functional mobility and better confidence with ADLs.  Baseline: 58.75% 05/09/2024: 68.125% Goal status: IN PROGRESS  2.  Patient will complete five times sit to stand test in < 10 seconds indicating an increased LE strength and improved balance. Baseline: 13 sec 05/09/2024: 12.08 seconds with arms across chest Goal status: IN PROGRESS  3.  Patient will increase Berg Balance score by > 6 points to demonstrate decreased fall risk during functional activities Baseline: 02/29/2024: 36/56 05/09/2024: 36/56  Goal status: IN PROGRESS  4.  Patient will increase 10 meter walk test to >1.59m/s  as to improve gait speed for better community ambulation and to reduce  fall risk. Baseline: 0.61 m/s with [facility] 05/09/2024: Average Normal speed: 0.76 m/s & Average Fast speed: 0.89 m/s using hurricane 05/23/2024: Average Normal speed: 0.9165 m/s & Average Fast speed: 0.90 m/s using hurricane with SBA   Goal status: IN PROGRESS  5.  Patient will reduce timed up and go to <11 seconds to reduce fall risk and demonstrate improved transfer/gait ability. Baseline: 19.5 sec no AD, 14 sec with SPC 05/09/2024: 16.36 seconds using hurricane with SBA 05/23/2024:  Goal status: IN PROGRESS    ASSESSMENT:  CLINICAL IMPRESSION:   Patient is a pleasant 83 y.o. female who was seen today for physical therapy treatment for imbalance and neuropathy. Therapy session focused on increasing repetitions during dynamic gait training with focus on improved step length due to pt continuing to demo short, shuffled steps with only temporary ability to improve with cuing. Of note, pt able to demonstrate improved step length initially; decreased step length noted with increased fatigue. Pt able to complete dynamic gait this date without any HHA from therapist. Therapy session continued to focus on decreased rest break time and participating in more active rest breaks to promote overall increased activity tolerance. Patient continues to respond well to use of agility ladder as external target cuing for longer step lengths and will benefit from similar circuit style gait training in future sessions to promote carryover of improved gait mechanics. Patient will continue to benefit from higher intensity gait training with focus on improving gait speed and balance during gait to decrease fall risk. Ms. Rena will benefit from further skilled PT to improve impairments in order to decrease fall risk, and increase QOL and ease and safety with ADLs.    OBJECTIVE IMPAIRMENTS: Abnormal gait, decreased activity tolerance, decreased balance, decreased mobility, difficulty walking, decreased ROM, decreased  strength, dizziness, impaired sensation, impaired UE functional use, improper body mechanics, postural dysfunction, and pain.   ACTIVITY LIMITATIONS: carrying, lifting, bending, standing, squatting, stairs, transfers, bed mobility, bathing, and locomotion level  PARTICIPATION LIMITATIONS: meal prep, cleaning, shopping, community activity, occupation, and yard work  PERSONAL FACTORS: Age, Fitness, Sex, Time since onset of injury/illness/exacerbation, and 3+ comorbidities: Per chart PMH significant for breast CA (R), hx hip fx, hx alcoholism, depression, B12 deficiency, Vitamin D  deficiency, osteoporosis without current pathological fx, mass of upper quadrant L breast, closed comminuted intertrochanteric fx of proximal femur, atherosclerosis of aorta are also affecting patient's functional outcome.   REHAB POTENTIAL: Good  CLINICAL DECISION MAKING: Evolving/moderate complexity  EVALUATION COMPLEXITY: Moderate  PLAN:  PT FREQUENCY: 1-2x/week  PT DURATION: 12 weeks  PLANNED INTERVENTIONS: 97164- PT Re-evaluation, 97750- Physical Performance Testing, 97110-Therapeutic exercises, 97530- Therapeutic activity, [id number]- Neuromuscular re-education, 97535- Self Care, [id number]- Manual therapy, [id number]- Gait training, [id number]- Orthotic Initial, [phone]- Orthotic/Prosthetic subsequent, [phone]- Canalith repositioning, [phone]- Splinting, [phone]- Electrical stimulation (manual), Patient/Family education, Balance training, Stair training, Taping, Joint mobilization, Spinal mobilization, Vestibular training, DME instructions, Wheelchair mobility training, Cryotherapy, and Moist heat    PLAN FOR NEXT SESSION:  -re-cert at next visit* - review HEP - standing balance:  - reaching activities   - turning 360 degrees!  - alternating foot taps   - progression to semi-tandem  - fwd/back & side stepping over obstacles - dynamic gait training  - try treadmill*  - need to include turning more   - continue figure 8 gait  -  continue use of agility ladder  - focus on heel strike  -  include scanning of environment     Chiquita Silvan, SPT Physical Therapy Student - [facility]  [facility]  8:52 AM 07/18/24

## 2024-07-25 ENCOUNTER — Ambulatory Visit: Admitting: Physical Therapy

## 2024-08-01 ENCOUNTER — Ambulatory Visit: Attending: Neurology | Admitting: Physical Therapy

## 2024-08-01 DIAGNOSIS — R2681 Unsteadiness on feet: Secondary | ICD-10-CM | POA: Insufficient documentation

## 2024-08-01 DIAGNOSIS — R262 Difficulty in walking, not elsewhere classified: Secondary | ICD-10-CM | POA: Insufficient documentation

## 2024-08-01 DIAGNOSIS — R42 Dizziness and giddiness: Secondary | ICD-10-CM | POA: Diagnosis not present

## 2024-08-01 DIAGNOSIS — M6281 Muscle weakness (generalized): Secondary | ICD-10-CM | POA: Insufficient documentation

## 2024-08-01 NOTE — Therapy (Signed)
 OUTPATIENT PHYSICAL THERAPY NEURO TREATMENT/RE-CERT     Patient Name: Rebecca Faulkner MRN: [id number] DOB:12-Sep-1941, 83 y.o., female Today's Date: 08/01/2024   PCP: Bernardo Fend, DO REFERRING PROVIDER: Lane Arthea BRAVO, MD  END OF SESSION:   PT End of Session - 08/01/24 0803     Visit Number 17    Number of Visits 49    Date for Recertification  10/24/24    PT Start Time 0803    PT Stop Time 0849    PT Time Calculation (min) 46 min    Equipment Utilized During Treatment Gait belt    Activity Tolerance Patient tolerated treatment well    Behavior During Therapy [facility] for tasks assessed/performed             Past Medical History:  Diagnosis Date   Breast cancer (HCC) 2013   right breast   Heart murmur 1990   Hypercholesteremia    Lump or mass in breast    Malignant neoplasm of upper-outer quadrant of female breast (HCC) 2013   R-breast T1, NO, MO, ER/PR pos., Her 2 neg.   Osteoporosis    Ulcer 1974   Past Surgical History:  Procedure Laterality Date   BREAST SURGERY Right 2013   CATARACT EXTRACTION W/PHACO Right 04/23/2016   Procedure: CATARACT EXTRACTION PHACO AND INTRAOCULAR LENS PLACEMENT (IOC) RIGHT EYE;  Surgeon: Dene Etienne, MD;  Location: [facility];  Service: Ophthalmology;  Laterality: Right;   CATARACT EXTRACTION W/PHACO Left 06/04/2016   Procedure: CATARACT EXTRACTION PHACO AND INTRAOCULAR LENS PLACEMENT (IOC);  Surgeon: Dene Etienne, MD;  Location: [facility];  Service: Ophthalmology;  Laterality: Left;   CESAREAN SECTION  1969   COLONOSCOPY WITH PROPOFOL  N/A 07/22/2017   Procedure: COLONOSCOPY WITH PROPOFOL ;  Surgeon: Dessa Reyes ORN, MD;  Location: [facility];  Service: Endoscopy;  Laterality: N/A;   FRACTURE SURGERY Left 2011   arm, plate and 12 screws   LEG SURGERY Right 01/02/2015   MASTECTOMY Right 2013   Right inguinal hernia repair  2013   VEIN SURGERY Left 2015   Patient Active Problem List    Diagnosis Date Noted   History of diverticulitis 03/12/2020   Left ovarian cyst 03/12/2020   Atherosclerosis of aorta 03/12/2020   Osteoporosis 08/05/2019   Closed comminuted intertrochanteric fracture of proximal femur (HCC) 07/27/2018   Mass of upper outer quadrant of left breast 06/01/2018   Goals of care, counseling/discussion 01/31/2018   Osteoporosis without current pathological fracture 07/28/2017   Depression, major, recurrent, mild 07/07/2017   B12 deficiency 07/07/2017   Vitamin D  deficiency 07/07/2017   Screening for colon cancer 05/27/2017   Malignant neoplasm of upper-outer quadrant of right breast in female, estrogen receptor positive (HCC) 05/27/2017   Glaucoma 05/26/2017   History of alcoholism (HCC) 05/26/2017   History of hip fracture 05/10/2015   Breast cancer, right (HCC) 05/27/2012   Estrogen receptor positive status (ER+) 05/14/2012    ONSET DATE: 5 years ago  REFERRING DIAG:  R26.89 (ICD-10-CM) - Imbalance  G62.9 (ICD-10-CM) - Neuropathy    THERAPY DIAG:   Dizziness and giddiness  Muscle weakness (generalized)  Difficulty in walking, not elsewhere classified  Unsteadiness on feet  Rationale for Evaluation and Treatment: Rehabilitation  SUBJECTIVE:  SUBJECTIVE STATEMENT:   Pt reporting that she has been experiencing increased neuropathy symptoms over the last 4-5 days, affecting her walking. Has been taking 3 gabapentin at night for foot cramps.      Initial Eval: Pt is a pleasant 83 y/o female presenting to PT for imbalance. Pt ambulating with a SPC. She states she initially thought she had vertigo when she started losing her balance where onset of imbalance was about 5 years ago. Pt noticed imbalance with carrying tea while ambulating; she felt she was going to  fall/had someone help her. Pt reports she still gets dizzy off and on. She describes it as weak and sometimes the room moves. When she had an MRI recently she saw the room spin. Pt reports no falls in the last six months, but she has had near-falls where she grabs onto stuff.  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 She has neuropathy affecting bilat LE.  Pt accompanied by: friend Counsellor)  PERTINENT HISTORY: Per chart PMH significant for breast CA (R), hx hip fx, hx alcoholism, depression, B12 deficiency, Vitamin D  deficiency, osteoporosis without current pathological fx, mass of upper quadrant L breast, closed comminuted intertrochanteric fx of proximal femur, atherosclerosis of aorta, new diagnosis of normal pressure hydrocephalus  Neurology MD apt from 03/14/2024: Reviewed results of recent Brain MRI from 01/18/2024 in the office today. Results indicated mild ventriculomegaly with narrowing of the callosal angle. Symptoms indeterminate between normal pressure hydrocephalus and age-related changes at this time. Will continue to monitor at future visits.   Pt states she doesn't want to have a shunt put into her brain.  PAIN:  Are you having pain? General arthritis pain; she reports she takes Advil to help her pain  PRECAUTIONS: Fall, normal pressure hydrocephalus  RED FLAGS: Pt states she wears a diaper due to incontinence and says physician aware   WEIGHT BEARING RESTRICTIONS: No  FALLS: Has patient fallen in last 6 months? No  LIVING ENVIRONMENT: Lives with: lives alone Lives in: House/apartment Stairs: 7 steps to get into home, bilat rails Has following equipment at home: Single point cane, Grab bars, and grab bar in tub but does not use anymore  PLOF: indep with ADLs at home but is getting some help at work  PATIENT GOALS: she wants to walk without a cane; I don't want to end up in a wheelchair    OBJECTIVE:  Note: Objective measures were completed at Evaluation unless otherwise noted.  DIAGNOSTIC FINDINGS:  EXAM 01/18/2024: MRI HEAD WITHOUT CONTRAST IMPRESSION: 1. Mild ventriculomegaly with narrowing of the callosal angle. Findings can be seen with normal pressure hydrocephalus in the appropriate clinical setting. 2. Chronic small vessel changes. 3. No acute infarct.     Electronically Signed   By: Clem Savory M.D.   On: 02/10/2024 11:19  CT HEAD 10/20/22: FINDINGS: Brain: No acute infarct or hemorrhage. Lateral ventricles and midline structures are stable. No acute extra-axial fluid collections. No mass effect.   Vascular: No hyperdense vessel or unexpected calcification.   Skull: Normal. Negative for fracture or focal lesion.   Sinuses/Orbits: Mild mucoperiosteal thickening throughout the ethmoid air cells. Remaining paranasal sinuses are clear.   Other: None.   IMPRESSION: 1. Ethmoid sinus disease. 2. No acute intracranial process.     Electronically Signed   By: Ozell Daring M.D.   On: 10/20/2022 16:14  COGNITION: Overall cognitive status:  Within functional limits for tasks assessed   SENSATION: Pt has neuropathy affecting bilat LE and RUE; she describes sensation as numbness  COORDINATION: WFL BLE and BUE with rapid alt movement, chin<>target; shin>contrlat LE   EDEMA:  She reports every now and then she gets some swelling in BLE has been ongoing 4 years /not new   POSTURE: rounded shoulders, increased thoracic kyphosis    LOWER EXTREMITY MMT:   Grossly 4/5 bilat LE most deficits found proximal musculature   BED MOBILITY:  Rolling makes pt lightheaded   TRANSFERS: Able to complete hands-free in session with SBA  RAMP:  impaired confidence in balance on ramps per ABC questionnaire    STAIRS:  impaired confidence in balance on stairs per ABC questionnaire   GAIT: See below: decreased gait speed, uses SPC, close CGA  without AD, shuffling-like gait/decreased step length and decreased heel strike, limited arm-swing and hip extension bilat, L lateral sway in mid-stance  FUNCTIONAL TESTS:  5 times sit to stand: 13 sec, hands-free  Timed up and go (TUG): 19,5 sec no AD; 14 sec with SPC  10 meter walk test: 0.61 m/s with [facility] Berg Balance Scale: deferred    PATIENT SURVEYS:  ABC scale 58.75%  Vestibular Assessment from 02/22/2024:  OCULOMOTOR EXAM:  Ocular Alignment: normal, some skin drooping around R eye impacting alignment slightly, but relatively symmetrical  Ocular ROM: No Limitations  Spontaneous Nystagmus: absent  Gaze-Induced Nystagmus: absent  Smooth Pursuits: intact  Saccades: slow  Convergence/Divergence: appears normal  Cover/Cross-Cover: WNL  VESTIBULAR - OCULAR REFLEX:   Slow VOR: Normal  VOR Cancellation: Normal, when having pt perform it herself she moved her head more quickly and she said she felt a little lightheadedness  Head-Impulse Test: unable to test due to pt guarding  Dynamic Visual Acuity: to be tested if needed  POSITIONAL TESTING: Right Dix-Hallpike: upbeating, right nystagmus and Duration: <30 seconds Right Sidelying: symptomatic upon return to sitting upright Left Sidelying: no nystagmus and no symptoms  MOTION SENSITIVITY: to be tested if needed  Motion Sensitivity Quotient                                                                                                                              TREATMENT DATE: 08/01/2024  Pt ambulates into therapy clinic using hurricane with short, shuffled gait, and decreased gait speed - noticed L LE step length is shorter than R LE. Supervision provided for patient safety.  Unless otherwise stated, CGA was provided and gait belt donned in order to ensure pt safety throughout session. The Activities-Specific Balance Confidence (ABC) Scale 0% 10 20 30  40 50 60 70 80 90 100% No confidence<->completely confident  "How  confident are you that you will not lose your balance or become unsteady when you . . .   Date tested 08/01/24  Walk around the house 90%  2. Walk up or down stairs 90%  3. Bend over and pick  up a slipper from in front of a closet floor 90%  4. Reach for a small can off a shelf at eye level 90%  5. Stand on tip toes and reach for something above your head 70%  6. Stand on a chair and reach for something 0%  7. Sweep the floor 90%  8. Walk outside the house to a car parked in the driveway 90%  9. Get into or out of a car 90%  10. Walk across a parking lot to the mall 90%  11. Walk up or down a ramp 90%  12. Walk in a crowded mall where people rapidly walk past you 60%  13. Are bumped into by people as you walk through the mall 60%  14. Step onto or off of an escalator while you are holding onto the railing 0%  15. Step onto or off an escalator while holding onto parcels such that you cannot hold onto the railing 0%  16. Walk outside on icy sidewalks 20%  Total: #/16 63.75%    Five times Sit to Stand Test (FTSS) "Stand up and sit down as quickly as possible 5 times, keeping your arms folded across your chest."    TIME: 11.06 seconds without UE support  Times > 13.6 seconds is associated with increased disability and morbidity (Guralnik, 2000) Times > 15 seconds is predictive of recurrent falls in healthy individuals aged 27 and older (Buatois, et al., 2008) Normal performance values in community dwelling individuals aged 27 and older (Bohannon, 2006): 60-69 years: 11.4 seconds 70-79 years: 12.6 seconds 80-89 years: 14.8 seconds  MCID: >= 2.3 seconds for Vestibular Disorders (Meretta, 2006)   BERG:   [facility] PT Assessment - 08/01/24 0001       Berg Balance Test   Sit to Stand Able to stand without using hands and stabilize independently    Standing Unsupported Able to stand safely 2 minutes    Sitting with Back Unsupported but Feet Supported on Floor or Stool Able to sit safely  and securely 2 minutes    Stand to Sit Sits safely with minimal use of hands    Transfers Able to transfer safely, minor use of hands    Standing Unsupported with Eyes Closed Able to stand 10 seconds safely    Standing Unsupported with Feet Together Able to place feet together independently and stand 1 minute safely    From Standing, Reach Forward with Outstretched Arm Can reach forward >12 cm safely (5)    From Standing Position, Pick up Object from Floor Able to pick up shoe, needs supervision    From Standing Position, Turn to Look Behind Over each Shoulder Looks behind one side only/other side shows less weight shift    Turn 360 Degrees Able to turn 360 degrees safely but slowly    Standing Unsupported, Alternately Place Feet on Step/Stool Able to complete >2 steps/needs minimal assist    Standing Unsupported, One Foot in [employer] balance while stepping or standing   inc neuropathy   Standing on One Leg Tries to lift leg/unable to hold 3 seconds but remains standing independently    Total Score 41            10 Meter Walk Test: Patient instructed to walk 10 meters (32.8 ft) as quickly and as safely as possible at their normal speed x2 and at a fast speed x2. Time measured from 2 meter mark to 8 meter mark to accommodate ramp-up and ramp-down.  Normal  speed 1: 0.82 m/s (12.21 seconds) Normal speed 2: 0.77 m/s (12.98 seconds) Average Normal speed: 0.8 m/s with cane hovering Fast speed 1: 0.90 m/s (11.17 seconds) Fast speed 2: 0.90 m/s (11.13 seconds) Average normal speed: 0.90 m/s with use of cane Cut off scores: <0.4 m/s = household Ambulator, 0.4-0.8 m/s = limited community Ambulator, >0.8 m/s = community Ambulator, >1.2 m/s = crossing a street, <1.0 = increased fall risk MCID 0.05 m/s (small), 0.13 m/s (moderate), 0.06 m/s (significant)  (ANPTA Core Set of Outcome Measures for Adults with Neurologic Conditions, 2018)  Participated in Timed Up and Go (TUG): 1st trial: 12.82  seconds 2nd trial: 12.64 seconds  Average: 12.73 seconds with use of cane  Pt educated throughout session on results of physical performance measures indicative of progress with PT.     PATIENT EDUCATION: Education details: assessment findings, plan, goals Person educated: Patient and pt's friend present for eval  Education method: Explanation Education comprehension: verbalized understanding  HOME EXERCISE PROGRAM:  Access Code: [email or web address] URL: [email or web address] Date: 03/28/2024 Prepared by: Connell Kiss  Exercises - Sit to Stand with Counter Support  - 1 x daily - 7 x weekly - 2 sets - 10 reps - Side Stepping with Counter Support  - 1 x daily - 7 x weekly - 3 sets - 10 reps - Lunge with Counter Support  - 1 x daily - 3-5 x weekly - 2 sets - 10 reps - Narrow Stance with Unilateral Counter Support  - 1 x daily - 7 x weekly - 2 sets - 30 seconds hold - Standing Romberg to 1/2 Tandem Stance  - 1 x daily - 7 x weekly - 2 sets - 30 seconds hold   GOALS: Goals reviewed with patient? Yes  SHORT TERM GOALS: Target date: 09/12/24  Patient will be independent in home exercise program to improve strength/mobility for better functional independence with ADLs. Baseline: initiated on 03/07/2024, updated on 03/28/2024 Goal status: INITIAL   LONG TERM GOALS: Target date: 10/24/2024   Patient will increase ABC scale score >80% to demonstrate better functional mobility and better confidence with ADLs.  Baseline: 58.75% 05/09/2024: 68.125% 08/01/24: 63.75% Goal status: IN PROGRESS  2.  Patient will complete five times sit to stand test in < 10 seconds indicating an increased LE strength and improved balance. Baseline: 13 sec 05/09/2024: 12.08 seconds with arms across chest 08/01/24: 11.06 sec without UE support Goal status: IN PROGRESS  3.  Patient will increase Berg Balance score by > 6 points to demonstrate decreased fall risk during functional activities Baseline:  02/29/2024: 36/56 05/09/2024: 36/56  08/01/24: 41/56 Goal status: IN PROGRESS  4.  Patient will increase 10 meter walk test to >1.40m/s as to improve gait speed for better community ambulation and to reduce fall risk. Baseline: 0.61 m/s with [facility] 05/09/2024: Average Normal speed: 0.76 m/s & Average Fast speed: 0.89 m/s using hurricane 05/23/2024: Average Normal speed: 0.9165 m/s & Average Fast speed: 0.90 m/s using hurricane with SBA   08/01/24: Average Normal speed: 0.8 m/s with cane hovering; Average normal speed: 0.90 m/s with use of cane Goal status: IN PROGRESS  5.  Patient will reduce timed up and go to <11 seconds to reduce fall risk and demonstrate improved transfer/gait ability. Baseline: 19.5 sec no AD, 14 sec with SPC 05/09/2024: 16.36 seconds using hurricane with SBA 08/01/24: 12.73 seconds with use of cane Goal status: IN PROGRESS    ASSESSMENT:  CLINICAL IMPRESSION:   Patient is a pleasant  83 y.o. female who was seen today for physical therapy treatment/re-certification for imbalance and neuropathy. Therapy session focused on completing standardized physical performance measures & self-reported outcome measure to assess progress with PT. Pt without objective increase in ABC questionnaire, but pt with subjective report of increased safety awareness. Pt with no objective improvement in , but pt demonstrating some improved step length over short bouts. Pt with objective improvement with 5xSTS, BERG, & TUG this date. Pt with remaining deficits in turning 360 degrees, stair stepping, tandem stance, & SLS. Pt also continuing to demonstrate improved carryover with increased repetitions between sessions. Patient's condition has the potential to improve in response to therapy. Maximum improvement is yet to be obtained. The anticipated improvement is attainable and reasonable in a generally predictable time. Patient will continue to benefit from higher intensity gait training with focus on  improving gait speed and balance during gait to decrease fall risk. Ms. Mirelez will benefit from further skilled PT to improve impairments in order to decrease fall risk, and increase QOL and ease and safety with ADLs.    OBJECTIVE IMPAIRMENTS: Abnormal gait, decreased activity tolerance, decreased balance, decreased mobility, difficulty walking, decreased ROM, decreased strength, dizziness, impaired sensation, impaired UE functional use, improper body mechanics, postural dysfunction, and pain.   ACTIVITY LIMITATIONS: carrying, lifting, bending, standing, squatting, stairs, transfers, bed mobility, bathing, and locomotion level  PARTICIPATION LIMITATIONS: meal prep, cleaning, shopping, community activity, occupation, and yard work  PERSONAL FACTORS: Age, Fitness, Sex, Time since onset of injury/illness/exacerbation, and 3+ comorbidities: Per chart PMH significant for breast CA (R), hx hip fx, hx alcoholism, depression, B12 deficiency, Vitamin D  deficiency, osteoporosis without current pathological fx, mass of upper quadrant L breast, closed comminuted intertrochanteric fx of proximal femur, atherosclerosis of aorta are also affecting patient's functional outcome.   REHAB POTENTIAL: Good  CLINICAL DECISION MAKING: Evolving/moderate complexity  EVALUATION COMPLEXITY: Moderate  PLAN:  PT FREQUENCY: 1-2x/week  PT DURATION: 12 weeks  PLANNED INTERVENTIONS: [id number]- PT Re-evaluation, 97750- Physical Performance Testing, 97110-Therapeutic exercises, 97530- Therapeutic activity, [id number]- Neuromuscular re-education, 97535- Self Care, [id number]- Manual therapy, [id number]- Gait training, [phone]- Orthotic Initial, [phone]- Orthotic/Prosthetic subsequent, [phone]- Canalith repositioning, [phone]- Splinting, [phone]- Electrical stimulation (manual), Patient/Family education, Balance training, Stair training, Taping, Joint mobilization, Spinal mobilization, Vestibular training, DME instructions, Wheelchair mobility training,  Cryotherapy, and Moist heat    PLAN FOR NEXT SESSION:  - review HEP - standing balance:  - reaching activities   - turning 360 degrees!  - alternating foot taps   - progression to semi-tandem  - fwd/back & side stepping over obstacles - dynamic gait training  - try treadmill*  - need to include turning more   - continue figure 8 gait  - continue use of agility ladder  - focus on heel strike  - include scanning of environment     Chiquita Silvan, SPT Physical Therapy Student - [facility]  [facility]  9:05 AM 08/01/24

## 2024-08-08 ENCOUNTER — Ambulatory Visit: Admitting: Physical Therapy

## 2024-08-09 ENCOUNTER — Ambulatory Visit: Admitting: Physical Therapy

## 2024-08-15 ENCOUNTER — Ambulatory Visit: Admitting: Physical Therapy

## 2024-08-15 DIAGNOSIS — R2681 Unsteadiness on feet: Secondary | ICD-10-CM

## 2024-08-15 DIAGNOSIS — R262 Difficulty in walking, not elsewhere classified: Secondary | ICD-10-CM | POA: Diagnosis not present

## 2024-08-15 DIAGNOSIS — M6281 Muscle weakness (generalized): Secondary | ICD-10-CM

## 2024-08-15 DIAGNOSIS — R42 Dizziness and giddiness: Secondary | ICD-10-CM

## 2024-08-15 NOTE — Therapy (Signed)
 OUTPATIENT PHYSICAL THERAPY NEURO TREATMENT     Patient Name: Rebecca Faulkner MRN: [id number] DOB:01-31-1941, 83 y.o., female Today's Date: 08/15/2024   PCP: Bernardo Fend, DO REFERRING PROVIDER: Lane Arthea BRAVO, MD  END OF SESSION:   PT End of Session - 08/15/24 0929     Visit Number 18    Number of Visits 61    Date for Recertification  10/24/24    PT Start Time 0802    PT Stop Time 0845    PT Time Calculation (min) 43 min    Equipment Utilized During Treatment Other (comment)   Litegait   Activity Tolerance Patient tolerated treatment well    Behavior During Therapy [facility] for tasks assessed/performed              Past Medical History:  Diagnosis Date   Breast cancer (HCC) 2013   right breast   Heart murmur 1990   Hypercholesteremia    Lump or mass in breast    Malignant neoplasm of upper-outer quadrant of female breast (HCC) 2013   R-breast T1, NO, MO, ER/PR pos., Her 2 neg.   Osteoporosis    Ulcer 1974   Past Surgical History:  Procedure Laterality Date   BREAST SURGERY Right 2013   CATARACT EXTRACTION W/PHACO Right 04/23/2016   Procedure: CATARACT EXTRACTION PHACO AND INTRAOCULAR LENS PLACEMENT (IOC) RIGHT EYE;  Surgeon: Dene Etienne, MD;  Location: [facility];  Service: Ophthalmology;  Laterality: Right;   CATARACT EXTRACTION W/PHACO Left 06/04/2016   Procedure: CATARACT EXTRACTION PHACO AND INTRAOCULAR LENS PLACEMENT (IOC);  Surgeon: Dene Etienne, MD;  Location: [facility];  Service: Ophthalmology;  Laterality: Left;   CESAREAN SECTION  1969   COLONOSCOPY WITH PROPOFOL  N/A 07/22/2017   Procedure: COLONOSCOPY WITH PROPOFOL ;  Surgeon: Dessa Reyes ORN, MD;  Location: [facility];  Service: Endoscopy;  Laterality: N/A;   FRACTURE SURGERY Left 2011   arm, plate and 12 screws   LEG SURGERY Right 01/02/2015   MASTECTOMY Right 2013   Right inguinal hernia repair  2013   VEIN SURGERY Left 2015   Patient Active Problem  List   Diagnosis Date Noted   History of diverticulitis 03/12/2020   Left ovarian cyst 03/12/2020   Atherosclerosis of aorta 03/12/2020   Osteoporosis 08/05/2019   Closed comminuted intertrochanteric fracture of proximal femur (HCC) 07/27/2018   Mass of upper outer quadrant of left breast 06/01/2018   Goals of care, counseling/discussion 01/31/2018   Osteoporosis without current pathological fracture 07/28/2017   Depression, major, recurrent, mild 07/07/2017   B12 deficiency 07/07/2017   Vitamin D  deficiency 07/07/2017   Screening for colon cancer 05/27/2017   Malignant neoplasm of upper-outer quadrant of right breast in female, estrogen receptor positive (HCC) 05/27/2017   Glaucoma 05/26/2017   History of alcoholism (HCC) 05/26/2017   History of hip fracture 05/10/2015   Breast cancer, right (HCC) 05/27/2012   Estrogen receptor positive status (ER+) 05/14/2012    ONSET DATE: 5 years ago  REFERRING DIAG:  R26.89 (ICD-10-CM) - Imbalance  G62.9 (ICD-10-CM) - Neuropathy    THERAPY DIAG:   Dizziness and giddiness  Muscle weakness (generalized)  Difficulty in walking, not elsewhere classified  Unsteadiness on feet  Rationale for Evaluation and Treatment: Rehabilitation  SUBJECTIVE:  SUBJECTIVE STATEMENT:   Pt reports that she is feeling good today. Pt still experiencing increased neuropathy symptoms; had to go get my cane for me, I couldn't walk. while at work.   Pt reporting that she has been feeling more imbalanced the last couple days, thinks it may be related to the recent weather change.         Initial Eval: Pt is a pleasant 83 y/o female presenting to PT for imbalance. Pt ambulating with a SPC. She states she initially thought she had vertigo when she started losing her balance  where onset of imbalance was about 5 years ago. Pt noticed imbalance with carrying tea while ambulating; she felt she was going to fall/had someone help her. Pt reports she still gets dizzy off and on. She describes it as weak and sometimes the room moves. When she had an MRI recently she saw the room spin. Pt reports no falls in the last six months, but she has had near-falls where she grabs onto stuff.  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 She has neuropathy affecting bilat LE.  Pt accompanied by: friend Counsellor)  PERTINENT HISTORY: Per chart PMH significant for breast CA (R), hx hip fx, hx alcoholism, depression, B12 deficiency, Vitamin D  deficiency, osteoporosis without current pathological fx, mass of upper quadrant L breast, closed comminuted intertrochanteric fx of proximal femur, atherosclerosis of aorta, new diagnosis of normal pressure hydrocephalus  Neurology MD apt from 03/14/2024: Reviewed results of recent Brain MRI from 01/18/2024 in the office today. Results indicated mild ventriculomegaly with narrowing of the callosal angle. Symptoms indeterminate between normal pressure hydrocephalus and age-related changes at this time. Will continue to monitor at future visits.   Pt states she doesn't want to have a shunt put into her brain.  PAIN:  Are you having pain? General arthritis pain; she reports she takes Advil to help her pain  PRECAUTIONS: Fall, normal pressure hydrocephalus  RED FLAGS: Pt states she wears a diaper due to incontinence and says physician aware   WEIGHT BEARING RESTRICTIONS: No  FALLS: Has patient fallen in last 6 months? No  LIVING ENVIRONMENT: Lives with: lives alone Lives in: House/apartment Stairs: 7 steps to get into home, bilat rails Has following equipment at home: Single point cane, Grab bars, and grab bar in tub but does not use anymore  PLOF: indep with ADLs at home but  is getting some help at work  PATIENT GOALS: she wants to walk without a cane; I don't want to end up in a wheelchair   OBJECTIVE:  Note: Objective measures were completed at Evaluation unless otherwise noted.  DIAGNOSTIC FINDINGS:  EXAM 01/18/2024: MRI HEAD WITHOUT CONTRAST IMPRESSION: 1. Mild ventriculomegaly with narrowing of the callosal angle. Findings can be seen with normal pressure hydrocephalus in the appropriate clinical setting. 2. Chronic small vessel changes. 3. No acute infarct.     Electronically Signed   By: Clem Savory M.D.   On: 02/10/2024 11:19  CT HEAD 10/20/22: FINDINGS: Brain: No acute infarct or hemorrhage. Lateral ventricles and midline structures are stable. No acute extra-axial fluid collections. No mass effect.   Vascular: No hyperdense vessel or unexpected calcification.   Skull: Normal. Negative for fracture or focal lesion.   Sinuses/Orbits: Mild mucoperiosteal thickening throughout the ethmoid air cells. Remaining paranasal sinuses are clear.   Other: None.   IMPRESSION: 1. Ethmoid sinus disease.  2. No acute intracranial process.     Electronically Signed   By: Ozell Daring M.D.   On: 10/20/2022 16:14  COGNITION: Overall cognitive status: Within functional limits for tasks assessed   SENSATION: Pt has neuropathy affecting bilat LE and RUE; she describes sensation as numbness  COORDINATION: WFL BLE and BUE with rapid alt movement, chin<>target; shin>contrlat LE   EDEMA:  She reports every now and then she gets some swelling in BLE has been ongoing 4 years /not new   POSTURE: rounded shoulders, increased thoracic kyphosis    LOWER EXTREMITY MMT:   Grossly 4/5 bilat LE most deficits found proximal musculature   BED MOBILITY:  Rolling makes pt lightheaded   TRANSFERS: Able to complete hands-free in session with SBA  RAMP:  impaired confidence in balance on ramps per ABC questionnaire    STAIRS:  impaired  confidence in balance on stairs per ABC questionnaire   GAIT: See below: decreased gait speed, uses SPC, close CGA without AD, shuffling-like gait/decreased step length and decreased heel strike, limited arm-swing and hip extension bilat, L lateral sway in mid-stance  FUNCTIONAL TESTS:  5 times sit to stand: 13 sec, hands-free  Timed up and go (TUG): 19,5 sec no AD; 14 sec with SPC  10 meter walk test: 0.61 m/s with [facility] Berg Balance Scale: deferred    PATIENT SURVEYS:  ABC scale 58.75%  Vestibular Assessment from 02/22/2024:  OCULOMOTOR EXAM:  Ocular Alignment: normal, some skin drooping around R eye impacting alignment slightly, but relatively symmetrical  Ocular ROM: No Limitations  Spontaneous Nystagmus: absent  Gaze-Induced Nystagmus: absent  Smooth Pursuits: intact  Saccades: slow  Convergence/Divergence: appears normal  Cover/Cross-Cover: WNL  VESTIBULAR - OCULAR REFLEX:   Slow VOR: Normal  VOR Cancellation: Normal, when having pt perform it herself she moved her head more quickly and she said she felt a little lightheadedness  Head-Impulse Test: unable to test due to pt guarding  Dynamic Visual Acuity: to be tested if needed  POSITIONAL TESTING: Right Dix-Hallpike: upbeating, right nystagmus and Duration: <30 seconds Right Sidelying: symptomatic upon return to sitting upright Left Sidelying: no nystagmus and no symptoms  MOTION SENSITIVITY: to be tested if needed  Motion Sensitivity Quotient                                                                                                                              TREATMENT DATE: 08/15/2024  Pt ambulates into therapy clinic using hurricane with short, shuffled gait, and decreased gait speed - noticed L LE step length is shorter than R LE. Supervision provided for patient safety.  Pt educated on use of treadmill & Litegait harness system for gait training. Pt agreeable to trial.   6 min: 0.4>0.8 mph,  totaling 359'; 89 bpm immediately after External target of RTB for increased step length. Facilitation of increased weight shift towards R LE via tactile cueing.  Pt described as somewhat hard, RPE  12/20, stating that it was more of a mental challenge like secondary to motor planning difficulties.  BUE support throughout at handle bars 6 min: 0.6>0.8 mph, totaling 408'; 83 bpm immediately Continued use of external target of RTB for increased step length RPE: 13/20; continuing to report as mental challenge, LLE feeling fatigued BUE support throughout at handle bars   Pt reporting that her L ankle feeling very fatigued at conclusion of session.     PATIENT EDUCATION: Education details: assessment findings, plan, goals Person educated: Patient and pt's friend present for eval  Education method: Explanation Education comprehension: verbalized understanding  HOME EXERCISE PROGRAM:  Access Code: [email or web address] URL: [email or web address] Date: 03/28/2024 Prepared by: Connell Kiss  Exercises - Sit to Stand with Counter Support  - 1 x daily - 7 x weekly - 2 sets - 10 reps - Side Stepping with Counter Support  - 1 x daily - 7 x weekly - 3 sets - 10 reps - Lunge with Counter Support  - 1 x daily - 3-5 x weekly - 2 sets - 10 reps - Narrow Stance with Unilateral Counter Support  - 1 x daily - 7 x weekly - 2 sets - 30 seconds hold - Standing Romberg to 1/2 Tandem Stance  - 1 x daily - 7 x weekly - 2 sets - 30 seconds hold   GOALS: Goals reviewed with patient? Yes  SHORT TERM GOALS: Target date: 09/12/24  Patient will be independent in home exercise program to improve strength/mobility for better functional independence with ADLs. Baseline: initiated on 03/07/2024, updated on 03/28/2024 Goal status: INITIAL   LONG TERM GOALS: Target date: 10/24/2024   Patient will increase ABC scale score >80% to demonstrate better functional mobility and better confidence with ADLs.   Baseline: 58.75% 05/09/2024: 68.125% 08/01/24: 63.75% Goal status: IN PROGRESS  2.  Patient will complete five times sit to stand test in < 10 seconds indicating an increased LE strength and improved balance. Baseline: 13 sec 05/09/2024: 12.08 seconds with arms across chest 08/01/24: 11.06 sec without UE support Goal status: IN PROGRESS  3.  Patient will increase Berg Balance score by > 6 points to demonstrate decreased fall risk during functional activities Baseline: 02/29/2024: 36/56 05/09/2024: 36/56  08/01/24: 41/56 Goal status: IN PROGRESS  4.  Patient will increase 10 meter walk test to >1.76m/s as to improve gait speed for better community ambulation and to reduce fall risk. Baseline: 0.61 m/s with [facility] 05/09/2024: Average Normal speed: 0.76 m/s & Average Fast speed: 0.89 m/s using hurricane 05/23/2024: Average Normal speed: 0.9165 m/s & Average Fast speed: 0.90 m/s using hurricane with SBA   08/01/24: Average Normal speed: 0.8 m/s with cane hovering; Average normal speed: 0.90 m/s with use of cane Goal status: IN PROGRESS  5.  Patient will reduce timed up and go to <11 seconds to reduce fall risk and demonstrate improved transfer/gait ability. Baseline: 19.5 sec no AD, 14 sec with SPC 05/09/2024: 16.36 seconds using hurricane with SBA 08/01/24: 12.73 seconds with use of cane Goal status: IN PROGRESS    ASSESSMENT:  CLINICAL IMPRESSION:   Patient is a pleasant 83 y.o. female who was seen today for physical therapy treatment for imbalance and neuropathy. Pt educated on benefit, purpose of Litegait treadmill training for increased repetitions to promote increased functional carryover. Pt completing 2 bouts of 6 min. First bout, pt increasing from 0.4.0.8 mph, intermittently providing tactile cueing for increased weight shift onto RLE. On second bout, pt able to increase  starting speed & increase overall ambulation distance. Pt requiring verbal cueing throughout for increased step length,  increased weight shift. Pt continuing to benefit from external targets for increased step length. Ms. Kithcart will benefit from further skilled PT to improve impairments in order to decrease fall risk, and increase QOL and ease and safety with ADLs.    OBJECTIVE IMPAIRMENTS: Abnormal gait, decreased activity tolerance, decreased balance, decreased mobility, difficulty walking, decreased ROM, decreased strength, dizziness, impaired sensation, impaired UE functional use, improper body mechanics, postural dysfunction, and pain.   ACTIVITY LIMITATIONS: carrying, lifting, bending, standing, squatting, stairs, transfers, bed mobility, bathing, and locomotion level  PARTICIPATION LIMITATIONS: meal prep, cleaning, shopping, community activity, occupation, and yard work  PERSONAL FACTORS: Age, Fitness, Sex, Time since onset of injury/illness/exacerbation, and 3+ comorbidities: Per chart PMH significant for breast CA (R), hx hip fx, hx alcoholism, depression, B12 deficiency, Vitamin D  deficiency, osteoporosis without current pathological fx, mass of upper quadrant L breast, closed comminuted intertrochanteric fx of proximal femur, atherosclerosis of aorta are also affecting patient's functional outcome.   REHAB POTENTIAL: Good  CLINICAL DECISION MAKING: Evolving/moderate complexity  EVALUATION COMPLEXITY: Moderate  PLAN:  PT FREQUENCY: 1-2x/week  PT DURATION: 12 weeks  PLANNED INTERVENTIONS: 97164- PT Re-evaluation, 97750- Physical Performance Testing, 97110-Therapeutic exercises, 97530- Therapeutic activity, [id number]- Neuromuscular re-education, 97535- Self Care, [id number]- Manual therapy, [id number]- Gait training, [phone]- Orthotic Initial, [phone]- Orthotic/Prosthetic subsequent, [id number]- Canalith repositioning, [phone]- Splinting, [phone]- Electrical stimulation (manual), Patient/Family education, Balance training, Stair training, Taping, Joint mobilization, Spinal mobilization, Vestibular training, DME instructions,  Wheelchair mobility training, Cryotherapy, and Moist heat    PLAN FOR NEXT SESSION:  - review HEP - standing balance:  - reaching activities   - turning 360 degrees!  - alternating foot taps   - progression to semi-tandem  - fwd/back & side stepping over obstacles - dynamic gait training  - continue treadmill training*  - need to include turning more   - continue figure 8 gait  - continue use of agility ladder  - focus on heel strike  - include scanning of environment     Chiquita Silvan, SPT Physical Therapy Student - [facility]  [facility]  6:25 PM 08/15/24

## 2024-08-22 ENCOUNTER — Ambulatory Visit: Admitting: Physical Therapy

## 2024-08-22 ENCOUNTER — Ambulatory Visit: Admitting: Internal Medicine

## 2024-08-22 DIAGNOSIS — R42 Dizziness and giddiness: Secondary | ICD-10-CM

## 2024-08-22 DIAGNOSIS — R2681 Unsteadiness on feet: Secondary | ICD-10-CM

## 2024-08-22 DIAGNOSIS — R262 Difficulty in walking, not elsewhere classified: Secondary | ICD-10-CM | POA: Diagnosis not present

## 2024-08-22 DIAGNOSIS — M6281 Muscle weakness (generalized): Secondary | ICD-10-CM

## 2024-08-22 NOTE — Therapy (Signed)
 OUTPATIENT PHYSICAL THERAPY NEURO TREATMENT     Patient Name: Rebecca Faulkner MRN: [id number] DOB:[birth year], 83 y.o., female Today's Date: 08/22/2024   PCP: Bernardo Fend, DO REFERRING PROVIDER: Lane Arthea BRAVO, MD  END OF SESSION:   PT End of Session - 08/22/24 0807     Visit Number 19    Number of Visits 61    Date for Recertification  10/24/24    PT Start Time 0802    PT Stop Time 0844    PT Time Calculation (min) 42 min    Equipment Utilized During Treatment Other (comment)   Litegait   Activity Tolerance Patient tolerated treatment well    Behavior During Therapy [facility] for tasks assessed/performed               Past Medical History:  Diagnosis Date   Breast cancer (HCC) 2013   right breast   Heart murmur 1990   Hypercholesteremia    Lump or mass in breast    Malignant neoplasm of upper-outer quadrant of female breast (HCC) 2013   R-breast T1, NO, MO, ER/PR pos., Her 2 neg.   Osteoporosis    Ulcer 1974   Past Surgical History:  Procedure Laterality Date   BREAST SURGERY Right 2013   CATARACT EXTRACTION W/PHACO Right 04/23/2016   Procedure: CATARACT EXTRACTION PHACO AND INTRAOCULAR LENS PLACEMENT (IOC) RIGHT EYE;  Surgeon: Dene Etienne, MD;  Location: [facility];  Service: Ophthalmology;  Laterality: Right;   CATARACT EXTRACTION W/PHACO Left 06/04/2016   Procedure: CATARACT EXTRACTION PHACO AND INTRAOCULAR LENS PLACEMENT (IOC);  Surgeon: Dene Etienne, MD;  Location: [facility];  Service: Ophthalmology;  Laterality: Left;   CESAREAN SECTION  1969   COLONOSCOPY WITH PROPOFOL  N/A 07/22/2017   Procedure: COLONOSCOPY WITH PROPOFOL ;  Surgeon: Dessa Reyes ORN, MD;  Location: [facility];  Service: Endoscopy;  Laterality: N/A;   FRACTURE SURGERY Left 2011   arm, plate and 12 screws   LEG SURGERY Right 01/02/2015   MASTECTOMY Right 2013   Right inguinal hernia repair  2013   VEIN SURGERY Left 2015   Patient Active  Problem List   Diagnosis Date Noted   History of diverticulitis 03/12/2020   Left ovarian cyst 03/12/2020   Atherosclerosis of aorta 03/12/2020   Osteoporosis 08/05/2019   Closed comminuted intertrochanteric fracture of proximal femur (HCC) 07/27/2018   Mass of upper outer quadrant of left breast 06/01/2018   Goals of care, counseling/discussion 01/31/2018   Osteoporosis without current pathological fracture 07/28/2017   Depression, major, recurrent, mild 07/07/2017   B12 deficiency 07/07/2017   Vitamin D  deficiency 07/07/2017   Screening for colon cancer 05/27/2017   Malignant neoplasm of upper-outer quadrant of right breast in female, estrogen receptor positive (HCC) 05/27/2017   Glaucoma 05/26/2017   History of alcoholism (HCC) 05/26/2017   History of hip fracture 05/10/2015   Breast cancer, right (HCC) 05/27/2012   Estrogen receptor positive status (ER+) 05/14/2012    ONSET DATE: 5 years ago  REFERRING DIAG:  R26.89 (ICD-10-CM) - Imbalance  G62.9 (ICD-10-CM) - Neuropathy    THERAPY DIAG:   Dizziness and giddiness  Difficulty in walking, not elsewhere classified  Muscle weakness (generalized)  Unsteadiness on feet  Rationale for Evaluation and Treatment: Rehabilitation  SUBJECTIVE:  SUBJECTIVE STATEMENT:    Pt reports that she is doing good today, but her legs are feeling a little stiff with the rain this morning. Continuing to experience increased neuropathy symptoms.           Initial Eval: Pt is a pleasant 83 y/o female presenting to PT for imbalance. Pt ambulating with a SPC. She states she initially thought she had vertigo when she started losing her balance where onset of imbalance was about 5 years ago. Pt noticed imbalance with carrying tea while ambulating; she felt she  was going to fall/had someone help her. Pt reports she still gets dizzy off and on. She describes it as weak and sometimes the room moves. When she had an MRI recently she saw the room spin. Pt reports no falls in the last six months, but she has had near-falls where she grabs onto stuff.  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 She has neuropathy affecting bilat LE.  Pt accompanied by: friend Counsellor)  PERTINENT HISTORY: Per chart PMH significant for breast CA (R), hx hip fx, hx alcoholism, depression, B12 deficiency, Vitamin D  deficiency, osteoporosis without current pathological fx, mass of upper quadrant L breast, closed comminuted intertrochanteric fx of proximal femur, atherosclerosis of aorta, new diagnosis of normal pressure hydrocephalus  Neurology MD apt from 03/14/2024: Reviewed results of recent Brain MRI from 01/18/2024 in the office today. Results indicated mild ventriculomegaly with narrowing of the callosal angle. Symptoms indeterminate between normal pressure hydrocephalus and age-related changes at this time. Will continue to monitor at future visits.   Pt states she doesn't want to have a shunt put into her brain.  PAIN:  Are you having pain? General arthritis pain; she reports she takes Advil to help her pain  PRECAUTIONS: Fall, normal pressure hydrocephalus  RED FLAGS: Pt states she wears a diaper due to incontinence and says physician aware   WEIGHT BEARING RESTRICTIONS: No  FALLS: Has patient fallen in last 6 months? No  LIVING ENVIRONMENT: Lives with: lives alone Lives in: House/apartment Stairs: 7 steps to get into home, bilat rails Has following equipment at home: Single point cane, Grab bars, and grab bar in tub but does not use anymore  PLOF: indep with ADLs at home but is getting some help at work  PATIENT GOALS: she wants to walk without a cane; I don't want to end up in a  wheelchair   OBJECTIVE:  Note: Objective measures were completed at Evaluation unless otherwise noted.  DIAGNOSTIC FINDINGS:  EXAM 01/18/2024: MRI HEAD WITHOUT CONTRAST IMPRESSION: 1. Mild ventriculomegaly with narrowing of the callosal angle. Findings can be seen with normal pressure hydrocephalus in the appropriate clinical setting. 2. Chronic small vessel changes. 3. No acute infarct.     Electronically Signed   By: Clem Savory M.D.   On: 02/10/2024 11:19  CT HEAD 10/20/22: FINDINGS: Brain: No acute infarct or hemorrhage. Lateral ventricles and midline structures are stable. No acute extra-axial fluid collections. No mass effect.   Vascular: No hyperdense vessel or unexpected calcification.   Skull: Normal. Negative for fracture or focal lesion.   Sinuses/Orbits: Mild mucoperiosteal thickening throughout the ethmoid air cells. Remaining paranasal sinuses are clear.   Other: None.   IMPRESSION: 1. Ethmoid sinus disease. 2. No acute intracranial process.     Electronically Signed   By: Ozell Daring M.D.   On: 10/20/2022 16:14  COGNITION: Overall cognitive status: Within functional limits for tasks assessed   SENSATION: Pt has neuropathy affecting bilat LE and RUE; she describes sensation as numbness  COORDINATION: WFL BLE and BUE with rapid alt movement, chin<>target; shin>contrlat LE   EDEMA:  She reports every now and then she gets some swelling in BLE has been ongoing 4 years /not new   POSTURE: rounded shoulders, increased thoracic kyphosis    LOWER EXTREMITY MMT:   Grossly 4/5 bilat LE most deficits found proximal musculature   BED MOBILITY:  Rolling makes pt lightheaded   TRANSFERS: Able to complete hands-free in session with SBA  RAMP:  impaired confidence in balance on ramps per ABC questionnaire    STAIRS:  impaired confidence in balance on stairs per ABC questionnaire   GAIT: See below: decreased gait speed, uses SPC,  close CGA without AD, shuffling-like gait/decreased step length and decreased heel strike, limited arm-swing and hip extension bilat, L lateral sway in mid-stance  FUNCTIONAL TESTS:  5 times sit to stand: 13 sec, hands-free  Timed up and go (TUG): 19,5 sec no AD; 14 sec with SPC  10 meter walk test: 0.61 m/s with [facility] Berg Balance Scale: deferred    PATIENT SURVEYS:  ABC scale 58.75%  Vestibular Assessment from 02/22/2024:  OCULOMOTOR EXAM:  Ocular Alignment: normal, some skin drooping around R eye impacting alignment slightly, but relatively symmetrical  Ocular ROM: No Limitations  Spontaneous Nystagmus: absent  Gaze-Induced Nystagmus: absent  Smooth Pursuits: intact  Saccades: slow  Convergence/Divergence: appears normal  Cover/Cross-Cover: WNL  VESTIBULAR - OCULAR REFLEX:   Slow VOR: Normal  VOR Cancellation: Normal, when having pt perform it herself she moved her head more quickly and she said she felt a little lightheadedness  Head-Impulse Test: unable to test due to pt guarding  Dynamic Visual Acuity: to be tested if needed  POSITIONAL TESTING: Right Dix-Hallpike: upbeating, right nystagmus and Duration: <30 seconds Right Sidelying: symptomatic upon return to sitting upright Left Sidelying: no nystagmus and no symptoms  MOTION SENSITIVITY: to be tested if needed  Motion Sensitivity Quotient                                                                                                                              TREATMENT DATE: 08/22/2024   Pt ambulates into therapy clinic using hurricane with short, shuffled gait, and decreased gait speed - noticed L LE step length is shorter than R LE. Supervision provided for patient safety.  Pt educated on use of treadmill & Litegait harness system for gait training. Pt agreeable to trial.   6 min: 0.6>0.8 mph, totaling 408' External target of YTB for increased step length Pt described as somewhat hard, RPE 14/20. Pt able to  complete improved step length more consistently with gait this date.  BUE support throughout at handle bars 6 min: 0.6>1.0 mph, totaling 473'  Continued use of external target of YTB for increased step length  RPE: 14/20; continuing to report as mental challenge BUE support throughout at handle bars; verbal cueing to increase distance between herself & bars to promote increased step length Intermittently added AW on belt as external cue for improved foot clearance; pt benefiting from cue L>R Of note, pt with most consistent increased step length with increased speed.   Complated gait with use of hurricane >59' out of clinic; pt noted to have improved step length carryover compared to previous session following treadmill training.   Discussed options with pt regarding POC; female therapists at this clinic leaving in 2 weeks, option to trial PT with different therapist, likely benefit of continued PT. Pt & friend agreeing to think about POC over next week, plan to discuss further at next session.      PATIENT EDUCATION: Education details: assessment findings, plan, goals Person educated: Patient and pt's friend present for eval  Education method: Explanation Education comprehension: verbalized understanding  HOME EXERCISE PROGRAM:  Access Code: [email or web address] URL: [email or web address] Date: 03/28/2024 Prepared by: Connell Kiss  Exercises - Sit to Stand with Counter Support  - 1 x daily - 7 x weekly - 2 sets - 10 reps - Side Stepping with Counter Support  - 1 x daily - 7 x weekly - 3 sets - 10 reps - Lunge with Counter Support  - 1 x daily - 3-5 x weekly - 2 sets - 10 reps - Narrow Stance with Unilateral Counter Support  - 1 x daily - 7 x weekly - 2 sets - 30 seconds hold - Standing Romberg to 1/2 Tandem Stance  - 1 x daily - 7 x weekly - 2 sets - 30 seconds hold   GOALS: Goals reviewed with patient? Yes  SHORT TERM GOALS: Target date: 09/12/24  Patient will be  independent in home exercise program to improve strength/mobility for better functional independence with ADLs. Baseline: initiated on 03/07/2024, updated on 03/28/2024 Goal status: INITIAL   LONG TERM GOALS: Target date: 10/24/2024   Patient will increase ABC scale score >80% to demonstrate better functional mobility and better confidence with ADLs.  Baseline: 58.75% 05/09/2024: 68.125% 08/01/24: 63.75% Goal status: IN PROGRESS  2.  Patient will complete five times sit to stand test in < 10 seconds indicating an increased LE strength and improved balance. Baseline: 13 sec 05/09/2024: 12.08 seconds with arms across chest 08/01/24: 11.06 sec without UE support Goal status: IN PROGRESS  3.  Patient will increase Berg Balance score by > 6 points to demonstrate decreased fall risk during functional activities Baseline: 02/29/2024: 36/56 05/09/2024: 36/56  08/01/24: 41/56 Goal status: IN PROGRESS  4.  Patient will increase 10 meter walk test to >1.16m/s as to improve gait speed for better community ambulation and to reduce fall risk. Baseline: 0.61 m/s with [facility] 05/09/2024: Average Normal speed: 0.76 m/s & Average Fast speed: 0.89 m/s using hurricane 05/23/2024: Average Normal speed: 0.9165 m/s & Average Fast speed: 0.90 m/s using hurricane with SBA   08/01/24: Average Normal speed: 0.8 m/s with cane hovering; Average normal speed: 0.90 m/s with use of cane Goal status: IN PROGRESS  5.  Patient will reduce timed up and go to <11 seconds to reduce fall risk and demonstrate improved transfer/gait ability. Baseline: 19.5 sec no AD, 14 sec with SPC 05/09/2024: 16.36 seconds using hurricane with SBA 08/01/24: 12.73 seconds with use of cane Goal status: IN PROGRESS    ASSESSMENT:  CLINICAL IMPRESSION:   Patient is a pleasant 83 y.o. female who was seen  today for physical therapy treatment for imbalance and neuropathy. Pt completing 2 bouts of 6 min of treadmill training using Litegait system. First  bout, pt increasing from 0.6>0.8 mph, continuing use of external cue of YTB for increased step length. On second bout, pt able to increase overall speed & continue to increase overall ambulation distance; of note, pt with most consistent increased step length with increased speed. During second bout, pt also able to complete stepping over AW as external target for improved foot clearance. Pt continuing to require verbal cueing throughout for increased step length. Pt continuing to benefit from external targets for increased step length. Ms. Napierkowski will benefit from further skilled PT to improve impairments in order to decrease fall risk, and increase QOL and ease and safety with ADLs.    OBJECTIVE IMPAIRMENTS: Abnormal gait, decreased activity tolerance, decreased balance, decreased mobility, difficulty walking, decreased ROM, decreased strength, dizziness, impaired sensation, impaired UE functional use, improper body mechanics, postural dysfunction, and pain.   ACTIVITY LIMITATIONS: carrying, lifting, bending, standing, squatting, stairs, transfers, bed mobility, bathing, and locomotion level  PARTICIPATION LIMITATIONS: meal prep, cleaning, shopping, community activity, occupation, and yard work  PERSONAL FACTORS: Age, Fitness, Sex, Time since onset of injury/illness/exacerbation, and 3+ comorbidities: Per chart PMH significant for breast CA (R), hx hip fx, hx alcoholism, depression, B12 deficiency, Vitamin D  deficiency, osteoporosis without current pathological fx, mass of upper quadrant L breast, closed comminuted intertrochanteric fx of proximal femur, atherosclerosis of aorta are also affecting patient's functional outcome.   REHAB POTENTIAL: Good  CLINICAL DECISION MAKING: Evolving/moderate complexity  EVALUATION COMPLEXITY: Moderate  PLAN:  PT FREQUENCY: 1-2x/week  PT DURATION: 12 weeks  PLANNED INTERVENTIONS: 97164- PT Re-evaluation, 97750- Physical Performance Testing,  97110-Therapeutic exercises, 97530- Therapeutic activity, [id number]- Neuromuscular re-education, 97535- Self Care, [id number]- Manual therapy, [id number]- Gait training, [phone]- Orthotic Initial, [phone]- Orthotic/Prosthetic subsequent, [phone]- Canalith repositioning, [phone]- Splinting, [phone]- Electrical stimulation (manual), Patient/Family education, Balance training, Stair training, Taping, Joint mobilization, Spinal mobilization, Vestibular training, DME instructions, Wheelchair mobility training, Cryotherapy, and Moist heat    PLAN FOR NEXT SESSION:  -progress note* - review HEP - standing balance:  - reaching activities   - turning 360 degrees!  - alternating foot taps   - progression to semi-tandem  - fwd/back & side stepping over obstacles - dynamic gait training  - continue treadmill training*  - need to include turning more   - continue figure 8 gait  - continue use of agility ladder  - focus on heel strike  - include scanning of environment     Chiquita Silvan, SPT Physical Therapy Student - [facility]  [facility]  11:11 AM 08/22/24

## 2024-08-29 ENCOUNTER — Ambulatory Visit (INDEPENDENT_AMBULATORY_CARE_PROVIDER_SITE_OTHER): Admitting: Urology

## 2024-08-29 ENCOUNTER — Ambulatory Visit: Attending: Neurology | Admitting: Physical Therapy

## 2024-08-29 VITALS — BP 133/82 | HR 91 | Ht 63.0 in | Wt 150.0 lb

## 2024-08-29 DIAGNOSIS — R262 Difficulty in walking, not elsewhere classified: Secondary | ICD-10-CM | POA: Diagnosis not present

## 2024-08-29 DIAGNOSIS — R32 Unspecified urinary incontinence: Secondary | ICD-10-CM

## 2024-08-29 DIAGNOSIS — M6281 Muscle weakness (generalized): Secondary | ICD-10-CM | POA: Insufficient documentation

## 2024-08-29 DIAGNOSIS — R2681 Unsteadiness on feet: Secondary | ICD-10-CM | POA: Insufficient documentation

## 2024-08-29 DIAGNOSIS — R42 Dizziness and giddiness: Secondary | ICD-10-CM | POA: Insufficient documentation

## 2024-08-29 LAB — URINALYSIS, COMPLETE
Bilirubin, UA: NEGATIVE
Glucose, UA: NEGATIVE
Ketones, UA: NEGATIVE
Leukocytes,UA: NEGATIVE
Nitrite, UA: NEGATIVE
Protein,UA: NEGATIVE
RBC, UA: NEGATIVE
Specific Gravity, UA: 1.01 (ref 1.005–1.030)
Urobilinogen, Ur: 0.2 mg/dL (ref 0.2–1.0)
pH, UA: 7 (ref 5.0–7.5)

## 2024-08-29 LAB — MICROSCOPIC EXAMINATION: Bacteria, UA: NONE SEEN

## 2024-08-29 MED ORDER — LIDOCAINE HCL URETHRAL/MUCOSAL 2 % EX GEL
1.0000 | Freq: Once | CUTANEOUS | Status: AC
  Administered 2024-08-29: 1 via URETHRAL

## 2024-08-29 MED ORDER — SOLIFENACIN SUCCINATE 5 MG PO TABS: 5.0000 mg | ORAL_TABLET | Freq: Every day | ORAL | 11 refills | Status: AC

## 2024-08-29 NOTE — Therapy (Signed)
 OUTPATIENT PHYSICAL THERAPY NEURO TREATMENT/Physical Therapy Progress Note   Dates of reporting period  05/23/24   to   08/29/24      Patient Name: Rebecca Faulkner MRN: [id number] DOB:02-Oct-1941, 83 y.o., female Today's Date: 08/29/2024   PCP: Bernardo Fend, DO REFERRING PROVIDER: Lane Arthea BRAVO, MD  END OF SESSION:   PT End of Session - 08/29/24 0806     Visit Number 20    Number of Visits 61    Date for Recertification  10/24/24    PT Start Time 0803    PT Stop Time 0845    PT Time Calculation (min) 42 min    Equipment Utilized During Treatment Gait belt    Activity Tolerance Patient tolerated treatment well    Behavior During Therapy [facility] for tasks assessed/performed                Past Medical History:  Diagnosis Date   Breast cancer (HCC) 2013   right breast   Heart murmur 1990   Hypercholesteremia    Lump or mass in breast    Malignant neoplasm of upper-outer quadrant of female breast (HCC) 2013   R-breast T1, NO, MO, ER/PR pos., Her 2 neg.   Osteoporosis    Ulcer 1974   Past Surgical History:  Procedure Laterality Date   BREAST SURGERY Right 2013   CATARACT EXTRACTION W/PHACO Right 04/23/2016   Procedure: CATARACT EXTRACTION PHACO AND INTRAOCULAR LENS PLACEMENT (IOC) RIGHT EYE;  Surgeon: Dene Etienne, MD;  Location: [facility];  Service: Ophthalmology;  Laterality: Right;   CATARACT EXTRACTION W/PHACO Left 06/04/2016   Procedure: CATARACT EXTRACTION PHACO AND INTRAOCULAR LENS PLACEMENT (IOC);  Surgeon: Dene Etienne, MD;  Location: [facility];  Service: Ophthalmology;  Laterality: Left;   CESAREAN SECTION  1969   COLONOSCOPY WITH PROPOFOL  N/A 07/22/2017   Procedure: COLONOSCOPY WITH PROPOFOL ;  Surgeon: Dessa Reyes ORN, MD;  Location: [facility];  Service: Endoscopy;  Laterality: N/A;   FRACTURE SURGERY Left 2011   arm, plate and 12 screws   LEG SURGERY Right 01/02/2015   MASTECTOMY Right 2013   Right  inguinal hernia repair  2013   VEIN SURGERY Left 2015   Patient Active Problem List   Diagnosis Date Noted   History of diverticulitis 03/12/2020   Left ovarian cyst 03/12/2020   Atherosclerosis of aorta 03/12/2020   Osteoporosis 08/05/2019   Closed comminuted intertrochanteric fracture of proximal femur (HCC) 07/27/2018   Mass of upper outer quadrant of left breast 06/01/2018   Goals of care, counseling/discussion 01/31/2018   Osteoporosis without current pathological fracture 07/28/2017   Depression, major, recurrent, mild 07/07/2017   B12 deficiency 07/07/2017   Vitamin D  deficiency 07/07/2017   Screening for colon cancer 05/27/2017   Malignant neoplasm of upper-outer quadrant of right breast in female, estrogen receptor positive (HCC) 05/27/2017   Glaucoma 05/26/2017   History of alcoholism (HCC) 05/26/2017   History of hip fracture 05/10/2015   Breast cancer, right (HCC) 05/27/2012   Estrogen receptor positive status (ER+) 05/14/2012    ONSET DATE: 5 years ago  REFERRING DIAG:  R26.89 (ICD-10-CM) - Imbalance  G62.9 (ICD-10-CM) - Neuropathy    THERAPY DIAG:   Dizziness and giddiness  Unsteadiness on feet  Difficulty in walking, not elsewhere classified  Muscle weakness (generalized)  Rationale for Evaluation and Treatment: Rehabilitation  SUBJECTIVE:  SUBJECTIVE STATEMENT:    Pt reports that she has been practicing walking, trying to take big steps. Pt reports overall feeling more confident in her walking.   Pt reports that she is feeling a little lightheaded, got dizzy when closing her eyes this morning in the shower, where she had to grab onto the wall.   Pt reports feeling better, denies dizziness/lightheadedness during session.           Initial Eval: Pt is a  pleasant 83 y/o female presenting to PT for imbalance. Pt ambulating with a SPC. She states she initially thought she had vertigo when she started losing her balance where onset of imbalance was about 5 years ago. Pt noticed imbalance with carrying tea while ambulating; she felt she was going to fall/had someone help her. Pt reports she still gets dizzy off and on. She describes it as weak and sometimes the room moves. When she had an MRI recently she saw the room spin. Pt reports no falls in the last six months, but she has had near-falls where she grabs onto stuff.  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 She has neuropathy affecting bilat LE.  Pt accompanied by: friend Counsellor)  PERTINENT HISTORY: Per chart PMH significant for breast CA (R), hx hip fx, hx alcoholism, depression, B12 deficiency, Vitamin D  deficiency, osteoporosis without current pathological fx, mass of upper quadrant L breast, closed comminuted intertrochanteric fx of proximal femur, atherosclerosis of aorta, new diagnosis of normal pressure hydrocephalus  Neurology MD apt from 03/14/2024: Reviewed results of recent Brain MRI from 01/18/2024 in the office today. Results indicated mild ventriculomegaly with narrowing of the callosal angle. Symptoms indeterminate between normal pressure hydrocephalus and age-related changes at this time. Will continue to monitor at future visits.   Pt states she doesn't want to have a shunt put into her brain.  PAIN:  Are you having pain? General arthritis pain; she reports she takes Advil to help her pain  PRECAUTIONS: Fall, normal pressure hydrocephalus  RED FLAGS: Pt states she wears a diaper due to incontinence and says physician aware   WEIGHT BEARING RESTRICTIONS: No  FALLS: Has patient fallen in last 6 months? No  LIVING ENVIRONMENT: Lives with: lives alone Lives in: House/apartment Stairs: 7 steps to get  into home, bilat rails Has following equipment at home: Single point cane, Grab bars, and grab bar in tub but does not use anymore  PLOF: indep with ADLs at home but is getting some help at work  PATIENT GOALS: she wants to walk without a cane; I don't want to end up in a wheelchair   OBJECTIVE:  Note: Objective measures were completed at Evaluation unless otherwise noted.  DIAGNOSTIC FINDINGS:  EXAM 01/18/2024: MRI HEAD WITHOUT CONTRAST IMPRESSION: 1. Mild ventriculomegaly with narrowing of the callosal angle. Findings can be seen with normal pressure hydrocephalus in the appropriate clinical setting. 2. Chronic small vessel changes. 3. No acute infarct.     Electronically Signed   By: Clem Savory M.D.   On: 02/10/2024 11:19  CT HEAD 10/20/22: FINDINGS: Brain: No acute infarct or hemorrhage. Lateral ventricles and midline structures are stable. No acute extra-axial fluid collections. No mass effect.   Vascular: No hyperdense vessel or unexpected calcification.   Skull: Normal. Negative for fracture or focal lesion.   Sinuses/Orbits: Mild mucoperiosteal thickening throughout the ethmoid air cells. Remaining paranasal sinuses are  clear.   Other: None.   IMPRESSION: 1. Ethmoid sinus disease. 2. No acute intracranial process.     Electronically Signed   By: Ozell Daring M.D.   On: 10/20/2022 16:14  COGNITION: Overall cognitive status: Within functional limits for tasks assessed   SENSATION: Pt has neuropathy affecting bilat LE and RUE; she describes sensation as numbness  COORDINATION: WFL BLE and BUE with rapid alt movement, chin<>target; shin>contrlat LE   EDEMA:  She reports every now and then she gets some swelling in BLE has been ongoing 4 years /not new   POSTURE: rounded shoulders, increased thoracic kyphosis    LOWER EXTREMITY MMT:   Grossly 4/5 bilat LE most deficits found proximal musculature   BED MOBILITY:  Rolling makes pt lightheaded    TRANSFERS: Able to complete hands-free in session with SBA  RAMP:  impaired confidence in balance on ramps per ABC questionnaire    STAIRS:  impaired confidence in balance on stairs per ABC questionnaire   GAIT: See below: decreased gait speed, uses SPC, close CGA without AD, shuffling-like gait/decreased step length and decreased heel strike, limited arm-swing and hip extension bilat, L lateral sway in mid-stance  FUNCTIONAL TESTS:  5 times sit to stand: 13 sec, hands-free  Timed up and go (TUG): 19,5 sec no AD; 14 sec with SPC  10 meter walk test: 0.61 m/s with [facility] Berg Balance Scale: deferred    PATIENT SURVEYS:  ABC scale 58.75%  Vestibular Assessment from 02/22/2024:  OCULOMOTOR EXAM:  Ocular Alignment: normal, some skin drooping around R eye impacting alignment slightly, but relatively symmetrical  Ocular ROM: No Limitations  Spontaneous Nystagmus: absent  Gaze-Induced Nystagmus: absent  Smooth Pursuits: intact  Saccades: slow  Convergence/Divergence: appears normal  Cover/Cross-Cover: WNL  VESTIBULAR - OCULAR REFLEX:   Slow VOR: Normal  VOR Cancellation: Normal, when having pt perform it herself she moved her head more quickly and she said she felt a little lightheadedness  Head-Impulse Test: unable to test due to pt guarding  Dynamic Visual Acuity: to be tested if needed  POSITIONAL TESTING: Right Dix-Hallpike: upbeating, right nystagmus and Duration: <30 seconds Right Sidelying: symptomatic upon return to sitting upright Left Sidelying: no nystagmus and no symptoms  MOTION SENSITIVITY: to be tested if needed  Motion Sensitivity Quotient                                                                                                                              TREATMENT DATE: 08/29/2024   Pt ambulates into therapy clinic using hurricane with short, shuffled gait, and decreased gait speed - noticed L LE step length is shorter than R LE. Supervision  provided for patient safety.  The Activities-Specific Balance Confidence (ABC) Scale 0% 10 20 30  40 50 60 70 80 90 100% No confidence<->completely confident  "How confident are you that you will not lose your balance or become unsteady when you . SABRA SABRA  Date tested 08/29/2024  Walk around the house 100%  2. Walk up or down stairs 100%  3. Bend over and pick up a slipper from in front of a closet floor 90%  4. Reach for a small can off a shelf at eye level 90%  5. Stand on tip toes and reach for something above your head 85%  6. Stand on a chair and reach for something 0%  7. Sweep the floor 100%  8. Walk outside the house to a car parked in the driveway 100%  9. Get into or out of a car 100%  10. Walk across a parking lot to the mall 90%  11. Walk up or down a ramp 80%  12. Walk in a crowded mall where people rapidly walk past you 75%  13. Are bumped into by people as you walk through the mall 85%  14. Step onto or off of an escalator while you are holding onto the railing 0%  15. Step onto or off an escalator while holding onto parcels such that you cannot hold onto the railing 0%  16. Walk outside on icy sidewalks 0%  Total: #/16 68.43%   Five times Sit to Stand Test (FTSS) "Stand up and sit down as quickly as possible 5 times, keeping your arms folded across your chest."    TIME: 8.89 seconds without UE support  Times > 13.6 seconds is associated with increased disability and morbidity (Guralnik, 2000) Times > 15 seconds is predictive of recurrent falls in healthy individuals aged 35 and older (Buatois, et al., 2008) Normal performance values in community dwelling individuals aged 53 and older (Bohannon, 2006): 60-69 years: 11.4 seconds 70-79 years: 12.6 seconds 80-89 years: 14.8 seconds  MCID: >= 2.3 seconds for Vestibular Disorders Ary, 2006)   [facility] PT Assessment - 08/29/24 0001       Berg Balance Test   Sit to Stand Able to stand without using hands and stabilize  independently    Standing Unsupported Able to stand safely 2 minutes    Sitting with Back Unsupported but Feet Supported on Floor or Stool Able to sit safely and securely 2 minutes    Stand to Sit Sits safely with minimal use of hands    Transfers Able to transfer safely, minor use of hands    Standing Unsupported with Eyes Closed Able to stand 10 seconds safely    Standing Unsupported with Feet Together Able to place feet together independently and stand 1 minute safely    From Standing, Reach Forward with Outstretched Arm Can reach confidently >25 cm (10)    From Standing Position, Pick up Object from Floor Able to pick up shoe safely and easily    From Standing Position, Turn to Look Behind Over each Shoulder Looks behind from both sides and weight shifts well    Turn 360 Degrees Able to turn 360 degrees safely but slowly    Standing Unsupported, Alternately Place Feet on Step/Stool Able to complete >2 steps/needs minimal assist    Standing Unsupported, One Foot in Front Needs help to step but can hold 15 seconds    Standing on One Leg Unable to try or needs assist to prevent fall    Total Score 44          10 Meter Walk Test: Patient instructed to walk 10 meters (32.8 ft) as quickly and as safely as possible at their normal speed x2 and at a fast speed x2. Time measured from 2  meter mark to 8 meter mark to accommodate ramp-up and ramp-down.  Normal speed 1: 0.8 m/s (12.51 seconds) Normal speed 2: 0.75 m/s (13.25 seconds) Average Normal speed: 0.78 m/s with use of hurrycane, CGA Fast speed 1: 0.99 m/s (10.12 seconds) Fast speed 2: 0.87 m/s (11.48 seconds) Average Fast speed: 0.93 m/s with use of hurrycane, CGA Cut off scores: <0.4 m/s = household Ambulator, 0.4-0.8 m/s = limited community Ambulator, >0.8 m/s = community Ambulator, >1.2 m/s = crossing a street, <1.0 = increased fall risk MCID 0.05 m/s (small), 0.13 m/s (moderate), 0.06 m/s (significant)  (ANPTA Core Set of Outcome  Measures for Adults with Neurologic Conditions, 2018)  Participated in Timed Up and Go (TUG): 1st trial: 18.25 seconds with hurrycane; pt reporting increased neuropathy symptoms, pt stating that her RLE is not doing what I want it to  2nd trial: 13.8 seconds with hurrycane  Average: 16.03 seconds with hurrycane Patient demonstrates high fall risk as indicated by requiring >13.5seconds to complete the TUG.   Of note, throughout all gait activities this date, noted improvement in step length compared to previous progress note.   Educated pt throughout session on progress with PT, as well as remaining deficits.     PATIENT EDUCATION: Education details: assessment findings, plan, goals Person educated: Patient and pt's friend present for eval  Education method: Explanation Education comprehension: verbalized understanding  HOME EXERCISE PROGRAM:  Access Code: [email or web address] URL: [email or web address] Date: 03/28/2024 Prepared by: Connell Kiss  Exercises - Sit to Stand with Counter Support  - 1 x daily - 7 x weekly - 2 sets - 10 reps - Side Stepping with Counter Support  - 1 x daily - 7 x weekly - 3 sets - 10 reps - Lunge with Counter Support  - 1 x daily - 3-5 x weekly - 2 sets - 10 reps - Narrow Stance with Unilateral Counter Support  - 1 x daily - 7 x weekly - 2 sets - 30 seconds hold - Standing Romberg to 1/2 Tandem Stance  - 1 x daily - 7 x weekly - 2 sets - 30 seconds hold   GOALS: Goals reviewed with patient? Yes  SHORT TERM GOALS: Target date: 09/12/24  Patient will be independent in home exercise program to improve strength/mobility for better functional independence with ADLs. Baseline: initiated on 03/07/2024, updated on 03/28/2024 Goal status: IN PROGRESS   LONG TERM GOALS: Target date: 10/24/2024   Patient will increase ABC scale score >80% to demonstrate better functional mobility and better confidence with ADLs.  Baseline: 58.75% 05/09/2024:  68.125% 08/01/24: 63.75% 08/29/24: 68.43% Goal status: IN PROGRESS  2.  Patient will complete five times sit to stand test in < 10 seconds indicating an increased LE strength and improved balance. Baseline: 13 sec 05/09/2024: 12.08 seconds with arms across chest 08/01/24: 11.06 sec without UE support 08/29/24: 8.89 sec without UE support Goal status: MET  3.  Patient will increase Berg Balance score to 45/56 points to demonstrate decreased fall risk during functional activities Baseline: 02/29/2024: 36/56 05/09/2024: 36/56  08/01/24: 41/56 08/29/24: 44/56 Goal status: REVISED  4.  Patient will increase 10 meter walk test to >1.38m/s as to improve gait speed for better community ambulation and to reduce fall risk. Baseline: 0.61 m/s with [facility] 05/09/2024: Average Normal speed: 0.76 m/s & Average Fast speed: 0.89 m/s using hurricane 05/23/2024: Average Normal speed: 0.9165 m/s & Average Fast speed: 0.90 m/s using hurricane with SBA   08/01/24: Average Normal speed: 0.8 m/s  with cane hovering; Average normal speed: 0.90 m/s with use of cane 08/29/24:  Average Normal speed: 0.78 m/s; Average Fast speed: 0.93 m/s with use of hurrycane, CGA; noted improvement in step length Goal status: IN PROGRESS  5.  Patient will reduce timed up and go to <11 seconds to reduce fall risk and demonstrate improved transfer/gait ability. Baseline: 19.5 sec no AD, 14 sec with [facility] 05/09/2024: 16.36 seconds using hurricane with SBA 08/01/24: 12.73 seconds with use of cane 08/29/24: Average: 16.03 seconds with hurrycane; improvement in step length Goal status: IN PROGRESS    ASSESSMENT:  CLINICAL IMPRESSION:   Patient is a pleasant 83 y.o. female who was seen today for physical therapy progress note & treatment for imbalance and neuropathy. Pt with both subjective & objective reports of improvement; pt stating that she has felt more confident with gait at work & noted improvement in ABC score compared to previous progress  note. Pt meeting goal for 5xSTS, demonstrating improvement in functional strength. Pt also with objective improvement in BERG; revised goal with goal to score 45/56 in order to decrease fall risk. Pt without significant objective change in & TUG; however, noted improvement in step length compared to previous sessions. Ms. Leinweber will benefit from further skilled PT to improve impairments in order to decrease fall risk, and increase QOL and ease and safety with ADLs. Patient's condition has the potential to improve in response to therapy. Maximum improvement is yet to be obtained. The anticipated improvement is attainable and reasonable in a generally predictable time.      OBJECTIVE IMPAIRMENTS: Abnormal gait, decreased activity tolerance, decreased balance, decreased mobility, difficulty walking, decreased ROM, decreased strength, dizziness, impaired sensation, impaired UE functional use, improper body mechanics, postural dysfunction, and pain.   ACTIVITY LIMITATIONS: carrying, lifting, bending, standing, squatting, stairs, transfers, bed mobility, bathing, and locomotion level  PARTICIPATION LIMITATIONS: meal prep, cleaning, shopping, community activity, occupation, and yard work  PERSONAL FACTORS: Age, Fitness, Sex, Time since onset of injury/illness/exacerbation, and 3+ comorbidities: Per chart PMH significant for breast CA (R), hx hip fx, hx alcoholism, depression, B12 deficiency, Vitamin D  deficiency, osteoporosis without current pathological fx, mass of upper quadrant L breast, closed comminuted intertrochanteric fx of proximal femur, atherosclerosis of aorta are also affecting patient's functional outcome.   REHAB POTENTIAL: Good  CLINICAL DECISION MAKING: Evolving/moderate complexity  EVALUATION COMPLEXITY: Moderate  PLAN:  PT FREQUENCY: 1-2x/week  PT DURATION: 12 weeks  PLANNED INTERVENTIONS: [id number]- PT Re-evaluation, 97750- Physical Performance Testing, 97110-Therapeutic  exercises, 97530- Therapeutic activity, [id number]- Neuromuscular re-education, 97535- Self Care, [id number]- Manual therapy, [id number]- Gait training, [phone]- Orthotic Initial, [phone]- Orthotic/Prosthetic subsequent, [phone]- Canalith repositioning, [phone]- Splinting, [phone]- Electrical stimulation (manual), Patient/Family education, Balance training, Stair training, Taping, Joint mobilization, Spinal mobilization, Vestibular training, DME instructions, Wheelchair mobility training, Cryotherapy, and Moist heat    PLAN FOR NEXT SESSION:  - review HEP - standing balance:  - reaching activities   - turning 360 degrees!  - alternating foot taps   - progression to semi-tandem  - fwd/back & side stepping over obstacles - dynamic gait training  - continue treadmill training*  - need to include turning more   - continue figure 8 gait  - continue use of agility ladder  - focus on heel strike  - include scanning of environment     Chiquita Silvan, SPT Physical Therapy Student - [facility]  [facility]  12:15 PM 08/29/24

## 2024-08-29 NOTE — Progress Notes (Signed)
 08/29/2024 10:23 AM   Rebecca Faulkner [birth year] [id number]  Referring provider: Bernardo Fend, DO [address] [place],  [place] [address]  Chief Complaint  Patient presents with   Cysto    HPI: Was consulted to assist the patient's urinary incontinence.  She primarily has urge incontinence.  She does not leak with coughing sneezing.  She has no bedwetting but can have foot on the floor syndrome.  She wears 3-4 pads a day moderately wet.  Oxybutynin is helping a modest amount but causing her to be very thirsty   She voids every 2-3 hours and gets up once at night.  Flow is reasonable and improved as she is holding more on the oxybutynin   No hysterectomy    Patient has an overactive bladder.  She will return on Gemtesa  samples and prescription for pelvic examination and cystoscopy and we will proceed accordingly.  She has urge incontinence for the floor syndrome and milder frequency.  Call if urine culture positive  Today Frequency stable.  Last culture negative Gemtesa  did not help.  She went to oxybutynin once a day and it moderately helps especially during the day but she still has foot on the floor syndrome.  No bedwetting.  She is wearing 2 pads a day.  Taking it once a day she tolerates better.  On pelvic examination she had a narrow introitus.  No stress incontinence after cystoscopy with moderate cough.  No prolapse.  Patient underwent flexible cystoscopy.  Bladder mucosa and trigone were normal.  No cystitis.  No carcinoma.  Urine was positive and sent for culture but clinically not infected     PMH: Past Medical History:  Diagnosis Date   Breast cancer (HCC) 2013   right breast   Heart murmur 1990   Hypercholesteremia    Lump or mass in breast    Malignant neoplasm of upper-outer quadrant of female breast (HCC) 2013   R-breast T1, NO, MO, ER/PR pos., Her 2 neg.   Osteoporosis    Ulcer 1974    Surgical History: Past Surgical History:   Procedure Laterality Date   BREAST SURGERY Right 2013   CATARACT EXTRACTION W/PHACO Right 04/23/2016   Procedure: CATARACT EXTRACTION PHACO AND INTRAOCULAR LENS PLACEMENT (IOC) RIGHT EYE;  Surgeon: Dene Etienne, MD;  Location: [facility];  Service: Ophthalmology;  Laterality: Right;   CATARACT EXTRACTION W/PHACO Left 06/04/2016   Procedure: CATARACT EXTRACTION PHACO AND INTRAOCULAR LENS PLACEMENT (IOC);  Surgeon: Dene Etienne, MD;  Location: [facility];  Service: Ophthalmology;  Laterality: Left;   CESAREAN SECTION  1969   COLONOSCOPY WITH PROPOFOL  N/A 07/22/2017   Procedure: COLONOSCOPY WITH PROPOFOL ;  Surgeon: Dessa Reyes ORN, MD;  Location: [facility];  Service: Endoscopy;  Laterality: N/A;   FRACTURE SURGERY Left 2011   arm, plate and 12 screws   LEG SURGERY Right 01/02/2015   MASTECTOMY Right 2013   Right inguinal hernia repair  2013   VEIN SURGERY Left 2015    Home Medications:  Allergies as of 08/29/2024       Reactions   Codeine Nausea Only   Pravastatin     Constipation         Medication List        Accurate as of August 29, 2024 10:23 AM. If you have any questions, ask your nurse or doctor.          STOP taking these medications    Gemtesa  75 MG Tabs Generic drug: Vibegron   Stopped by: Rebecca Faulkner       TAKE these medications    cholecalciferol 25 MCG (1000 UNIT) tablet Commonly known as: VITAMIN D3 Take 1,000 Units by mouth daily.   gabapentin 100 MG capsule Commonly known as: NEURONTIN Take 100 mg in the morning and 200 mg at night.   Magnesium Glycinate 100 MG Caps Take 200 mg by mouth.   multivitamin with minerals tablet Take 1 tablet by mouth daily.   oxybutynin 5 MG tablet Commonly known as: DITROPAN Take 5 mg by mouth 2 (two) times daily.   promethazine -dextromethorphan 6.25-15 MG/5ML syrup Commonly known as: PROMETHAZINE -DM Take 2.5-5 mLs by mouth 2 (two) times daily as needed for  cough.   simvastatin  40 MG tablet Commonly known as: ZOCOR  Take 1 tablet (40 mg total) by mouth at bedtime.        Allergies:  Allergies  Allergen Reactions   Codeine Nausea Only   Pravastatin      Constipation     Family History: Family History  Problem Relation Age of Onset   Dementia Mother    Stroke Father    Diabetes Brother    Breast cancer Neg Hx     Social History:  reports that she has never smoked. She has never used smokeless tobacco. She reports that she does not drink alcohol and does not use drugs.  ROS:                                        Physical Exam: BP 133/82 (BP Location: Left Arm, Patient Position: Sitting, Cuff Size: Normal)   Pulse 91   Ht 5' 3 (1.6 m)   Wt 68 kg   SpO2 97%   BMI 26.57 kg/m   Constitutional:  Alert and oriented, No acute distress. HEENT: [place] AT, moist mucus membranes.  Trachea midline, no masses.   Laboratory Data: Lab Results  Component Value Date   WBC 12.3 (H) 11/13/2023   HGB 15.4 (H) 11/13/2023   HCT 46.4 (H) 11/13/2023   MCV 90.6 11/13/2023   PLT 248 11/13/2023    Lab Results  Component Value Date   CREATININE 0.75 11/13/2023    No results found for: PSA  No results found for: TESTOSTERONE  Lab Results  Component Value Date   HGBA1C 5.8 (H) 11/10/2022    Urinalysis    Component Value Date/Time   COLORURINE YELLOW (A) 11/13/2023 0745   APPEARANCEUR Clear 06/06/2024 1013   LABSPEC 1.027 11/13/2023 0745   LABSPEC 1.014 11/10/2013 1205   PHURINE 5.0 11/13/2023 0745   GLUCOSEU Negative 06/06/2024 1013   GLUCOSEU Negative 11/10/2013 1205   HGBUR SMALL (A) 11/13/2023 0745   BILIRUBINUR Negative 06/06/2024 1013   BILIRUBINUR Negative 11/10/2013 1205   KETONESUR NEGATIVE 11/13/2023 0745   PROTEINUR Negative 06/06/2024 1013   PROTEINUR 30 (A) 11/13/2023 0745   NITRITE Negative 06/06/2024 1013   NITRITE NEGATIVE 11/13/2023 0745   LEUKOCYTESUR Negative 06/06/2024 1013    LEUKOCYTESUR NEGATIVE 11/13/2023 0745   LEUKOCYTESUR Negative 11/10/2013 1205    Pertinent Imaging: Urine reviewed and sent for culture.  Chart reviewed  Assessment & Plan: I like to try the patient on Vesicare 5 mg 30 x 11.  She is a partial responder to oxybutynin.  I will have her come back and we will talk about PTNS.  I am not convinced that InterStim or Botox would be  in her best interest.  I am not going to order urodynamics.  I will consider the ankle implant and this will be discussed as well.  1. Urinary incontinence, unspecified type (Primary)  - Urinalysis, Complete - lidocaine  (XYLOCAINE ) 2 % jelly 1 Application   No follow-ups on file.  Rebecca DELENA Elizabeth, MD  [facility] [address] [place], [place] [address] [phone]

## 2024-09-02 LAB — CULTURE, URINE COMPREHENSIVE

## 2024-09-05 ENCOUNTER — Ambulatory Visit: Admitting: Podiatry

## 2024-09-12 ENCOUNTER — Other Ambulatory Visit: Payer: Self-pay

## 2024-09-12 ENCOUNTER — Other Ambulatory Visit: Payer: Self-pay | Admitting: Internal Medicine

## 2024-09-12 ENCOUNTER — Encounter: Payer: Self-pay | Admitting: Internal Medicine

## 2024-09-12 ENCOUNTER — Ambulatory Visit (INDEPENDENT_AMBULATORY_CARE_PROVIDER_SITE_OTHER): Admitting: Internal Medicine

## 2024-09-12 VITALS — BP 118/72 | HR 96 | Temp 97.7°F | Resp 16 | Ht 63.0 in | Wt 146.9 lb

## 2024-09-12 DIAGNOSIS — N6489 Other specified disorders of breast: Secondary | ICD-10-CM

## 2024-09-12 DIAGNOSIS — E782 Mixed hyperlipidemia: Secondary | ICD-10-CM | POA: Diagnosis not present

## 2024-09-12 DIAGNOSIS — R002 Palpitations: Secondary | ICD-10-CM

## 2024-09-12 DIAGNOSIS — Z23 Encounter for immunization: Secondary | ICD-10-CM | POA: Diagnosis not present

## 2024-09-12 DIAGNOSIS — Z87898 Personal history of other specified conditions: Secondary | ICD-10-CM

## 2024-09-12 DIAGNOSIS — G629 Polyneuropathy, unspecified: Secondary | ICD-10-CM | POA: Diagnosis not present

## 2024-09-12 DIAGNOSIS — I700 Atherosclerosis of aorta: Secondary | ICD-10-CM

## 2024-09-12 DIAGNOSIS — Z1231 Encounter for screening mammogram for malignant neoplasm of breast: Secondary | ICD-10-CM

## 2024-09-12 DIAGNOSIS — E559 Vitamin D deficiency, unspecified: Secondary | ICD-10-CM

## 2024-09-12 MED ORDER — SIMVASTATIN 40 MG PO TABS: 40.0000 mg | ORAL_TABLET | Freq: Every day | ORAL | 1 refills | Status: AC

## 2024-09-12 NOTE — Progress Notes (Signed)
 Established Patient Office Visit  Subjective:     Patient ID: Rebecca Faulkner, female    DOB: [birth year], 83 y.o.   MRN: [id number]  Chief Complaint  Patient presents with   Medical Management of Chronic Issues    6 month recheck    Hyperlipidemia Pertinent negatives include no chest pain or shortness of breath.   Patient is in today for follow up on chronic medical conditions.  She is here with her friend today.   Discussed the use of AI scribe software for clinical note transcription with the patient, who gave verbal consent to proceed.  History of Present Illness Rebecca Faulkner is an 83 year old female who presents for follow-up and medication management.  She recently started VESIcare and is unsure of its effects. She continues gabapentin, usually filled by Dr. Lane. She experiences occasional palpitations, described as feeling like her heart is skipping a beat, which are brief and resolve quickly. She denies dizziness and shortness of breath with these episodes. Her last EKG was in December 2023.  She underwent a mastectomy in 2013 for breast cancer, followed by hormonal therapy, and wishes to continue screening.  She has a family history of diabetes and was previously told she might be prediabetic, but her last A1c a year ago was normal at 5.5. She consumes a lot of chocolate and sugary foods.   Neuropathy: -CT of the head was obtained on 04/21/2022, which was negative for acute abnormalities.   -Laboratory work-up at the time of symptom onset significant only for low vitamin B1 levels, she is currently on a B complex supplement.   -She cannot undergo an MRI due to metal plates in her left humerus and right femur.   -EMG on 09/22/22 showing chronic severe polyneuropathy.   -Currently on gabapentin 200 mg nightly but feels like neuropathy symptoms are worse and affecting her at her job -She is following with neurology at [facility] clinic -MRI head 4/25 showing  mild ventriculometry, concerns for potential normal pressure hydrocephalus, chronic small vessel disease.  -Currently taking Gabapentin 100 mg in the morning and 200 mg in the evening   HLD: -Medications: Zocor  40 mg -Patient is compliant with above medications and reports no side effects. Doing well.  -Last lipid panel: Lipid Panel     Component Value Date/Time   CHOL 209 (H) 11/10/2022 1034   TRIG 94 11/10/2022 1034   HDL 64 11/10/2022 1034   CHOLHDL 3.3 11/10/2022 1034   LDLCALC 125 (H) 11/10/2022 1034   The ASCVD Risk score (Arnett DK, et al., 2019) failed to calculate for the following reasons:   The 2019 ASCVD risk score is only valid for ages 81 to 94  Hx of Pre-Diabetes: -Last A1c 9/24 5.5% -Not currently on any medication, does eat chocolate and sweets frequently   Health maintenance: -Blood work due -Colon cancer screening: Colonoscopy 2018 -Wants to resume breast cancer screening - diagnostic left mammogram ordered -Discussed RSV vaccine, info printed for her to review   Review of Systems  Respiratory:  Negative for shortness of breath.   Cardiovascular:  Positive for palpitations. Negative for chest pain.        Objective:    BP 118/72 (Cuff Size: Normal)   Pulse 96   Temp 97.7 F (36.5 C) (Oral)   Resp 16   Ht 5' 3 (1.6 m)   Wt 146 lb 14.4 oz (66.6 kg)   SpO2 97%   BMI 26.02 kg/m  BP  Readings from Last 3 Encounters:  09/12/24 118/72  08/29/24 133/82  06/06/24 123/79   Wt Readings from Last 3 Encounters:  09/12/24 146 lb 14.4 oz (66.6 kg)  08/29/24 150 lb (68 kg)  06/06/24 147 lb 9.6 oz (67 kg)      Physical Exam Constitutional:      Appearance: Normal appearance.  HENT:     Head: Normocephalic and atraumatic.  Eyes:     Conjunctiva/sclera: Conjunctivae normal.  Cardiovascular:     Rate and Rhythm: Normal rate and regular rhythm.     Heart sounds: Normal heart sounds.  Pulmonary:     Effort: Pulmonary effort is normal.     Breath  sounds: Normal breath sounds.  Feet:     Right foot:     Skin integrity: Skin integrity normal.     Left foot:     Skin integrity: Skin integrity normal.  Skin:    General: Skin is warm and dry.  Neurological:     General: No focal deficit present.     Mental Status: She is alert. Mental status is at baseline.  Psychiatric:        Mood and Affect: Mood normal.        Behavior: Behavior normal.     No results found for any visits on 09/12/24.      Assessment & Plan:   Assessment & Plan  Mixed hyperlipidemia Requires monitoring and management. - Ordered fasting lipid panel. - Refilled cholesterol medication.  History of breast cancer, status post right mastectomy Breast cancer with right mastectomy performed approximately nine years ago. Screening was stopped at age 51, but she prefers to continue screening due to personal preference and desire to know about recurrence. - Ordered diagnostic left mammogram. - Provided card to schedule mammogram.  Urinary symptoms, under evaluation by urologist Urinary symptoms under evaluation by urologist. Recently started on VESIcare (solifenacin) as per urologist's recommendation. No current assessment of efficacy due to recent initiation. - Continue VESIcare as prescribed by urologist.  Neuropathy/Cognitive function Cognitive function well-managed. Recent mini mental exam was normal. Continues on gabapentin as prescribed by neurologist. - Continue gabapentin as prescribed by neurologist.  Cardiac palpitations, under evaluation Intermittent cardiac palpitations described as fleeting and not sustained. Last EKG was in 2023. Differential includes PVCs or AFib. No current symptoms of AFib. Discussed potential use of Zio patch for further evaluation if symptoms persist or worsen. - Performed EKG which showed normal sinus rhythm. - Will consider Zio patch if symptoms persist or worsen.  General Health Maintenance Discussed RSV vaccine,  which is new and recommended for those 75 and older. Explained that it prevents severe illness, hospitalizations, and death, similar to COVID and pneumonia vaccines. She recently received flu vaccine. - Provided information on RSV vaccine. - Recommended obtaining RSV vaccine at pharmacy.  - Flu vaccine HIGH DOSE PF(Fluzone Trivalent) - MM 3D DIAGNOSTIC MAMMOGRAM UNILATERAL LEFT BREAST; Future - CBC w/Diff/Platelet - Comprehensive Metabolic Panel (CMET) - TSH - EKG 12-Lead - Lipid Profile - simvastatin  (ZOCOR ) 40 MG tablet; Take 1 tablet (40 mg total) by mouth at bedtime.  Dispense: 90 tablet; Refill: 1 - Vitamin D  (25 hydroxy) - HgB A1c   Return in about 6 months (around 03/12/2025).  Sharyle Fischer, DO

## 2024-09-12 NOTE — Patient Instructions (Addendum)
 It was great seeing you today!  Plan discussed at today's visit: -Blood work ordered today, results will be uploaded to MyChart. Please return any weekday 8-11:30 and 1:30-3:30. Please be fasting at least 8-12 hours.  -Medication refilled -Mammogram ordered, please call to schedule -Consider RSV vaccine, more info below -EKG today  Follow up in: 6 months   Take care and let us  know if you have any questions or concerns prior to your next visit.  Dr. Bernardo  Respiratory Syncytial Virus (RSV) Vaccine Injection What is this medication? RESPIRATORY SYNCYTIAL VIRUS VACCINE (reh SPIR uh tor ee sin SISH uhl VY rus vak SEEN) reduces the risk of respiratory syncytial virus (RSV). It does not treat RSV. It is still possible to get RSV after receiving this vaccine, but the symptoms may be less severe or not last as long. It works by helping your immune system learn how to fight off a future infection. This medicine may be used for other purposes; ask your health care provider or pharmacist if you have questions. COMMON BRAND NAME(S): ABRYSVO, AREXVY, mRESVIA What should I tell my care team before I take this medication? They need to know if you have any of these conditions: Immune system problems An unusual or allergic reaction to respiratory syncytial virus vaccine, other medications, foods, dyes, or preservatives Pregnant or trying to get pregnant Breastfeeding How should I use this medication? This vaccine is injected into a muscle. It is given by your care team. A copy of Vaccine Information Statements will be given before each vaccination. Be sure to read this information carefully each time. This sheet may change often. A copy of Vaccine Information Statements will be given before each vaccination. Be sure to read this information carefully each time. This sheet may change often. Talk to your care team about the use of this vaccine in children. It is not approved for use in  children. Overdosage: If you think you have taken too much of this medicine contact a poison control center or emergency room at once. NOTE: This medicine is only for you. Do not share this medicine with others. What if I miss a dose? This does not apply. What may interact with this medication? Medications that lower your chance of fighting infection This list may not describe all possible interactions. Give your health care provider a list of all the medicines, herbs, non-prescription drugs, or dietary supplements you use. Also tell them if you smoke, drink alcohol, or use illegal drugs. Some items may interact with your medicine. What should I watch for while using this medication? Visit your care team for regular health checks. Before you receive this vaccine, talk to your care team if you have an acute illness. Vaccines can be given to people with mild acute illness, such as the common cold or diarrhea. Discuss with your care team the risks and benefits of receiving this vaccine during a moderate to severe illness. Your care team may choose to wait to give you the vaccine when you feel better. Report any side effects to your care team or to the Vaccine Adverse Event Reporting System (VAERS) website at https://vaers.lagents.no. This is only for reporting side effects; VAERs staff do not give medical advice. What side effects may I notice from receiving this medication? Side effects that you should report to your care team as soon as possible: Allergic reactions--skin rash, itching, hives, swelling of the face, lips, tongue, or throat Feeling faint or lightheaded Weakness and tingling in the feet  and legs that spreads to upper body Side effects that usually do not require medical attention (report these to your care team if they continue or are bothersome): General discomfort and fatigue Headache Muscle pain Pain, redness, or irritation at injection site This list may not describe all possible  side effects. Call your doctor for medical advice about side effects. You may report side effects to FDA at 1-800-FDA-1088. Where should I keep my medication? This vaccine is only given by your care team. It will not be stored at home. NOTE: This sheet is a summary. It may not cover all possible information. If you have questions about this medicine, talk to your doctor, pharmacist, or health care provider.  2025 Elsevier/Gold Standard (2023-11-04 00:00:00)

## 2024-09-13 ENCOUNTER — Ambulatory Visit: Payer: Self-pay | Admitting: Internal Medicine

## 2024-09-13 LAB — LIPID PANEL
Cholesterol: 227 mg/dL — ABNORMAL HIGH (ref <200)
HDL: 77 mg/dL (ref >=50)
LDL Cholesterol (Calc): 127 mg/dL — ABNORMAL HIGH
Non-HDL Cholesterol (Calc): 150 mg/dL — ABNORMAL HIGH (ref <130)
Total CHOL/HDL Ratio: 2.9 (calc) (ref <5.0)
Triglycerides: 122 mg/dL (ref <150)

## 2024-09-13 LAB — CBC WITH DIFFERENTIAL/PLATELET
Absolute Lymphocytes: 1333 {cells}/uL (ref 850–3900)
Absolute Monocytes: 710 {cells}/uL (ref 200–950)
Basophils Absolute: 47 {cells}/uL (ref 0–200)
Basophils Relative: 0.7 %
Eosinophils Absolute: 127 {cells}/uL (ref 15–500)
Eosinophils Relative: 1.9 %
HCT: 44.5 % (ref 35.0–45.0)
Hemoglobin: 15 g/dL (ref 11.7–15.5)
MCH: 30.5 pg (ref 27.0–33.0)
MCHC: 33.7 g/dL (ref 32.0–36.0)
MCV: 90.4 fL (ref 80.0–100.0)
MPV: 10.7 fL (ref 7.5–12.5)
Monocytes Relative: 10.6 %
Neutro Abs: 4482 {cells}/uL (ref 1500–7800)
Neutrophils Relative %: 66.9 %
Platelets: 291 Thousand/uL (ref 140–400)
RBC: 4.92 Million/uL (ref 3.80–5.10)
RDW: 12.2 % (ref 11.0–15.0)
Total Lymphocyte: 19.9 %
WBC: 6.7 Thousand/uL (ref 3.8–10.8)

## 2024-09-13 LAB — COMPREHENSIVE METABOLIC PANEL WITH GFR
AG Ratio: 1.9 (calc) (ref 1.0–2.5)
ALT: 14 U/L (ref 6–29)
AST: 19 U/L (ref 10–35)
Albumin: 4.8 g/dL (ref 3.6–5.1)
Alkaline phosphatase (APISO): 56 U/L (ref 37–153)
BUN: 11 mg/dL (ref 7–25)
CO2: 22 mmol/L (ref 20–32)
Calcium: 9.9 mg/dL (ref 8.6–10.4)
Chloride: 107 mmol/L (ref 98–110)
Creat: 0.65 mg/dL (ref 0.60–0.95)
Globulin: 2.5 g/dL (ref 1.9–3.7)
Glucose, Bld: 96 mg/dL (ref 65–99)
Potassium: 4.7 mmol/L (ref 3.5–5.3)
Sodium: 144 mmol/L (ref 135–146)
Total Bilirubin: 0.4 mg/dL (ref 0.2–1.2)
Total Protein: 7.3 g/dL (ref 6.1–8.1)
eGFR: 88 mL/min/1.73m2 (ref >=60)

## 2024-09-13 LAB — HEMOGLOBIN A1C
Hgb A1c MFr Bld: 5.6 % (ref <5.7)
Mean Plasma Glucose: 114 mg/dL
eAG (mmol/L): 6.3 mmol/L

## 2024-09-13 LAB — TSH: TSH: 1.56 m[IU]/L (ref 0.40–4.50)

## 2024-09-13 LAB — VITAMIN D 25 HYDROXY (VIT D DEFICIENCY, FRACTURES): Vit D, 25-Hydroxy: 37 ng/mL (ref 30–100)

## 2024-09-19 ENCOUNTER — Ambulatory Visit: Admitting: Podiatry

## 2024-09-19 DIAGNOSIS — E538 Deficiency of other specified B group vitamins: Secondary | ICD-10-CM

## 2024-09-19 DIAGNOSIS — M79675 Pain in left toe(s): Secondary | ICD-10-CM

## 2024-09-19 DIAGNOSIS — B351 Tinea unguium: Secondary | ICD-10-CM

## 2024-09-19 DIAGNOSIS — G63 Polyneuropathy in diseases classified elsewhere: Secondary | ICD-10-CM

## 2024-09-19 DIAGNOSIS — M79674 Pain in right toe(s): Secondary | ICD-10-CM

## 2024-09-19 DIAGNOSIS — L84 Corns and callosities: Secondary | ICD-10-CM | POA: Diagnosis not present

## 2024-09-21 ENCOUNTER — Encounter: Payer: Self-pay | Admitting: Podiatry

## 2024-09-21 NOTE — Progress Notes (Signed)
  Subjective:  Patient ID: Rebecca Faulkner, female    DOB: [birth year],  MRN: [id number]  Chief Complaint  Patient presents with   Nail Problem    Thick painful toenails, 4 month follow up     83 y.o. female presents with the above complaint. History confirmed with patient.  Her nails are thickened and elongated again.  She also has painful corns on the outside of the fifth toes that are painful, previous debridement has been helpful  Objective:  Physical Exam: warm, good capillary refill, no trophic changes or ulcerative lesions, normal DP and PT pulses, and decreased peripheral sensory exam.  Bilateral fifth toe corn Left Foot: dystrophic yellowed discolored nail plates with subungual debris Right Foot: dystrophic yellowed discolored nail plates with subungual debris  Assessment:   1. Pain due to onychomycosis of toenails of both feet   2. Callus of foot   3. Vitamin B12 deficiency neuropathy      Plan:  Patient was evaluated and treated and all questions answered.  Discussed the etiology and treatment options for the condition in detail with the patient. Recommended debridement of the nails today. Sharp and mechanical debridement performed of all painful and mycotic nails today. Nails debrided in length and thickness using a nail nipper to level of comfort. Discussed treatment options including appropriate shoe gear.   Continue regular intermittent debridement has been helpful in reducing pain and improving function.   All symptomatic hyperkeratoses were safely debrided with a sterile #15 blade to patient's level of comfort without incident.  This will reduce pain and improve function.  She does have a history of polyneuropathy from B12 deficiency that requires professional care.     Return in about 4 months (around 01/17/2025) for RFC.

## 2024-09-26 ENCOUNTER — Ambulatory Visit: Attending: Neurology | Admitting: Physical Therapy

## 2024-09-26 ENCOUNTER — Ambulatory Visit
Admission: RE | Admit: 2024-09-26 | Discharge: 2024-09-26 | Disposition: A | Source: Ambulatory Visit | Attending: Internal Medicine

## 2024-09-26 ENCOUNTER — Other Ambulatory Visit: Payer: Self-pay | Admitting: Internal Medicine

## 2024-09-26 DIAGNOSIS — R928 Other abnormal and inconclusive findings on diagnostic imaging of breast: Secondary | ICD-10-CM

## 2024-09-26 DIAGNOSIS — R2681 Unsteadiness on feet: Secondary | ICD-10-CM | POA: Insufficient documentation

## 2024-09-26 DIAGNOSIS — N6489 Other specified disorders of breast: Secondary | ICD-10-CM | POA: Insufficient documentation

## 2024-09-26 DIAGNOSIS — R92322 Mammographic fibroglandular density, left breast: Secondary | ICD-10-CM | POA: Diagnosis not present

## 2024-09-26 DIAGNOSIS — R42 Dizziness and giddiness: Secondary | ICD-10-CM | POA: Diagnosis present

## 2024-09-26 DIAGNOSIS — R262 Difficulty in walking, not elsewhere classified: Secondary | ICD-10-CM | POA: Diagnosis present

## 2024-09-26 DIAGNOSIS — M6281 Muscle weakness (generalized): Secondary | ICD-10-CM | POA: Diagnosis not present

## 2024-09-26 DIAGNOSIS — Z1231 Encounter for screening mammogram for malignant neoplasm of breast: Secondary | ICD-10-CM | POA: Insufficient documentation

## 2024-09-26 DIAGNOSIS — R921 Mammographic calcification found on diagnostic imaging of breast: Secondary | ICD-10-CM | POA: Diagnosis not present

## 2024-09-26 NOTE — Therapy (Signed)
 OUTPATIENT PHYSICAL THERAPY NEURO TREATMENT  Patient Name: Rebecca Faulkner MRN: [id number] DOB:1940-11-24, 83 y.o., female Today's Date: 09/26/2024   PCP: Bernardo Fend, DO REFERRING PROVIDER: Lane Arthea BRAVO, MD  END OF SESSION:   PT End of Session - 09/26/24 0805     Visit Number 21    Number of Visits 61    Date for Recertification  10/24/24    PT Start Time 0805    PT Stop Time 0845    PT Time Calculation (min) 40 min    Equipment Utilized During Treatment Gait belt    Activity Tolerance Patient tolerated treatment well    Behavior During Therapy [facility] for tasks assessed/performed                Past Medical History:  Diagnosis Date   Breast cancer (HCC) 2013   right breast   Heart murmur 1990   Hypercholesteremia    Lump or mass in breast    Malignant neoplasm of upper-outer quadrant of female breast (HCC) 2013   R-breast T1, NO, MO, ER/PR pos., Her 2 neg.   Osteoporosis    Ulcer 1974   Past Surgical History:  Procedure Laterality Date   BREAST SURGERY Right 2013   CATARACT EXTRACTION W/PHACO Right 04/23/2016   Procedure: CATARACT EXTRACTION PHACO AND INTRAOCULAR LENS PLACEMENT (IOC) RIGHT EYE;  Surgeon: Dene Etienne, MD;  Location: [facility];  Service: Ophthalmology;  Laterality: Right;   CATARACT EXTRACTION W/PHACO Left 06/04/2016   Procedure: CATARACT EXTRACTION PHACO AND INTRAOCULAR LENS PLACEMENT (IOC);  Surgeon: Dene Etienne, MD;  Location: [facility];  Service: Ophthalmology;  Laterality: Left;   CESAREAN SECTION  1969   COLONOSCOPY WITH PROPOFOL  N/A 07/22/2017   Procedure: COLONOSCOPY WITH PROPOFOL ;  Surgeon: Dessa Reyes ORN, MD;  Location: [facility];  Service: Endoscopy;  Laterality: N/A;   FRACTURE SURGERY Left 2011   arm, plate and 12 screws   LEG SURGERY Right 01/02/2015   MASTECTOMY Right 2013   Right inguinal hernia repair  2013   VEIN SURGERY Left 2015   Patient Active Problem List   Diagnosis  Date Noted   History of diverticulitis 03/12/2020   Left ovarian cyst 03/12/2020   Atherosclerosis of aorta 03/12/2020   Osteoporosis 08/05/2019   Closed comminuted intertrochanteric fracture of proximal femur (HCC) 07/27/2018   Mass of upper outer quadrant of left breast 06/01/2018   Goals of care, counseling/discussion 01/31/2018   Osteoporosis without current pathological fracture 07/28/2017   Depression, major, recurrent, mild 07/07/2017   B12 deficiency 07/07/2017   Vitamin D  deficiency 07/07/2017   Screening for colon cancer 05/27/2017   Malignant neoplasm of upper-outer quadrant of right breast in female, estrogen receptor positive (HCC) 05/27/2017   Glaucoma 05/26/2017   History of alcoholism (HCC) 05/26/2017   History of hip fracture 05/10/2015   Breast cancer, right (HCC) 05/27/2012   Estrogen receptor positive status (ER+) 05/14/2012    ONSET DATE: 5 years ago  REFERRING DIAG:  R26.89 (ICD-10-CM) - Imbalance  G62.9 (ICD-10-CM) - Neuropathy    THERAPY DIAG:   No diagnosis found.  Rationale for Evaluation and Treatment: Rehabilitation  SUBJECTIVE:  SUBJECTIVE STATEMENT:    Pt emphasizing having treatment sessions with the same therapist consistently. Pt reports she has not felt too great since last therapy session, but is ready to get back to work. Pt reports more difficulty with physical activity/balance while in the cold weather.   Initial Eval: Pt is a pleasant 82 y/o female presenting to PT for imbalance. Pt ambulating with a SPC. She states she initially thought she had vertigo when she started losing her balance where onset of imbalance was about 5 years ago. Pt noticed imbalance with carrying tea while ambulating; she felt she was going to fall/had someone help her. Pt reports  she still gets dizzy off and on. She describes it as weak and sometimes the room moves. When she had an MRI recently she saw the room spin. Pt reports no falls in the last six months, but she has had near-falls where she grabs onto stuff.  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 She has neuropathy affecting bilat LE.  Pt accompanied by: friend Counsellor)  PERTINENT HISTORY: Per chart PMH significant for breast CA (R), hx hip fx, hx alcoholism, depression, B12 deficiency, Vitamin D  deficiency, osteoporosis without current pathological fx, mass of upper quadrant L breast, closed comminuted intertrochanteric fx of proximal femur, atherosclerosis of aorta, new diagnosis of normal pressure hydrocephalus  Neurology MD apt from 03/14/2024: Reviewed results of recent Brain MRI from 01/18/2024 in the office today. Results indicated mild ventriculomegaly with narrowing of the callosal angle. Symptoms indeterminate between normal pressure hydrocephalus and age-related changes at this time. Will continue to monitor at future visits.   Pt states she doesn't want to have a shunt put into her brain.  PAIN:  Are you having pain? General arthritis pain; she reports she takes Advil to help her pain  PRECAUTIONS: Fall, normal pressure hydrocephalus  RED FLAGS: Pt states she wears a diaper due to incontinence and says physician aware   WEIGHT BEARING RESTRICTIONS: No  FALLS: Has patient fallen in last 6 months? No  LIVING ENVIRONMENT: Lives with: lives alone Lives in: House/apartment Stairs: 7 steps to get into home, bilat rails Has following equipment at home: Single point cane, Grab bars, and grab bar in tub but does not use anymore  PLOF: indep with ADLs at home but is getting some help at work  PATIENT GOALS: she wants to walk without a cane; I don't want to end up in a wheelchair   OBJECTIVE:  Note: Objective measures were  completed at Evaluation unless otherwise noted.  DIAGNOSTIC FINDINGS:  EXAM 01/18/2024: MRI HEAD WITHOUT CONTRAST IMPRESSION: 1. Mild ventriculomegaly with narrowing of the callosal angle. Findings can be seen with normal pressure hydrocephalus in the appropriate clinical setting. 2. Chronic small vessel changes. 3. No acute infarct.     Electronically Signed   By: Clem Savory M.D.   On: 02/10/2024 11:19  CT HEAD 10/20/22: FINDINGS: Brain: No acute infarct or hemorrhage. Lateral ventricles and midline structures are stable. No acute extra-axial fluid collections. No mass effect.   Vascular: No hyperdense vessel or unexpected calcification.   Skull: Normal. Negative for fracture or focal lesion.   Sinuses/Orbits: Mild mucoperiosteal thickening throughout the ethmoid air cells. Remaining paranasal sinuses are clear.   Other: None.   IMPRESSION: 1. Ethmoid sinus disease. 2. No acute intracranial process.     Electronically Signed   By: Ozell  Delores M.D.   On: 10/20/2022 16:14  COGNITION: Overall cognitive status: Within functional limits for tasks assessed   SENSATION: Pt has neuropathy affecting bilat LE and RUE; she describes sensation as numbness  COORDINATION: WFL BLE and BUE with rapid alt movement, chin<>target; shin>contrlat LE   EDEMA:  She reports every now and then she gets some swelling in BLE has been ongoing 4 years /not new   POSTURE: rounded shoulders, increased thoracic kyphosis    LOWER EXTREMITY MMT:   Grossly 4/5 bilat LE most deficits found proximal musculature   BED MOBILITY:  Rolling makes pt lightheaded   TRANSFERS: Able to complete hands-free in session with SBA  RAMP:  impaired confidence in balance on ramps per ABC questionnaire    STAIRS:  impaired confidence in balance on stairs per ABC questionnaire   GAIT: See below: decreased gait speed, uses SPC, close CGA without AD, shuffling-like gait/decreased step  length and decreased heel strike, limited arm-swing and hip extension bilat, L lateral sway in mid-stance  FUNCTIONAL TESTS:  5 times sit to stand: 13 sec, hands-free  Timed up and go (TUG): 19,5 sec no AD; 14 sec with SPC  10 meter walk test: 0.61 m/s with [facility] Berg Balance Scale: deferred    PATIENT SURVEYS:  ABC scale 58.75%  Vestibular Assessment from 02/22/2024:  OCULOMOTOR EXAM:  Ocular Alignment: normal, some skin drooping around R eye impacting alignment slightly, but relatively symmetrical  Ocular ROM: No Limitations  Spontaneous Nystagmus: absent  Gaze-Induced Nystagmus: absent  Smooth Pursuits: intact  Saccades: slow  Convergence/Divergence: appears normal  Cover/Cross-Cover: WNL  VESTIBULAR - OCULAR REFLEX:   Slow VOR: Normal  VOR Cancellation: Normal, when having pt perform it herself she moved her head more quickly and she said she felt a little lightheadedness  Head-Impulse Test: unable to test due to pt guarding  Dynamic Visual Acuity: to be tested if needed  POSITIONAL TESTING: Right Dix-Hallpike: upbeating, right nystagmus and Duration: <30 seconds Right Sidelying: symptomatic upon return to sitting upright Left Sidelying: no nystagmus and no symptoms  MOTION SENSITIVITY: to be tested if needed  Motion Sensitivity Quotient                                                                                                                              TREATMENT DATE: 09/26/2024   231 ft ambulation with pivot turns in hallway sit<>stand in chair, no UE support x8 sit<>stand in chair, 5# DB held at chest, x8 ambulation with cone weaving, 4 cones placed about 42ft apart, 3 laps agility ladder, 1 lap walking on side for visual of increased step length encouragement of reciprocal gait pattern Standing alternating taps onto first step of stair case, x10 ea Step ups onto first step of stair case, 2x10 ea agility ladder, 3 laps, inside ladder, reciprocal gait  pattern agility ladder, 2 laps, fwd/bwd/sidestep pattern in/out of ladder  *CGA-min assist used in today's session unless otherwise  stated*  PATIENT EDUCATION: Education details: assessment findings, plan, goals Person educated: Patient and pt's friend present for eval  Education method: Explanation Education comprehension: verbalized understanding  HOME EXERCISE PROGRAM:  Access Code: [email or web address] URL: [email or web address] Date: 03/28/2024 Prepared by: Connell Kiss  Exercises - Sit to Stand with Counter Support  - 1 x daily - 7 x weekly - 2 sets - 10 reps - Side Stepping with Counter Support  - 1 x daily - 7 x weekly - 3 sets - 10 reps - Lunge with Counter Support  - 1 x daily - 3-5 x weekly - 2 sets - 10 reps - Narrow Stance with Unilateral Counter Support  - 1 x daily - 7 x weekly - 2 sets - 30 seconds hold - Standing Romberg to 1/2 Tandem Stance  - 1 x daily - 7 x weekly - 2 sets - 30 seconds hold   GOALS: Goals reviewed with patient? Yes  SHORT TERM GOALS: Target date: 09/12/24  Patient will be independent in home exercise program to improve strength/mobility for better functional independence with ADLs. Baseline: initiated on 03/07/2024, updated on 03/28/2024 Goal status: IN PROGRESS   LONG TERM GOALS: Target date: 10/24/2024   Patient will increase ABC scale score >80% to demonstrate better functional mobility and better confidence with ADLs.  Baseline: 58.75% 05/09/2024: 68.125% 08/01/24: 63.75% 08/29/24: 68.43% Goal status: IN PROGRESS  2.  Patient will complete five times sit to stand test in < 10 seconds indicating an increased LE strength and improved balance. Baseline: 13 sec 05/09/2024: 12.08 seconds with arms across chest 08/01/24: 11.06 sec without UE support 08/29/24: 8.89 sec without UE support Goal status: MET  3.  Patient will increase Berg Balance score to 45/56 points to demonstrate decreased fall risk during functional  activities Baseline: 02/29/2024: 36/56 05/09/2024: 36/56  08/01/24: 41/56 08/29/24: 44/56 Goal status: REVISED  4.  Patient will increase 10 meter walk test to >1.25m/s as to improve gait speed for better community ambulation and to reduce fall risk. Baseline: 0.61 m/s with [facility] 05/09/2024: Average Normal speed: 0.76 m/s & Average Fast speed: 0.89 m/s using hurricane 05/23/2024: Average Normal speed: 0.9165 m/s & Average Fast speed: 0.90 m/s using hurricane with SBA   08/01/24: Average Normal speed: 0.8 m/s with cane hovering; Average normal speed: 0.90 m/s with use of cane 08/29/24:  Average Normal speed: 0.78 m/s; Average Fast speed: 0.93 m/s with use of hurrycane, CGA; noted improvement in step length Goal status: IN PROGRESS  5.  Patient will reduce timed up and go to <11 seconds to reduce fall risk and demonstrate improved transfer/gait ability. Baseline: 19.5 sec no AD, 14 sec with [facility] 05/09/2024: 16.36 seconds using hurricane with SBA 08/01/24: 12.73 seconds with use of cane 08/29/24: Average: 16.03 seconds with hurrycane; improvement in step length Goal status: IN PROGRESS    ASSESSMENT:  CLINICAL IMPRESSION:   Pt arrives to session following about a 1 month hiatus from therapy, showing great intrinsic motivation for improvement in therapy session today. Pt emphasizes the importance of her staying consistently on the same therapist's schedule. Initial gait pattern into rehab gym showed short step length/shuffling gait pattern. Pt throughout session shows intermittent use of SPC, occasionally lifting it off the floor to ambulate with no AD. Pt reported first bout of walking with pivot turns resulted in RPE beginning at 5/10 rise to 9/10. Pt showed minor SoB throughout session and required seated/standing rest breaks occasionally, but remained motivated to continue and not waste  the session. Pt reported 8/10 on RPE scale following 3 bouts of cone weaving. Pt noted more difficulty with step ups  leading with R LE due to previous hip issues. Continued utilization of agility ladder today in order to provide visual cue for increased step length and encouragement of reciprocal gait pattern, which showed great benefit to patient throughout the activity. Verba cues given throughout for increased step length. Pt reported challenge with in/out/sidestepping activity, but showed improvement in rhythm and foot placement as activity continued. Pt required use of SPC during sidestepping agility ladder activity for stability following unsuccessful steps without AD, but was able to to ambulate independently using no AD during fwd laps of agility ladder. Noted minor tingling in R toes following session today. Pt will continue to benefit from skilled therapy to address remaining deficits in order to improve overall QoL and return to PLOF.    OBJECTIVE IMPAIRMENTS: Abnormal gait, decreased activity tolerance, decreased balance, decreased mobility, difficulty walking, decreased ROM, decreased strength, dizziness, impaired sensation, impaired UE functional use, improper body mechanics, postural dysfunction, and pain.   ACTIVITY LIMITATIONS: carrying, lifting, bending, standing, squatting, stairs, transfers, bed mobility, bathing, and locomotion level  PARTICIPATION LIMITATIONS: meal prep, cleaning, shopping, community activity, occupation, and yard work  PERSONAL FACTORS: Age, Fitness, Sex, Time since onset of injury/illness/exacerbation, and 3+ comorbidities: Per chart PMH significant for breast CA (R), hx hip fx, hx alcoholism, depression, B12 deficiency, Vitamin D  deficiency, osteoporosis without current pathological fx, mass of upper quadrant L breast, closed comminuted intertrochanteric fx of proximal femur, atherosclerosis of aorta are also affecting patient's functional outcome.   REHAB POTENTIAL: Good  CLINICAL DECISION MAKING: Evolving/moderate complexity  EVALUATION COMPLEXITY: Moderate  PLAN:  PT  FREQUENCY: 1-2x/week  PT DURATION: 12 weeks  PLANNED INTERVENTIONS: 97164- PT Re-evaluation, 97750- Physical Performance Testing, 97110-Therapeutic exercises, 97530- Therapeutic activity, [id number]- Neuromuscular re-education, 97535- Self Care, [id number]- Manual therapy, [id number]- Gait training, [phone]- Orthotic Initial, [phone]- Orthotic/Prosthetic subsequent, [phone]- Canalith repositioning, [phone]- Splinting, [phone]- Electrical stimulation (manual), Patient/Family education, Balance training, Stair training, Taping, Joint mobilization, Spinal mobilization, Vestibular training, DME instructions, Wheelchair mobility training, Cryotherapy, and Moist heat   PLAN FOR NEXT SESSION:  - review HEP - standing balance:  - reaching activities   - turning 360 degrees!  - alternating foot taps   - progression to semi-tandem  - fwd/back & side stepping over obstacles - dynamic gait training  - continue treadmill training*  - need to include turning more   - continue figure 8 gait  - continue use of agility ladder  - focus on heel strike  - include scanning of environment   Renna Helling, SPT  8:06 AM 09/26/24

## 2024-10-03 ENCOUNTER — Ambulatory Visit: Admitting: Physical Therapy

## 2024-10-03 ENCOUNTER — Inpatient Hospital Stay: Admission: RE | Admit: 2024-10-03 | Discharge: 2024-10-03 | Attending: Internal Medicine

## 2024-10-03 DIAGNOSIS — R928 Other abnormal and inconclusive findings on diagnostic imaging of breast: Secondary | ICD-10-CM

## 2024-10-03 DIAGNOSIS — D242 Benign neoplasm of left breast: Secondary | ICD-10-CM | POA: Diagnosis not present

## 2024-10-03 MED ORDER — LIDOCAINE 1 % OPTIME INJ - NO CHARGE
5.0000 mL | Freq: Once | INTRAMUSCULAR | Status: AC
  Administered 2024-10-03: 5 mL
  Filled 2024-10-03: qty 6

## 2024-10-03 MED ORDER — LIDOCAINE-EPINEPHRINE 1 %-1:100000 IJ SOLN
20.0000 mL | Freq: Once | INTRAMUSCULAR | Status: AC
  Administered 2024-10-03: 20 mL
  Filled 2024-10-03: qty 20

## 2024-10-04 LAB — SURGICAL PATHOLOGY

## 2024-10-17 ENCOUNTER — Ambulatory Visit: Admitting: Physical Therapy

## 2024-10-24 ENCOUNTER — Ambulatory Visit: Admitting: Physical Therapy

## 2024-10-24 DIAGNOSIS — R262 Difficulty in walking, not elsewhere classified: Secondary | ICD-10-CM

## 2024-10-24 DIAGNOSIS — R2681 Unsteadiness on feet: Secondary | ICD-10-CM

## 2024-10-24 DIAGNOSIS — R42 Dizziness and giddiness: Secondary | ICD-10-CM | POA: Diagnosis not present

## 2024-10-24 DIAGNOSIS — M6281 Muscle weakness (generalized): Secondary | ICD-10-CM
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
## 2024-10-27 ENCOUNTER — Encounter: Payer: Self-pay | Admitting: Hematology and Oncology

## 2024-10-31 ENCOUNTER — Ambulatory Visit: Attending: Neurology | Admitting: Physical Therapy

## 2024-10-31 DIAGNOSIS — R262 Difficulty in walking, not elsewhere classified: Secondary | ICD-10-CM | POA: Insufficient documentation

## 2024-10-31 DIAGNOSIS — M6281 Muscle weakness (generalized): Secondary | ICD-10-CM | POA: Diagnosis present

## 2024-10-31 DIAGNOSIS — R2681 Unsteadiness on feet: Secondary | ICD-10-CM | POA: Insufficient documentation

## 2024-10-31 DIAGNOSIS — R42 Dizziness and giddiness: Secondary | ICD-10-CM | POA: Insufficient documentation
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
## 2024-11-07 ENCOUNTER — Ambulatory Visit

## 2024-11-07 DIAGNOSIS — M6281 Muscle weakness (generalized): Secondary | ICD-10-CM

## 2024-11-07 DIAGNOSIS — R262 Difficulty in walking, not elsewhere classified: Secondary | ICD-10-CM

## 2024-11-07 DIAGNOSIS — R2681 Unsteadiness on feet: Secondary | ICD-10-CM

## 2024-11-07 DIAGNOSIS — R42 Dizziness and giddiness: Secondary | ICD-10-CM
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
## 2024-11-14 ENCOUNTER — Ambulatory Visit: Admitting: Physical Therapy

## 2024-11-14 DIAGNOSIS — R2681 Unsteadiness on feet: Secondary | ICD-10-CM

## 2024-11-14 DIAGNOSIS — M6281 Muscle weakness (generalized): Secondary | ICD-10-CM

## 2024-11-14 DIAGNOSIS — R42 Dizziness and giddiness: Secondary | ICD-10-CM

## 2024-11-14 DIAGNOSIS — R262 Difficulty in walking, not elsewhere classified: Secondary | ICD-10-CM
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
## 2024-11-21 ENCOUNTER — Ambulatory Visit: Admitting: Urology

## 2024-11-21 ENCOUNTER — Ambulatory Visit: Admitting: Physical Therapy

## 2024-11-28 ENCOUNTER — Ambulatory Visit: Admitting: Physical Therapy

## 2024-12-05 ENCOUNTER — Ambulatory Visit: Admitting: Physical Therapy

## 2025-01-16 ENCOUNTER — Ambulatory Visit: Admitting: Physical Therapy

## 2025-01-23 ENCOUNTER — Ambulatory Visit: Admitting: Physical Therapy

## 2025-01-23 ENCOUNTER — Ambulatory Visit: Admitting: Podiatry

## 2025-01-23 ENCOUNTER — Ambulatory Visit: Admitting: Urology

## 2025-01-30 ENCOUNTER — Ambulatory Visit: Admitting: Physical Therapy

## 2025-02-06 ENCOUNTER — Ambulatory Visit: Admitting: Physical Therapy

## 2025-02-13 ENCOUNTER — Ambulatory Visit: Admitting: Physical Therapy

## 2025-03-13 ENCOUNTER — Ambulatory Visit: Admitting: Internal Medicine

## 2025-03-24 ENCOUNTER — Ambulatory Visit

## 2025-03-30 ENCOUNTER — Ambulatory Visit
